# Patient Record
Sex: Male | Born: 1935
Health system: Southern US, Community
[De-identification: ages and names within clinical notes are randomized; demographics above are authoritative.]

## PROBLEM LIST (undated history)

## (undated) DIAGNOSIS — I679 Cerebrovascular disease, unspecified: Secondary | ICD-10-CM

## (undated) DIAGNOSIS — I10 Essential (primary) hypertension: Secondary | ICD-10-CM

## (undated) DIAGNOSIS — F17201 Nicotine dependence, unspecified, in remission: Secondary | ICD-10-CM

## (undated) DIAGNOSIS — N189 Chronic kidney disease, unspecified: Secondary | ICD-10-CM

## (undated) DIAGNOSIS — I2510 Atherosclerotic heart disease of native coronary artery without angina pectoris: Secondary | ICD-10-CM

## (undated) DIAGNOSIS — E785 Hyperlipidemia, unspecified: Secondary | ICD-10-CM

## (undated) DIAGNOSIS — I350 Nonrheumatic aortic (valve) stenosis: Secondary | ICD-10-CM

## (undated) DIAGNOSIS — E119 Type 2 diabetes mellitus without complications: Secondary | ICD-10-CM

## (undated) HISTORY — PX: KNEE ARTHROSCOPY: SUR90

## (undated) HISTORY — PX: TONSILLECTOMY: SUR1361

## (undated) HISTORY — PX: SHOULDER SURGERY: SHX246

## (undated) HISTORY — DX: Atherosclerotic heart disease of native coronary artery without angina pectoris: I25.10

## (undated) HISTORY — PX: CARPAL TUNNEL RELEASE: SHX101

## (undated) HISTORY — DX: Type 2 diabetes mellitus without complications: E11.9

## (undated) HISTORY — DX: Nicotine dependence, unspecified, in remission: F17.201

## (undated) HISTORY — DX: Essential (primary) hypertension: I10

## (undated) HISTORY — PX: TRANSURETHRAL INCISION OF PROSTATE: SHX2573

## (undated) HISTORY — PX: APPENDECTOMY: SHX54

## (undated) HISTORY — DX: Hyperlipidemia, unspecified: E78.5

## (undated) HISTORY — DX: Cerebrovascular disease, unspecified: I67.9

---

## 1998-12-04 ENCOUNTER — Ambulatory Visit: Admission: RE | Admit: 1998-12-04 | Discharge: 1998-12-04 | Payer: Self-pay | Admitting: Pulmonary Disease

## 2000-10-14 ENCOUNTER — Encounter: Admission: RE | Admit: 2000-10-14 | Discharge: 2000-10-14 | Payer: Self-pay

## 2000-10-15 ENCOUNTER — Encounter: Admission: RE | Admit: 2000-10-15 | Discharge: 2000-10-15 | Payer: Self-pay

## 2001-05-01 ENCOUNTER — Encounter: Payer: Self-pay | Admitting: Orthopedic Surgery

## 2001-05-05 ENCOUNTER — Ambulatory Visit (HOSPITAL_COMMUNITY): Admission: RE | Admit: 2001-05-05 | Discharge: 2001-05-05 | Payer: Self-pay | Admitting: Orthopedic Surgery

## 2002-11-22 ENCOUNTER — Other Ambulatory Visit: Admission: RE | Admit: 2002-11-22 | Discharge: 2002-11-22 | Payer: Self-pay | Admitting: Dermatology

## 2003-05-22 ENCOUNTER — Other Ambulatory Visit: Admission: RE | Admit: 2003-05-22 | Discharge: 2003-05-22 | Payer: Self-pay | Admitting: Dermatology

## 2003-08-22 ENCOUNTER — Emergency Department (HOSPITAL_COMMUNITY): Admission: EM | Admit: 2003-08-22 | Discharge: 2003-08-23 | Payer: Self-pay | Admitting: *Deleted

## 2003-09-26 ENCOUNTER — Other Ambulatory Visit: Admission: RE | Admit: 2003-09-26 | Discharge: 2003-09-26 | Payer: Self-pay | Admitting: Dermatology

## 2004-11-13 ENCOUNTER — Ambulatory Visit: Payer: Self-pay | Admitting: *Deleted

## 2004-11-20 ENCOUNTER — Ambulatory Visit: Payer: Self-pay | Admitting: *Deleted

## 2004-11-20 ENCOUNTER — Encounter (HOSPITAL_COMMUNITY): Admission: RE | Admit: 2004-11-20 | Discharge: 2004-12-20 | Payer: Self-pay | Admitting: *Deleted

## 2004-11-26 ENCOUNTER — Ambulatory Visit (HOSPITAL_COMMUNITY): Admission: RE | Admit: 2004-11-26 | Discharge: 2004-11-26 | Payer: Self-pay | Admitting: *Deleted

## 2004-11-26 ENCOUNTER — Ambulatory Visit: Payer: Self-pay | Admitting: *Deleted

## 2004-12-02 ENCOUNTER — Inpatient Hospital Stay (HOSPITAL_BASED_OUTPATIENT_CLINIC_OR_DEPARTMENT_OTHER): Admission: RE | Admit: 2004-12-02 | Discharge: 2004-12-02 | Payer: Self-pay | Admitting: *Deleted

## 2004-12-02 ENCOUNTER — Ambulatory Visit: Payer: Self-pay | Admitting: Cardiology

## 2004-12-15 ENCOUNTER — Ambulatory Visit: Payer: Self-pay | Admitting: *Deleted

## 2005-03-05 ENCOUNTER — Ambulatory Visit (HOSPITAL_COMMUNITY): Admission: RE | Admit: 2005-03-05 | Discharge: 2005-03-05 | Payer: Self-pay | Admitting: Family Medicine

## 2006-08-26 ENCOUNTER — Ambulatory Visit (HOSPITAL_COMMUNITY): Admission: RE | Admit: 2006-08-26 | Discharge: 2006-08-26 | Payer: Self-pay | Admitting: Internal Medicine

## 2007-12-31 ENCOUNTER — Emergency Department (HOSPITAL_COMMUNITY): Admission: EM | Admit: 2007-12-31 | Discharge: 2008-01-01 | Payer: Self-pay | Admitting: Emergency Medicine

## 2008-01-26 ENCOUNTER — Emergency Department (HOSPITAL_COMMUNITY): Admission: EM | Admit: 2008-01-26 | Discharge: 2008-01-27 | Payer: Self-pay | Admitting: Emergency Medicine

## 2008-08-13 ENCOUNTER — Ambulatory Visit: Payer: Self-pay | Admitting: Cardiology

## 2008-08-19 ENCOUNTER — Encounter: Payer: Self-pay | Admitting: Cardiology

## 2008-08-19 ENCOUNTER — Ambulatory Visit: Payer: Self-pay | Admitting: Cardiology

## 2008-08-19 ENCOUNTER — Ambulatory Visit (HOSPITAL_COMMUNITY): Admission: RE | Admit: 2008-08-19 | Discharge: 2008-08-19 | Payer: Self-pay | Admitting: Cardiology

## 2008-09-02 ENCOUNTER — Ambulatory Visit (HOSPITAL_COMMUNITY): Admission: RE | Admit: 2008-09-02 | Discharge: 2008-09-02 | Payer: Self-pay | Admitting: Cardiology

## 2008-09-02 ENCOUNTER — Ambulatory Visit: Payer: Self-pay | Admitting: Cardiology

## 2008-09-24 ENCOUNTER — Ambulatory Visit (HOSPITAL_COMMUNITY): Admission: RE | Admit: 2008-09-24 | Discharge: 2008-09-24 | Payer: Self-pay | Admitting: Cardiology

## 2008-11-27 ENCOUNTER — Telehealth: Payer: Self-pay | Admitting: Cardiology

## 2008-12-09 DIAGNOSIS — E785 Hyperlipidemia, unspecified: Secondary | ICD-10-CM

## 2008-12-09 DIAGNOSIS — I10 Essential (primary) hypertension: Secondary | ICD-10-CM

## 2008-12-11 ENCOUNTER — Ambulatory Visit: Payer: Self-pay | Admitting: Cardiology

## 2009-01-06 ENCOUNTER — Encounter: Payer: Self-pay | Admitting: Cardiology

## 2009-01-06 ENCOUNTER — Encounter (INDEPENDENT_AMBULATORY_CARE_PROVIDER_SITE_OTHER): Payer: Self-pay | Admitting: *Deleted

## 2009-01-06 LAB — CONVERTED CEMR LAB
Cholesterol: 250 mg/dL
HDL: 39 mg/dL
HDL: 39 mg/dL — ABNORMAL LOW (ref >39)
LDL Cholesterol: 132 mg/dL
Triglycerides: 396 mg/dL
Triglycerides: 396 mg/dL — ABNORMAL HIGH (ref <150)

## 2009-01-13 ENCOUNTER — Encounter (INDEPENDENT_AMBULATORY_CARE_PROVIDER_SITE_OTHER): Payer: Self-pay | Admitting: *Deleted

## 2009-06-02 LAB — CONVERTED CEMR LAB
ALT: 49 units/L
CO2: 30 meq/L
Chloride: 100 meq/L
Cholesterol: 266 mg/dL
HDL: 36 mg/dL
LDL Cholesterol: 161 mg/dL
Potassium: 4.3 meq/L
Total Protein: 7 g/dL
Triglycerides: 346 mg/dL

## 2009-08-26 ENCOUNTER — Encounter (INDEPENDENT_AMBULATORY_CARE_PROVIDER_SITE_OTHER): Payer: Self-pay | Admitting: *Deleted

## 2009-08-28 ENCOUNTER — Ambulatory Visit: Payer: Self-pay | Admitting: Cardiology

## 2009-08-28 ENCOUNTER — Encounter: Payer: Self-pay | Admitting: Cardiology

## 2009-08-28 ENCOUNTER — Encounter (INDEPENDENT_AMBULATORY_CARE_PROVIDER_SITE_OTHER): Payer: Self-pay | Admitting: *Deleted

## 2009-08-28 DIAGNOSIS — I2510 Atherosclerotic heart disease of native coronary artery without angina pectoris: Secondary | ICD-10-CM

## 2009-09-09 ENCOUNTER — Ambulatory Visit (HOSPITAL_COMMUNITY)
Admission: RE | Admit: 2009-09-09 | Discharge: 2009-09-09 | Payer: Self-pay | Source: Home / Self Care | Admitting: Cardiology

## 2009-09-29 ENCOUNTER — Encounter: Payer: Self-pay | Admitting: Cardiology

## 2009-09-29 LAB — CONVERTED CEMR LAB
HDL: 32 mg/dL — ABNORMAL LOW (ref >39)
LDL Cholesterol: 49 mg/dL (ref 0–99)
Total CHOL/HDL Ratio: 4.8
Triglycerides: 369 mg/dL — ABNORMAL HIGH (ref <150)
VLDL: 74 mg/dL — ABNORMAL HIGH (ref 0–40)

## 2009-10-02 ENCOUNTER — Encounter (INDEPENDENT_AMBULATORY_CARE_PROVIDER_SITE_OTHER): Payer: Self-pay | Admitting: *Deleted

## 2010-05-19 ENCOUNTER — Ambulatory Visit: Payer: Self-pay | Admitting: Internal Medicine

## 2010-06-18 ENCOUNTER — Ambulatory Visit (HOSPITAL_COMMUNITY)
Admission: RE | Admit: 2010-06-18 | Discharge: 2010-06-18 | Payer: Self-pay | Source: Home / Self Care | Attending: Internal Medicine | Admitting: Internal Medicine

## 2010-06-18 ENCOUNTER — Ambulatory Visit: Payer: Self-pay | Admitting: Internal Medicine

## 2010-07-21 NOTE — Assessment & Plan Note (Signed)
Summary: 8 MTH F/U PER CHECKOUT ON 12/11/08/TG  Medications Added PRILOSEC 20 MG CPDR (OMEPRAZOLE) Take 1 tablet by mouth once a day WELCHOL 625 MG TABS (COLESEVELAM HCL) take 3 tablets two times a day TOPROL XL 50 MG XR24H-TAB (METOPROLOL SUCCINATE) Take 1 tablet by mouth once a day ASPIRIN 325 MG TABS (ASPIRIN) Take 1 tablet by mouth once a day POLYETHYLENE GLYCOL 1000  POWD (POLYETHYLENE GLYCOL 1000) 17 grams in 8 oz water daily GLUCOPHAGE 500 MG TABS (METFORMIN HCL) Take 1 tablet by mouth two times a day VITAMIN E 1000 UNIT CAPS (VITAMIN E) take one tablet weekly CRANBERRY 500 MG CAPS (CRANBERRY) Take 1 tablet by mouth two times a day VITAMIN D3 400 UNIT TABS (CHOLECALCIFEROL) Take 1 tablet by mouth once a day COQ10 200 MG CAPS (COENZYME Q10) Take 1 tablet by mouth once a day VITAMIN C CR 1500 MG CR-TABS (ASCORBIC ACID) Take 1 tablet by mouth once a day AMLODIPINE BESY-BENAZEPRIL HCL 5-20 MG CAPS (AMLODIPINE BESY-BENAZEPRIL HCL) Take 1 tablet by mouth once a day TEKTURNA HCT 300-12.5 MG TABS (ALISKIREN-HYDROCHLOROTHIAZIDE) Take 1 tablet by mouth once a day ZETIA 10 MG TABS (EZETIMIBE) Take one tablet by mouth daily.      Allergies Added: ! NIACIN  Visit Type:  8 month follow up Primary Provider:  Dr. Delphina Cahill   History of Present Illness: Return visit for this very pleasant 77 year old gentleman with mild coronary artery disease as of catheterization in 2006.  Since his last visit, he has done well from a cardiovascular standpoint.  He has very mild chronic dyspnea on exertion.  He notes no chest discomfort.  He continues to work in a [employer] on a part-time basis without problems except for an injury he recently suffered to his right third and fourth fingers.  He has noted no edema and has had no orthopnea nor PND.  Recent lipid profile was still uncontrolled despite treatment with WelChol.  He is statin intolerant.  -  Date:  08/28/2009    PSA .46   Current Medications  (verified): 1)  Prilosec 20 Mg Cpdr (Omeprazole) .... Take 1 Tablet By Mouth Once A Day 2)  Welchol 625 Mg Tabs (Colesevelam Hcl) .... Take 3 Tablets Two Times A Day 3)  Toprol Xl 50 Mg Xr24h-Tab (Metoprolol Succinate) .... Take 1 Tablet By Mouth Once A Day 4)  Aspirin 325 Mg Tabs (Aspirin) .... Take 1 Tablet By Mouth Once A Day 5)  Polyethylene Glycol 1000  Powd (Polyethylene Glycol 1000) .Marland KitchenMarland KitchenMarland Kitchen 17 Grams in 8 Oz Water Daily 6)  Glucophage 500 Mg Tabs (Metformin Hcl) .... Take 1 Tablet By Mouth Two Times A Day 7)  Vitamin E 1000 Unit Caps (Vitamin E) .... Take One Tablet Weekly 8)  Cranberry 500 Mg Caps (Cranberry) .... Take 1 Tablet By Mouth Two Times A Day 9)  Vitamin D3 400 Unit Tabs (Cholecalciferol) .... Take 1 Tablet By Mouth Once A Day 10)  Coq10 200 Mg Caps (Coenzyme Q10) .... Take 1 Tablet By Mouth Once A Day 11)  Vitamin C Cr 1500 Mg Cr-Tabs (Ascorbic Acid) .... Take 1 Tablet By Mouth Once A Day 12)  Amlodipine Besy-Benazepril Hcl 5-20 Mg Caps (Amlodipine Besy-Benazepril Hcl) .... Take 1 Tablet By Mouth Once A Day 13)  Tekturna Hct 300-12.5 Mg Tabs (Aliskiren-Hydrochlorothiazide) .... Take 1 Tablet By Mouth Once A Day 14)  Zetia 10 Mg Tabs (Ezetimibe) .... Take One Tablet By Mouth Daily.  Allergies (verified): 1)  ! Pravachol 2)  !  Vytorin (Ezetimibe-Simvastatin) 3)  ! Codeine 4)  ! Naprosyn 5)  ! Niacin  Past History:  PMH, FH, and Social History reviewed and updated.  Review of Systems       See history of present illness.  Vital Signs:  Patient profile:   77 year old male Height:      66.5 inches Weight:      186 pounds BMI:     29.68 O2 Sat:      96 % on Room air Pulse rate:   84 / minute BP sitting:   149 / 73  (left arm)  Vitals Entered By: Tye Savoy RN (August 28, 2009 10:55 AM)  O2 Flow:  Room air  Physical Exam  General:  Older, but very sharp gentleman, who is somewhat hard of hearing and in no acute distress. Weight is 186, 9 pounds more than at  his last visit. NECK:  No jugular venous distention; normal carotid upstrokes with soft bruits vs. transmitted murmur bilaterally LUNGS:  Clear with some prolongation of the expiratory phase. CARDIAC:  Normal first and second heart sounds; fourth heart sound present; grade 2/6 systolic ejection murmur at the cardiac base, but heard across the precordium ABDOMEN:  Soft and nontender; normal bowel sounds; no bruits; no organomegaly. EXTREMITIES:  trace pretibial edema; distal pulses intact; bandaged distal right third and fourth fingers. Neurologic: Normal strength and tone; normal cranial nerves Skin:  no significant lesions   Impression & Recommendations:  Problem # 1:  ATHEROSCLEROTIC CARDIOVASCULAR DISEASE (ICD-429.2) No symptoms to suggest myocardial ischemia.  No specific testing nor treatment is warranted at the present time.  Problem # 2:  HYPERTENSION (ICD-401.9) Blood pressure is slightly high in the office, the patient reports systolics only in the low 130s at home.  This represents adequate control for a diabetic.  Current medication will be continued.  Problem # 3:  HYPERLIPIDEMIA (B2193296.4) His degree of hyperlipidemia will be difficult to manage without statins.  Dr. Nevada Crane has increased his dose of WelChol.  Ezetimibe will be added at a dose of 10 mg q.d.  Lipids will be repeated in one month.  Problem # 4:  CEREBROVASCULAR DISEASE (ICD-437.9) Findings over his carotids likely represent transmitted murmur; however, he has no documented examination of anatomy.  A duplex study will be obtained.  Absent any major problems in his tests or new symptoms, I will plan to see this nice gentleman in one year.  Other Orders: Carotid Duplex (Carotid Duplex) Future Orders: T-Lipid Profile [id number]) ... 09/29/2009  Patient Instructions: 1)  Your physician recommends that you schedule a follow-up appointment in: 1 year 2)  Your physician recommends that you return for lab work in:  1 month 3)  Your physician has requested that you have a carotid duplex. This test is an ultrasound of the carotid arteries in your neck. It looks at blood flow through these arteries that supply the brain with blood. Allow one hour for this exam. There are no restrictions or special instructions. 4)  Your physician has recommended you make the following change in your medication: ZETIA 10MG  DAILY1

## 2010-07-21 NOTE — Letter (Signed)
Summary: [facility], general practice at [place]. [address], [place] [address]   Phone: [phone]  Fax: [phone]     August 28, 2009 MRN: [id number]   Anthony Murray [address] [place]  [address]      YOUR LAB WORK IS DUE   ____________APRIL 11, 2011_____________________________  Please go to [facility], located across the street from [facility] on the second floor.  Hours are Monday - Friday 7am until 7:30pm         Saturday 8am until 12noon    _X_  DO NOT EAT OR DRINK AFTER MIDNIGHT EVENING PRIOR TO LABWORK  __ YOUR LABWORK IS NOT FASTING --YOU MAY EAT PRIOR TO LABWORK

## 2010-07-21 NOTE — Letter (Signed)
Summary: [facility], general practice at [place]. [address] [place] [address]   Phone: [phone]  Fax: [phone]      October 02, 2009 MRN: [id number]   TOMMASO KER [address] [place], [place]  [address]   Dear Mr. Housman,  Your test ordered by Rande Lawman has been reviewed by your physician (or physician assistant) and was found to be normal or stable. Your physician (or physician assistant) felt no changes were needed at this time.  ____ Echocardiogram  ____ Cardiac Stress Test  __x__ Lab Work  ____ Peripheral vascular study of arms, legs or neck  ____ CT scan or X-ray  ____ Lung or Breathing test  ____ Other:  No change in medical treatment at this time, per Dr. Lattie Haw.  Enclosed is a copy of your labwork for your records.  Thank you, Jovanna Hodges [facility] RN    [name], MD, [facility] Gamer.C.Renella Cunas, MD, F.A.C.C Cristopher Peru, MD, F.A.C.C Rozann Lesches, MD, F.A.C.C Jenkins Rouge, MD, Le[facility] Gamer.C.C

## 2010-07-21 NOTE — Miscellaneous (Signed)
Summary: LABS LIPIDS,01/06/2009  Clinical Lists Changes  Observations: Added new observation of LDL: 132 mg/dL (01/06/2009 10:11) Added new observation of HDL: 39 mg/dL (01/06/2009 10:11) Added new observation of TRIGLYC TOT: 396 mg/dL (01/06/2009 10:11) Added new observation of CHOLESTEROL: 250 mg/dL (01/06/2009 10:11)

## 2010-09-09 ENCOUNTER — Ambulatory Visit: Payer: Self-pay | Admitting: Cardiology

## 2010-09-30 ENCOUNTER — Ambulatory Visit (INDEPENDENT_AMBULATORY_CARE_PROVIDER_SITE_OTHER): Payer: 59 | Admitting: Cardiology

## 2010-09-30 ENCOUNTER — Encounter: Payer: Self-pay | Admitting: Cardiology

## 2010-09-30 DIAGNOSIS — I10 Essential (primary) hypertension: Secondary | ICD-10-CM

## 2010-09-30 DIAGNOSIS — I2510 Atherosclerotic heart disease of native coronary artery without angina pectoris: Secondary | ICD-10-CM

## 2010-09-30 DIAGNOSIS — I679 Cerebrovascular disease, unspecified: Secondary | ICD-10-CM

## 2010-09-30 DIAGNOSIS — E785 Hyperlipidemia, unspecified: Secondary | ICD-10-CM

## 2010-09-30 DIAGNOSIS — E118 Type 2 diabetes mellitus with unspecified complications: Secondary | ICD-10-CM | POA: Insufficient documentation

## 2010-09-30 DIAGNOSIS — E119 Type 2 diabetes mellitus without complications: Secondary | ICD-10-CM

## 2010-09-30 DIAGNOSIS — F17201 Nicotine dependence, unspecified, in remission: Secondary | ICD-10-CM

## 2010-09-30 DIAGNOSIS — Z87891 Personal history of nicotine dependence: Secondary | ICD-10-CM

## 2010-09-30 LAB — BLOOD GAS, ARTERIAL
Acid-Base Excess: 0.4 mmol/L (ref 0.0–2.0)
Bicarbonate: 24.6 mEq/L — ABNORMAL HIGH (ref 20.0–24.0)
pCO2 arterial: 40.4 mmHg (ref 35.0–45.0)
pO2, Arterial: 80.6 mmHg (ref 80.0–100.0)

## 2010-09-30 NOTE — Assessment & Plan Note (Addendum)
No clinical evidence for progression of coronary disease over the past 6 years.  We will continue to optimally manage vascular risk factors.

## 2010-09-30 NOTE — Progress Notes (Signed)
HPI : Mr. Rossen returns to the office as scheduled for continued assessment and treatment of hypertension, vascular disease and cardiovascular risk factors.  Since his last visit, he has been generally well.  His level of activity is somewhat decreased, but he continues to garden and perform yardwork.  He denies dyspnea, orthopnea, PND, chest pain and syncope.  Current Outpatient Prescriptions on File Prior to Visit  Medication Sig Dispense Refill  . amLODipine-benazepril (LOTREL) 5-20 MG per capsule Take 1 capsule by mouth daily.        . Ascorbic Acid (VITAMIN C CR) 1500 MG TBCR Take 1 tablet by mouth daily.        Marland Kitchen aspirin 325 MG tablet Take 325 mg by mouth daily.        . Cholecalciferol (VITAMIN D3) 400 UNITS CAPS Take 1 capsule by mouth daily.        . Cranberry 500 MG CAPS Take 1 capsule by mouth daily.        . metFORMIN (GLUCOPHAGE) 500 MG tablet Take 500 mg by mouth 2 (two) times daily with a meal.        . metoprolol (TOPROL-XL) 50 MG 24 hr tablet Take 50 mg by mouth daily.        Marland Kitchen omeprazole (PRILOSEC) 20 MG capsule Take 20 mg by mouth daily.        . Polyethylene Glycol 1000 POWD by Does not apply route.        . vitamin E 1000 UNIT capsule Take 1,000 Units by mouth daily.        . Coenzyme Q10 (COQ10) 200 MG CAPS Take 1 capsule by mouth daily.        . colesevelam (WELCHOL) 625 MG tablet Take 1,875 mg by mouth daily.        Marland Kitchen ezetimibe (ZETIA) 10 MG tablet Take 10 mg by mouth daily.        Marland Kitchen DISCONTD: Aliskiren-Hydrochlorothiazide (TEKTURNA HCT) 300-12.5 MG TABS Take 1 tablet by mouth daily.           Allergies  Allergen Reactions  . Codeine   . Ezetimibe-Simvastatin     REACTION: myalgias  . Naproxen     REACTION: and all nonsteroidals  . Niacin   . Pravastatin Sodium     REACTION: myalgias no      Past medical history, social history, and family history reviewed and updated.  ROS: See history of present illness.  PHYSICAL EXAM: BP 138/67  Pulse 79  Ht 5\' 6"   (1.676 m)  Wt 179 lb (81.194 kg)  BMI 28.89 kg/m2  General-Well developed; no acute distress Body habitus-mildly overweight, but weight has decreased 7 pounds since his last visit Neck-No JVD; left greater than right carotid bruits vs. transmitted murmur Lungs-clear lung fields; resonant to percussion Cardiovascular-normal PMI; normal S1 and S2; grade 2/6 systolic ejection murmur at the cardiac base Abdomen-normal bowel sounds; soft and non-tender without masses or organomegaly Musculoskeletal-No deformities, no cyanosis or clubbing Neurologic-Normal cranial nerves; symmetric strength and tone Skin-Warm, no significant lesions Extremities-distal pulses intact; trace edema   ASSESSMENT AND PLAN:

## 2010-09-30 NOTE — Patient Instructions (Signed)
Your physician recommends that you schedule a follow-up appointment in 1 YEAR.  

## 2010-09-30 NOTE — Assessment & Plan Note (Addendum)
Hypertension has been well-controlled with current medical therapy.  A chemistry profile was checked 2 months ago with excellent results.  Patient has significant microalbuminemia and is receiving appropriate treatment with an ACE inhibitor.

## 2010-09-30 NOTE — Assessment & Plan Note (Addendum)
Lipid profile was excellent one year ago with total cholesterol 155, triglycerides of 369, HDL 32 and LDL of 49.   More recently, suboptimal values were obtained by Dr. Nevada Crane with total cholesterol of 225, triglycerides of 422, HDL of 29 and LDL unable to be calculated.  Patient reports that WelChol and ezetimibe both caused myalgias prompting him to stop taking them.  It does not appear that we will be able to maintain pharmacologic therapy.

## 2010-09-30 NOTE — Assessment & Plan Note (Signed)
Physical findings in neck may well reflect transmitted murmur rather than cerebrovascular disease.

## 2010-11-03 NOTE — Letter (Signed)
September 02, 2008    Delphina Cahill, MD  [address] [place],  [place] [address]   RE:  Anthony Murray, Anthony Murray  MRN:  [id number]  /  DOB:  June 18, 1928   Dear Thedore Mins,   Ms. Curley returns for continued assessment and treatment of exertional  dyspnea.  Since his last visit, he feels substantially better.  His  wheezing has resolved.  He still has dyspnea with moderate exertion and  maintains a low level of activity.   Medications are unchanged from his last visit except for either the  addition or the current recognition of metformin 500 mg b.i.d., vitamin  D daily.  Pravastatin and Symbicort have been added.  He has symptoms of  prostatism and has tried Flomax and terazosin and without benefit.  She  does well on Uroxatral, but his insurance company refuses to pay for.  He has had no further chest discomfort.   PHYSICAL EXAMINATION:  GENERAL:  Pleasant, trim gentleman in no acute  distress.  VITAL SIGNS:  The weight is 181, 3 pounds more than at his last visit.  Blood pressure 150/90, heart rate 80 and regular, respirations 16.  NECK:  No jugular venous distention; no carotid bruits.  LUNGS:  Clear with modest prolongation of the expiratory phase.  ABDOMEN:  Soft and nontender; no organomegaly; no bruits.  EXTREMITIES:  No edema.   IMPRESSION:  Mr. Baumhardt is doing generally well.  His exertional  dyspnea is likely due to physical deconditioning.  He does report  impaired exercise capacity for his entire life, which has been  attributed in the past to his prematurity.  This could have caused an  abnormality in lung development.  He also has a 30 pack-year history of  cigarette smoking.  We will proceed with a chest x-ray and pulmonary  function tests.  I will check a lipid profile in 1 month and plan a  return visit in 2 months.  You may wish to specifically request  Uroxatral from his insurance company with no substitutions so that they  will consider paying for it.  Blood pressure control is  slightly  suboptimal.  I would recommend that she increase amlodipine and/or  hydrochlorothiazide dose at his next visit.   Thanks so much for allowing me to share in the care of this very nice  gentleman and his wife.    Sincerely,      Cristopher Estimable. Lattie Haw, MD, [facility]  Electronically Signed    RMR/MedQ  DD: 09/02/2008  DT: 09/03/2008  Job #: [id number]

## 2010-11-03 NOTE — Letter (Signed)
December 11, 2008    Delphina Cahill, MD  [address] [place],  [place] [address]   RE:  NAOD, RUANE  MRN:  [id number]  /  DOB:  1927-10-18   Dear Thedore Mins:   Mr. Reppucci returns to the office for continued assessment and treatment  of mild coronary disease, at least when last assessed in 2006,  cardiovascular risk factors, and exertional dyspnea.  Since his last  visit, he has done well.  Remarkably, he still works, [employer].  He does not get much aerobic  exercise.  He still notes some dyspnea on exertion, but says this is  improved and tolerable.  He has had no chest discomfort.  He was unable  to take pravastatin due to myalgias.  He has had good control of  diabetes as assessed by hemoglobin A1c in your office; however,  capillary blood glucoses are 140 - 170 at home, and his triglycerides  have been high.   Current cardiac medications include:  1. Toprol 50 mg daily.  2. Aspirin 325 mg daily.  3. Fish oil 1000 mg daily.  4. Metformin 500 mg b.i.d.  5. Amlodipine/benazepril 5/20 mg daily.  6. Tekturna/HCT 300/12.5 mg daily.  7. Symbicort 1 inhalation b.i.d.   PHYSICAL EXAMINATION:  GENERAL:  Older, but very sharp gentleman, who is  somewhat hard of hearing and in no acute distress.  VITAL SIGNS:  The weight is 177, 4 pounds less than at his last visit.  Blood pressure 135/75, heart rate 68 and regular, respirations 12 and  unlabored.  NECK:  No jugular venous distention; normal carotid upstrokes without  bruits.  LUNGS:  Clear with some prolongation of the expiratory phase.  CARDIAC:  Normal first and second heart sounds; fourth heart sound  present.  ABDOMEN:  Soft and nontender; normal bowel sounds; no bruits; no  organomegaly.  EXTREMITIES:  1+ pretibial edema.   Chest x-ray was unremarkable.  PFTs showed relatively good lung function  with a normal arterial blood gas and normal flow parameters except at  low lung volumes.   Recent laboratory is from earlier this year showing a  normal CBC, normal TSH, and suboptimal lipid profile with total  cholesterol of 203, triglycerides of 400, HDL of 30, and LDL of 93.   IMPRESSION:  Mr. Gabin is doing well overall.  It appears that he will  not tolerate any statin.  We will start gemfibrozil 600 mg b.i.d.  I  emphasized to him that this does not cause muscle problems.  A lipid  profile will be repeated in 1 month.   Hypertension is as well controlled as I have seen it.  He will continue  his current medications.  I do not think that his dyspnea represents a  ischemic equivalent.  I have recommended that he try to perform aerobic  exercise on a daily basis.  Otherwise, he will follow up with you for  his other medical problems and plan to see me again in 8 months.    Sincerely,      Cristopher Estimable. Lattie Haw, MD, [facility]  Electronically Signed    RMR/MedQ  DD: 12/11/2008  DT: 12/12/2008  Job #: [id number]

## 2010-11-03 NOTE — Letter (Signed)
August 13, 2008    Anthony Murray, [address]  [place], [place] [address]   RE:  Anthony Murray, Anthony Murray  MRN:  [id number]  /  DOB:  October 11, 1927   Dear Anthony Murray,   It was my pleasure evaluating Anthony Murray in the office today in  consultation at your request for chest discomfort.  As you know, this  nice gentleman has been seen intermittently by a number of   cardiologists including Dr. Domenic Murray, Dr. Wilhemina Murray, and Dr. Albertine Murray.  He  has undergone coronary angiography on a number of occasions, most  recently in 2006 at which time he had a 60% LAD lesion and a 60%  circumflex lesion with a stress test suggesting critical right coronary  artery stenosis.  Medical therapy was advised.  He initially did well  and was not seen subsequently by any of our cardiologists.  In recent  weeks, he describes exertional chest discomfort.  This is a vaguely  characterized upper substernal ache, sometimes with radiation to the  left shoulder.  There is associated dyspnea, but he notes that he is  dyspneic all the time.  He has apparently been seen by our pulmonologist  in the past with no specific diagnosis identified.  He has smoked  cigarettes in the past, but not since 1971.  He has no known lung  conditions.  He does have hypertension that has been well controlled  medically.  He has dyslipidemia, but developed an adverse reaction to  Vytorin and is currently under no pharmacologic therapy.   PAST MEDICAL HISTORY:  Otherwise, notable for a diagnosis of diabetes  that is treated with diet.   ALLERGIES:  He reports an allergy to CODEINE and NONSTEROIDALS.   CURRENT MEDICATIONS:  1. Aspirin 325 mg daily.  2. Nortriptyline 50 mg daily.  3. Fish oil 1000 mg daily.  4. Toprol 50 mg daily.  5. Omeprazole 20 mg daily.  6. Amlodipine/benazepril 5/20 mg daily.  7. Coenzyme Q10.  8. Tekturna HCT 300/12.5 mg daily.   SOCIAL HISTORY:  Retired; married and lives locally; 4 adult children.  No  excessive use of alcohol.   FAMILY HISTORY:  No prominent coronary artery disease.   REVIEW OF SYSTEMS:  Notable for the need for corrective lenses, prior  bilateral cataract surgery, some hearing impairment, a history of heart  murmur, a history of jaundice, and arthritic discomfort.  All other  systems reviewed and are negative.   On exam, pleasant, proportionate gentleman in no acute distress.  The weight is 185.  Blood pressure 130/75, heart rate 80 and regular,  respirations 12 and unlabored.  NECK:  No jugular venous distention; faint early carotid bruits.  CARDIAC:  Normal first and second heart sounds.  Minimal early systolic  murmur.  ABDOMEN:  No bruits; normal bowel sounds; no organomegaly.  EXTREMITIES:  No edema.  NEUROLOGIC:  Symmetric strength and tone; normal cranial nerves.   Most recent laboratories are from mid 2009, at which time chemistry  profile was normal, A1c was normal, LFTs were normal, and lipid profile  was suboptimal with total cholesterol of 238, triglycerides of 263, HDL  of 37, and LDL of 149.   IMPRESSION:  Anthony Murray has impressive symptoms that are certainly  consistent with exertional angina; however, he has had multiple  evaluations in the past for similar chest discomfort, all without  requiring revascularizations.  Accordingly, I believe a stress test is  the appropriate in  initial study.  He had a bad experience with a  treadmill test many years ago, but is willing to try again.  We will set  that study up at the time convenient to him.   He is on a fairly good anti-ischemic regime with amlodipine and  metoprolol.  We will not titrate those drugs for the time being.   He has known coronary artery disease and probable cerebrovascular  disease and merits lipid lowering therapy.  We will try a low dose of  pravastatin every other day to determine if he can tolerate any statin.  I will see this nice gentleman again after a stress test has  been  completed.  Thank so much for sending him back to [place] for reevaluation.    Sincerely,      Anthony Estimable. Lattie Haw, MD, [facility]  Electronically Signed    RMR/MedQ  DD: 08/13/2008  DT: 08/14/2008  Job #: [id number]

## 2010-11-03 NOTE — Procedures (Signed)
NAMESAYEED, LIPE NO.:  [id number]   MEDICAL RECORD NO.:  [id number]          PATIENT TYPE:  OUT   LOCATION:  RAD                           FACILITY:  [facility]   PHYSICIAN:  Cristopher Estimable. Lattie Haw, MD, FACCDATE OF BIRTH:  [birth year]   DATE OF PROCEDURE:  08/19/2008  DATE OF DISCHARGE:                                ECHOCARDIOGRAM   REFERRING DOCTOR:  Delphina Cahill, MD   CLINICAL DATA:  A 77 year old gentleman with chest pain and multiple  cardiovascular risk factors.  1. Treadmill exercise performed to work load of 5 METs and a heart      rate of 131, 92% of age-predicted maximum.  Exercise discontinued      due to dyspnea; audible wheezing appreciated.  2. Blood pressure increased from resting value of 130/70 to 180/80      during exercise and 190/70 in recovery, a normal response.      Occasional PVCs occurred during exercise and moderately frequent      PVCs early in recovery.  3. EKG:  Normal sinus rhythm; borderline left atrial abnormality;      right ventricular conduction delay; delayed R-wave progression.   Stress EKG:  No significant change.  1. Baseline echocardiogram:  Normal left atrial size; normal mitral      and tricuspid valve; normal left ventricular size and function;      mild-to-moderate LVH.   Post-exercise echocardiogram:  Increase contractility in all myocardial  segments except for the proximal septum, which was unchanged.   IMPRESSION:  Probably negative stress echocardiogram revealed impaired  exercise capacity, a rapid increase in the heart rate at low level  exercise consistent with physical deconditioning, normal left  ventricular size, normal left ventricular systolic function and no  definite electrocardiographic nor echocardiographic evidence for  ischemia or infarction.      Cristopher Estimable. Lattie Haw, MD, [facility]  Electronically Signed     RMR/MEDQ  D:  08/20/2008  T:  08/20/2008  Job:  [id number]

## 2010-11-06 NOTE — Op Note (Signed)
[facility]  Patient:    Anthony Murray, Anthony Murray Visit Number: [id number] MRN: [id number]          Service Type: DSU Location: DAY Attending Physician:  Augustin Schooling. Dictated by:   Doran Heater Veverly Fells, M.D. Proc. Date: 05/05/01 Admit Date:  05/05/2001                             Operative Report  PREOPERATIVE DIAGNOSIS:  Left carpal tunnel syndrome.  POSTOPERATIVE DIAGNOSIS:  Left carpal tunnel syndrome.  PROCEDURE PERFORMED:  Left carpal tunnel release.  ATTENDING SURGEON:  Doran Heater. Veverly Fells, M.D.  ANESTHESIA:  Local plus MAC was used.  ESTIMATED BLOOD LOSS:  Minimal.  TOURNIQUET TIME:  18 minutes.  INSTRUMENT COUNT:  Correct.  COMPLICATIONS:  There were no complications.  FLUID REPLACEMENT:  400 cc of crystalloid.  INDICATIONS:  The patient is a 77 year old male who presents with long-standing left carpal tunnel symptoms.  He has numbness and tingling in his radial 3-1/2 digits.  This has been refractory to splinting.  He has significant night pain and interferes with activities of daily living.  After discussing the options with the patient, including continued nonoperative treatment versus surgical carpal tunnel release, he would like to have his carpal tunnel release.  An informed consent was signed and on the chart.  DESCRIPTION OF PROCEDURE:  After an adequate level of local anesthesia plus MAC was achieved, the patient was positioned supine on the operating table, and a nonsterile tourniquet was placed on the left proximal arm.  The left arm was then prepped and draped in its entirity in the usual sterile fashion.  An Esmarch bandage was used to exsanguinate the left upper extremity. The tourniquet was elevated to 250 mmHg.  A longitudinal incision was created in line with the fourth ray in the palm.  This was done using a 15 blade scalpel. Dissection was carried sharply through the subcutaneous tissues.  Using the knife, the  superficial palmar fascia was released in line with the skin incision.  Next, the transverse carpal ligament was identified and incised. This was done under loupe magnification.  The median nerve was protected during this portion of the case.  The operating surgeons finger was then placed into the palm to ensure complete release distally.  Care was taken to preserve the palmar arch blood supply.  Next, attention was directed proximally.  The transverse carpal ligament was released all to its junction with the ______ fascia, which was also incised, again with great care towards protecting the median nerve.  This was visualized during the entire portion of the procedure.  After complete release, the finger was then placed into forearm from the palmar incision. The wound was thoroughly irrigated and then closed using interrupted 4-0 nylon vertical mattress sutures.  A sterile dressing was applied, followed by a short arm splint with the wrist in slight dorsiflexion. The patient tolerated the procedure well and was taken to PACU in stable condition. Dictated by:   Doran Heater Veverly Fells, M.D. Attending Physician:  Esmond Plants R. DD:  05/05/01 TD:  05/05/01 Job: [id number]

## 2010-11-06 NOTE — Procedures (Signed)
NAMEBOHDEN, MAUPIN               ACCOUNT NO.:  [id number]   MEDICAL RECORD NO.:  [id number]           PATIENT TYPE:   LOCATION:                                 FACILITY:   PHYSICIAN:  Edward L. Luan Pulling, M.D.DATE OF BIRTH:  06/24/1927   DATE OF PROCEDURE:  DATE OF DISCHARGE:                            PULMONARY FUNCTION TEST   1. Spirometry shows no ventilatory defect, but does show some evidence      of airflow obstruction which is most marked in the smaller airways.  2. Lung volumes are normal.  3. DLCO is normal.  4. Arterial blood gases are normal.      Edward L. Luan Pulling, M.D.  Electronically Signed     ELH/MEDQ  D:  09/30/2008  T:  09/30/2008  Job:  [id number]   cc:   Cristopher Estimable. Lattie Haw, [place], [place]  [place], [place] [address]

## 2010-11-06 NOTE — Procedures (Signed)
NAME:  Anthony Murray, Anthony Murray NO.:  [id number]   MEDICAL RECORD NO.:  [id number]            PATIENT TYPE:   LOCATION:                                 FACILITY:   PHYSICIAN:  Scarlett Presto, M.D.   DATE OF BIRTH:  1928-06-02   DATE OF PROCEDURE:  11/20/2004  DATE OF DISCHARGE:                                    STRESS TEST   HISTORY OF PRESENT ILLNESS:  Anthony Murray is a 77 year old gentleman with  nonobstructive coronary artery disease by catheterization in 1999 or 2000.  He now presents with complaints of chest discomfort.   BASELINE DATA:  Electrocardiogram reveals a sinus bradycardia at 50 beats  per minute with some nonspecific ST abnormalities consistent with his  previous electrocardiogram done in the office Nov 13, 2004. Blood pressure  is 152/78.   Forty-five milligrams of adenosine was infused over 4 minutes protocol with  Myoview injected at 3 minutes. The patient reported chest tightness and  shortness of breath that resolved in recovery. EKG revealed sinus  bradycardia. No ischemic changes were noted.   Final images and results are pending M.D. review.      AB/MEDQ  D:  11/20/2004  T:  11/21/2004  Job:  [id number]

## 2010-11-06 NOTE — Cardiovascular Report (Signed)
[name], Anthony Murray               ACCOUNT NO.:  [id number]   MEDICAL RECORD NO.:  [id number]          PATIENT TYPE:  OIB   LOCATION:  6501                         FACILITY:  [facility]   PHYSICIAN:  Ethelle Lyon, M.D. LHCDATE OF BIRTH:  [birth year]   DATE OF PROCEDURE:  12/02/2004  DATE OF DISCHARGE:                              CARDIAC CATHETERIZATION   PROCEDURE:  Left heart catheterization, coronary angiography.   INDICATION:  [name] is a 77 year old gentleman with previously  documented nonobstructive coronary disease in the setting of hypertension,  dyslipidemia and diabetes mellitus.  He is stated to have dextrocardia in  the setting of situs inversus and previously placed coronary stent.  The  patient denies any prior cardiac stenting, in contrast to the written  record.  He has no symptoms of angina.  Does have some mild exertional  dyspnea.  He underwent adenosine Cardiolite in mid May demonstrating a  reversible inferior defect.  He was, therefore, referred for diagnostic  angiography.   PROCEDURAL TECHNIQUE:  Informed consent was obtained.  Under 1% lidocaine  local anesthesia, a 4 French sheath was placed in the right common femoral  artery using the modified Seldinger technique.  Diagnostic angiography and  left heart catheterization were performed using JL4, JR4, and pigtail  catheters.  Ventriculography was deferred due to renal insufficiency in the  setting of diabetes mellitus.  The patient tolerated the procedure well and  was transferred to the holding room in stable condition.  Sheaths will be  removed there.   COMPLICATIONS:  None.   FINDINGS:  1.  Patient does not have dextrocardia.  The heart is normally located in      the left side of the chest.  Aortic arch appears normal based on the      course of the catheter through it.  2.  LV 167/19/25.  3.  No aortic stenosis.  4.  Left main:  Angiographically normal.  5.  LAD:  Large vessel giving rise to  a single, very large, diagonal.  There      is a 50% stenosis of the ostium of the LAD.  6.  Circumflex:  Large vessel giving rise to three obtuse marginals.  There      is a 60% stenosis of the mid vessel.  7.  RCA:  Tortuous, dominant vessel.  It is angiographically normal.   IMPRESSION:  1.  No dextrocardia.  2.  No previously placed coronary stent.  3.  Moderate nonobstructive coronary disease.  4.  Elevated left ventricular end diastolic pressure in the setting of      systemic hypertension.   RECOMMENDATIONS:  Continue medical therapy with emphasis on control of his  blood pressure.       WED/MEDQ  D:  12/02/2004  T:  12/02/2004  Job:  [id number]   cc:   Scarlett Presto, M.D.  Fax: [phone]

## 2011-09-09 ENCOUNTER — Other Ambulatory Visit (HOSPITAL_COMMUNITY): Payer: Self-pay | Admitting: Internal Medicine

## 2011-09-09 ENCOUNTER — Ambulatory Visit (HOSPITAL_COMMUNITY)
Admission: RE | Admit: 2011-09-09 | Discharge: 2011-09-09 | Disposition: A | Discharge status: Home / Self Care | Payer: Medicare Other | Source: Ambulatory Visit | Attending: Internal Medicine | Admitting: Internal Medicine

## 2011-09-09 DIAGNOSIS — M79606 Pain in leg, unspecified: Secondary | ICD-10-CM

## 2011-09-09 DIAGNOSIS — I739 Peripheral vascular disease, unspecified: Secondary | ICD-10-CM

## 2011-09-09 DIAGNOSIS — M79609 Pain in unspecified limb: Secondary | ICD-10-CM | POA: Insufficient documentation

## 2011-10-05 ENCOUNTER — Ambulatory Visit (INDEPENDENT_AMBULATORY_CARE_PROVIDER_SITE_OTHER): Payer: Medicare Other | Admitting: Cardiology

## 2011-10-05 ENCOUNTER — Encounter: Payer: Self-pay | Admitting: Cardiology

## 2011-10-05 VITALS — BP 149/88 | HR 91 | Resp 18 | Ht 66.0 in | Wt 180.0 lb

## 2011-10-05 DIAGNOSIS — I679 Cerebrovascular disease, unspecified: Secondary | ICD-10-CM

## 2011-10-05 DIAGNOSIS — N189 Chronic kidney disease, unspecified: Secondary | ICD-10-CM

## 2011-10-05 DIAGNOSIS — I739 Peripheral vascular disease, unspecified: Secondary | ICD-10-CM

## 2011-10-05 DIAGNOSIS — E119 Type 2 diabetes mellitus without complications: Secondary | ICD-10-CM

## 2011-10-05 DIAGNOSIS — I10 Essential (primary) hypertension: Secondary | ICD-10-CM

## 2011-10-05 DIAGNOSIS — I2510 Atherosclerotic heart disease of native coronary artery without angina pectoris: Secondary | ICD-10-CM

## 2011-10-05 DIAGNOSIS — E785 Hyperlipidemia, unspecified: Secondary | ICD-10-CM

## 2011-10-05 MED ORDER — AMLODIPINE BESY-BENAZEPRIL HCL 5-20 MG PO CAPS: 1.0000 | ORAL_CAPSULE | Freq: Two times a day (BID) | ORAL | Status: AC

## 2011-10-05 NOTE — Assessment & Plan Note (Addendum)
No neurologic symptoms to suggest brain ischemia or infarction.  Risk factor control and treatment with aspirin will be continued.  Aspirin dose of 81 mg should be adequate.

## 2011-10-05 NOTE — Assessment & Plan Note (Signed)
No symptoms to suggest myocardial ischemia.  We will continue to endeavor to manage cardiovascular risk factors to the extent possible.

## 2011-10-05 NOTE — Patient Instructions (Signed)
Your physician recommends that you schedule a follow-up appointment in: 1 year  Your physician has recommended you make the following change in your medication:  1 - INCREASE Amlodipine/benazepril to 1 twice a day 2 - CHANGE Aspirin to 81 mg daily

## 2011-10-05 NOTE — Progress Notes (Deleted)
Name: Anthony Murray    DOB: [birth year]  Age: 78 y.o.  MR#: [id number]       PCP:  Wende Neighbors, MD, MD      Insurance: @PAYORNAME @   CC:    Chief Complaint  Patient presents with  . Appointment    SOB +med list    VS BP 149/88  Pulse 91  Resp 18  Ht 5\' 6"  (1.676 m)  Wt 180 lb (81.647 kg)  BMI 29.05 kg/m2  Weights Current Weight  10/05/11 180 lb (81.647 kg)  09/30/10 179 lb (81.194 kg)  08/28/09 186 lb (84.369 kg)    Blood Pressure  BP Readings from Last 3 Encounters:  10/05/11 149/88  09/30/10 138/67  08/28/09 149/73     Admit date:  (Not on file) Last encounter with RMR:  Visit date not found   Allergy Allergies  Allergen Reactions  . Codeine   . Ezetimibe-Simvastatin     REACTION: myalgias  . Naproxen     REACTION: and all nonsteroidals  . Niacin   . Pravastatin Sodium     REACTION: myalgias no    Current Outpatient Prescriptions  Medication Sig Dispense Refill  . amLODipine-benazepril (LOTREL) 5-20 MG per capsule Take 1 capsule by mouth daily.        Marland Kitchen aspirin 325 MG tablet Take 325 mg by mouth daily.        . Brimonidine Tartrate-Timolol (COMBIGAN OP) Apply 1 drop to eye 2 (two) times daily. 1 drop both eyes bid        . dorzolamide (TRUSOPT) 2 % ophthalmic solution Place 1 drop into the left eye 2 (two) times daily.        Marland Kitchen latanoprost (XALATAN) 0.005 % ophthalmic solution Place 1 drop into both eyes at bedtime.        Marland Kitchen losartan-hydrochlorothiazide (HYZAAR) 100-12.5 MG per tablet Take 1 tablet by mouth daily.        . metFORMIN (GLUCOPHAGE) 850 MG tablet Take 850 mg by mouth 2 (two) times daily with a meal.      . metoprolol (TOPROL-XL) 50 MG 24 hr tablet Take 50 mg by mouth daily.        Marland Kitchen omeprazole (PRILOSEC) 20 MG capsule Take 20 mg by mouth daily.          Discontinued Meds:    Medications Discontinued During This Encounter  Medication Reason  . metFORMIN (GLUCOPHAGE) 500 MG tablet Error  . Ascorbic Acid (VITAMIN C CR) 1500 MG TBCR Error  .  Cholecalciferol (VITAMIN D3) 400 UNITS CAPS Error  . Coenzyme Q10 (COQ10) 200 MG CAPS Error  . Cranberry 500 MG CAPS Error  . colesevelam (WELCHOL) 625 MG tablet Error  . ezetimibe (ZETIA) 10 MG tablet Error  . vitamin E 1000 UNIT capsule Error  . Polyethylene Glycol 1000 POWD Error    Patient Active Problem List  Diagnoses  . HYPERLIPIDEMIA  . HYPERTENSION  . ATHEROSCLEROTIC CARDIOVASCULAR DISEASE  . CEREBROVASCULAR DISEASE  . Tobacco abuse, in remission  . Diabetes mellitus type 2, controlled    LABS No visits with results within 3 Month(s) from this visit. Latest known visit with results is:  Hospital Outpatient Visit on 06/18/2010  Component Date Value  . Glucose-Capillary 06/18/2010 110*     Results for this Opt Visit:     Results for orders placed during the hospital encounter of 06/18/10  GLUCOSE, CAPILLARY      Component Value Range   Glucose-Capillary 110 (*) 70 -  99 (mg/dL)    EKG No orders found for this or any previous visit.   Prior Assessment and Plan Problem List as of 10/05/2011          Cardiology Problems   HYPERLIPIDEMIA   Last Assessment & Plan Note   09/30/2010 Office Visit Addendum 10/03/2010 11:14 PM by Yehuda Savannah, MD    Lipid profile was excellent one year ago with total cholesterol 155, triglycerides of 369, HDL 32 and LDL of 49.   More recently, suboptimal values were obtained by Dr. Nevada Crane with total cholesterol of 225, triglycerides of 422, HDL of 29 and LDL unable to be calculated.  Patient reports that WelChol and ezetimibe both caused myalgias prompting him to stop taking them.  It does not appear that we will be able to maintain pharmacologic therapy.    HYPERTENSION   Last Assessment & Plan Note   09/30/2010 Office Visit Addendum 10/03/2010 11:15 PM by Yehuda Savannah, MD    Hypertension has been well-controlled with current medical therapy.  A chemistry profile was checked 2 months ago with excellent results.  Patient has  significant microalbuminemia and is receiving appropriate treatment with an ACE inhibitor.     ATHEROSCLEROTIC CARDIOVASCULAR DISEASE   Last Assessment & Plan Note   09/30/2010 Office Visit Addendum 10/03/2010 11:13 PM by Yehuda Savannah, MD    No clinical evidence for progression of coronary disease over the past 6 years.  We will continue to optimally manage vascular risk factors.    CEREBROVASCULAR DISEASE   Last Assessment & Plan Note   09/30/2010 Office Visit Signed 09/30/2010  3:24 PM by Yehuda Savannah, MD    Physical findings in neck may well reflect transmitted murmur rather than cerebrovascular disease.      Other   Tobacco abuse, in remission   Diabetes mellitus type 2, controlled       Imaging: US Venous Img Lower Unilateral Right  09/09/2011  *RADIOLOGY REPORT*  Clinical Data: Right leg and calf pain  RIGHT LOWER EXTREMITY VENOUS DUPLEX ULTRASOUND  Technique:  Gray-scale sonography with graded compression, as well as color Doppler and duplex ultrasound, were performed to evaluate the deep venous system of the lower extremity from the level of the common femoral vein through the popliteal and proximal calf veins. Spectral Doppler was utilized to evaluate flow at rest and with distal augmentation maneuvers.  Comparison:  None  Findings: Deep venous system patent and compressible from right groin through popliteal fossa. Spontaneous venous flow present with intact augmentation and evidence of respiratory phasicity. No intraluminal thrombus identified. Visualized portion of the greater saphenous system unremarkable.  IMPRESSION: No evidence of deep venous thrombosis in the right lower extremity.  Original Report Authenticated By: Burnetta Sabin, M.D.   US Arterial Seg Single  09/09/2011  *RADIOLOGY REPORT*  Clinical Data: The patient  ULTRASOUND ANKLE/BRACHIAL INDICES BILATERAL  Comparison: None.  Findings: Right and left ankle brachial indices are 0.5 and 0.84 respectively.  Right  posterior tibial wave form is biphasic but the dorsalis pedis is monophasic.  The posterior tibial and dorsalis pedis wave forms at the left ankle are both monophasic in spite of increased pressure measurements.  IMPRESSION: There is significant bilateral lower extremity arterial occlusive disease.  It is probably more severe on the left despite an increased ankle brachial index and this may simply be due to calcified vessels.  Original Report Authenticated By: Jamas Lav, M.D.     [facility] Calculation: Score  not calculated

## 2011-10-05 NOTE — Assessment & Plan Note (Signed)
Blood pressure control is suboptimal.  Amlodipine/benazepril will be increased to 10/40 mg per day in divided doses.

## 2011-10-05 NOTE — Progress Notes (Signed)
Patient ID: Anthony Murray, male   DOB: 1928-04-04, 78 y.o.   MRN: [id number]  HPI: Scheduled return visit for this pleasant gentleman with coronary artery disease and multiple cardiovascular risk factors.  Since I last saw him, he has done quite well.  He has not required hospitalization or developed any new medical issues.  He was recently evaluated for right leg pain.  Venous ultrasound excluded thrombosis while arterial Doppler studies demonstrated obstructive disease.  Mr. Damme denies claudication; the discomfort in his right calf it is not related to activity and not relieved with rest.  Prior to Admission medications   Medication Sig Start Date End Date Taking? Authorizing Provider  aspirin 81 MG tablet Take 81 mg by mouth daily.   Yes Historical Provider, MD  Brimonidine Tartrate-Timolol (COMBIGAN OP) Apply 1 drop to eye 2 (two) times daily. 1 drop both eyes bid     Yes Historical Provider, MD  dorzolamide (TRUSOPT) 2 % ophthalmic solution Place 1 drop into the left eye 2 (two) times daily.     Yes Historical Provider, MD  latanoprost (XALATAN) 0.005 % ophthalmic solution Place 1 drop into both eyes at bedtime.     Yes Historical Provider, MD  losartan-hydrochlorothiazide (HYZAAR) 100-25 MG per tablet Take 1 tablet by mouth daily.   Yes Historical Provider, MD  metFORMIN (GLUCOPHAGE) 850 MG tablet Take 850 mg by mouth 2 (two) times daily with a meal.   Yes Historical Provider, MD  metoprolol (TOPROL-XL) 50 MG 24 hr tablet Take 50 mg by mouth daily.     Yes Historical Provider, MD  omeprazole (PRILOSEC) 20 MG capsule Take 20 mg by mouth daily.     Yes Historical Provider, MD  amLODipine-benazepril (LOTREL) 5-20 MG per capsule Take 1 capsule by mouth 2 (two) times daily. 10/05/11 10/04/12  Yehuda Savannah, MD    Allergies  Allergen Reactions  . Codeine   . Ezetimibe-Simvastatin     REACTION: myalgias  . Naproxen     REACTION: and all nonsteroidals  . Niacin   . Pravastatin Sodium     REACTION: myalgias no      Past medical history, social history, and family history reviewed and updated.  ROS: Denies chest discomfort, palpitations, lightheadedness or syncope.  He has noted mild edema that is not troublesome to him.  He has chronic class II dyspnea on exertion, but activity is limited, and he is rarely symptomatic.  All other systems reviewed and are negative.  PHYSICAL EXAM: BP 149/88  Pulse 91  Resp 18  Ht 5\' 6"  (1.676 m)  Wt 81.647 kg (180 lb)  BMI 29.05 kg/m2  General-Well developed; no acute distress Body habitus-Mildly overweight Neck-No JVD; Bilateral carotid bruits vs transmitted murmur Lungs-clear lung fields; resonant to percussion Cardiovascular-normal PMI; normal S1 and S2; grade 2/6 basilar systolic ejection murmur Abdomen-normal bowel sounds; soft and non-tender without masses or organomegaly Musculoskeletal-No deformities, no cyanosis or clubbing Neurologic-Normal cranial nerves; symmetric strength and tone Skin-Warm, no significant lesions Extremities-1-2+distal pulses on the left, minimal pulses on the right; trace edema   ASSESSMENT AND PLAN:  [name], MD 10/05/2011 2:49 PM

## 2011-10-10 DIAGNOSIS — I739 Peripheral vascular disease, unspecified: Secondary | ICD-10-CM | POA: Insufficient documentation

## 2011-10-10 NOTE — Progress Notes (Addendum)
Patient ID: Anthony Murray, male   DOB: 1928-01-25, 78 y.o.   MRN: [id number]  HPI: scheduled return visit for this nice gentleman with hypertension, vascular disease and cardiovascular risk factors.  Since his last visit, has done generally well.  He describes some leg discomfort, but this is not classic claudication.  He remains active, including performing some yard work.  Prior to Admission medications   Medication Sig Start Date End Date Taking? Authorizing Provider  aspirin 81 MG tablet Take 81 mg by mouth daily.   Yes Historical Provider, MD  Brimonidine Tartrate-Timolol (COMBIGAN OP) Apply 1 drop to eye 2 (two) times daily. 1 drop both eyes bid     Yes Historical Provider, MD  dorzolamide (TRUSOPT) 2 % ophthalmic solution Place 1 drop into the left eye 2 (two) times daily.     Yes Historical Provider, MD  latanoprost (XALATAN) 0.005 % ophthalmic solution Place 1 drop into both eyes at bedtime.     Yes Historical Provider, MD  losartan-hydrochlorothiazide (HYZAAR) 100-25 MG per tablet Take 1 tablet by mouth daily.   Yes Historical Provider, MD  metFORMIN (GLUCOPHAGE) 850 MG tablet Take 850 mg by mouth 2 (two) times daily with a meal.   Yes Historical Provider, MD  metoprolol (TOPROL-XL) 50 MG 24 hr tablet Take 50 mg by mouth daily.     Yes Historical Provider, MD  omeprazole (PRILOSEC) 20 MG capsule Take 20 mg by mouth daily.     Yes Historical Provider, MD  amLODipine-benazepril (LOTREL) 5-20 MG per capsule Take 1 capsule by mouth 2 (two) times daily. 10/05/11 10/04/12  Yehuda Savannah, MD   Allergies  Allergen Reactions  . Codeine   . Ezetimibe-Simvastatin     REACTION: myalgias  . Naproxen     REACTION: and all nonsteroidals  . Niacin   . Pravastatin Sodium     REACTION: myalgias no     Past medical history, social history, and family history reviewed and updated.  ROS: Denies chest pain, dyspnea, orthopnea, PND, pedal edema, palpitations, lightheadedness or syncope.  All other  systems reviewed and are negative.   PHYSICAL EXAM: BP 149/88  Pulse 91  Resp 18  Ht 5\' 6"  (1.676 m)  Wt 81.647 kg (180 lb)  BMI 29.05 kg/m2   General-Well developed; no acute distress Body habitus-mildly overweight Neck-No JVD; right carotid bruit Lungs-clear lung fields; resonant to percussion Cardiovascular-normal PMI; normal S1 and S2; grade 2/6 basilar systolic ejection murmur Abdomen-normal bowel sounds; soft and non-tender without masses or organomegaly Musculoskeletal-No deformities, no cyanosis or clubbing Neurologic-Normal cranial nerves; symmetric strength and tone Skin-Warm, no significant lesions Extremities-distal pulses intact; no edema  ASSESSMENT AND PLAN:  [name], MD 10/10/2011 10:05 AM

## 2011-10-10 NOTE — Assessment & Plan Note (Signed)
Although disease is moderately severe on the right, patient describes no symptoms at present.  No additional diagnostic testing or intervention is required other than optimal control of vascular risk factors.

## 2011-10-10 NOTE — Assessment & Plan Note (Signed)
Diabetes is mild and well-controlled with current therapy.  ACE Inhibitor is being utilized appropriately to minimize progression of chronic kidney disease.

## 2011-10-10 NOTE — Assessment & Plan Note (Addendum)
Lipid profile is decidedly suboptimal, but the patient has experienced adverse reactions to multiple lipid-lowering agents and is not inclined to proceed with additional pharmacologic therapy.  Alternatives, such as plants sterols, will be recommended.

## 2012-10-05 ENCOUNTER — Encounter: Payer: Self-pay | Admitting: Cardiology

## 2012-10-05 ENCOUNTER — Ambulatory Visit (INDEPENDENT_AMBULATORY_CARE_PROVIDER_SITE_OTHER): Payer: Medicare Other | Admitting: Cardiology

## 2012-10-05 VITALS — BP 140/80 | HR 80 | Wt 186.0 lb

## 2012-10-05 DIAGNOSIS — F17201 Nicotine dependence, unspecified, in remission: Secondary | ICD-10-CM

## 2012-10-05 DIAGNOSIS — E119 Type 2 diabetes mellitus without complications: Secondary | ICD-10-CM

## 2012-10-05 DIAGNOSIS — I10 Essential (primary) hypertension: Secondary | ICD-10-CM

## 2012-10-05 DIAGNOSIS — Z87891 Personal history of nicotine dependence: Secondary | ICD-10-CM

## 2012-10-05 DIAGNOSIS — E785 Hyperlipidemia, unspecified: Secondary | ICD-10-CM

## 2012-10-05 DIAGNOSIS — I2510 Atherosclerotic heart disease of native coronary artery without angina pectoris: Secondary | ICD-10-CM

## 2012-10-05 DIAGNOSIS — I679 Cerebrovascular disease, unspecified: Secondary | ICD-10-CM

## 2012-10-05 DIAGNOSIS — R0609 Other forms of dyspnea: Secondary | ICD-10-CM

## 2012-10-05 DIAGNOSIS — R0989 Other specified symptoms and signs involving the circulatory and respiratory systems: Secondary | ICD-10-CM

## 2012-10-05 DIAGNOSIS — N189 Chronic kidney disease, unspecified: Secondary | ICD-10-CM

## 2012-10-05 NOTE — Progress Notes (Deleted)
Name: Anthony Murray    DOB: 13-Jun-1928  Age: 79 y.o.  MR#: [id number]       PCP:  Wende Neighbors, MD      Insurance: Payor: [place] [employer]IC[employer]  Plan: BLUE MEDICARE  Product Type: *No Product type*    CC:   BOTTLES   VS Filed Vitals:   10/05/12 1408  BP: 140/80  Pulse: 80  Weight: 186 lb (84.369 kg)    Weights Current Weight  10/05/12 186 lb (84.369 kg)  10/05/11 180 lb (81.647 kg)  09/30/10 179 lb (81.194 kg)    Blood Pressure  BP Readings from Last 3 Encounters:  10/05/12 140/80  10/05/11 149/88  09/30/10 138/67     Admit date:  (Not on file) Last encounter with RMR:  Visit date not found   Allergy Codeine; Ezetimibe-simvastatin; Naproxen; Niacin; and Pravastatin sodium  Current Outpatient Prescriptions  Medication Sig Dispense Refill  . aspirin 81 MG tablet Take 81 mg by mouth daily.      . Brimonidine Tartrate-Timolol (COMBIGAN OP) Apply 1 drop to eye 2 (two) times daily. 1 drop both eyes bid        . carvedilol (COREG) 12.5 MG tablet       . dorzolamide (TRUSOPT) 2 % ophthalmic solution Place 1 drop into the left eye 2 (two) times daily.        . fish oil-omega-3 fatty acids 1000 MG capsule Take 2 g by mouth daily.      Marland Kitchen latanoprost (XALATAN) 0.005 % ophthalmic solution Place 1 drop into both eyes at bedtime.        Marland Kitchen losartan-hydrochlorothiazide (HYZAAR) 100-25 MG per tablet Take 1 tablet by mouth daily.      Marland Kitchen omeprazole (PRILOSEC) 20 MG capsule Take 20 mg by mouth daily.        . ONGLYZA 2.5 MG TABS tablet        No current facility-administered medications for this visit.    Discontinued Meds:    Medications Discontinued During This Encounter  Medication Reason  . metoprolol (TOPROL-XL) 50 MG 24 hr tablet Error  . metFORMIN (GLUCOPHAGE) 850 MG tablet Error    Patient Active Problem List  Diagnosis  . HYPERLIPIDEMIA  . HYPERTENSION  . ATHEROSCLEROTIC CARDIOVASCULAR DISEASE  . CEREBROVASCULAR DISEASE  . Tobacco abuse, in remission  .  Diabetes mellitus type 2, controlled  . Chronic kidney disease  . Peripheral vascular disease    LABS    Component Value Date/Time   NA 141 06/02/2009   K 4.3 06/02/2009   CL 100 06/02/2009   CO2 30 06/02/2009   BUN 13 06/02/2009   CREATININE 1.04 06/02/2009   CMP     Component Value Date/Time   NA 141 06/02/2009   K 4.3 06/02/2009   CL 100 06/02/2009   CO2 30 06/02/2009   BUN 13 06/02/2009   CREATININE 1.04 06/02/2009   PROT 7 06/02/2009   ALBUMIN 4.5 06/02/2009   AST 19 06/02/2009   ALT 49 06/02/2009   ALKPHOS 58 06/02/2009    No results found for this basename: wbc, hgb, hct, mcv, platelets    Lipid Panel     Component Value Date/Time   CHOL 155 09/29/2009 1946   TRIG 369* 09/29/2009 1946   HDL 32* 09/29/2009 1946   CHOLHDL 4.8 Ratio 09/29/2009 1946   VLDL 74* 09/29/2009 1946   LDLCALC 49 09/29/2009 1946    ABG    Component Value Date/Time  PHART 7.402 09/24/2008 0940   PCO2ART 40.4 09/24/2008 0940   PO2ART 80.6 09/24/2008 0940   HCO3 24.6* 09/24/2008 0940   TCO2 21.3 09/24/2008 0940   O2SAT 95.8 09/24/2008 0940     No results found for this basename: TSH   BNP (last 3 results) No results found for this basename: PROBNP,  in the last 8760 hours Cardiac Panel (last 3 results) No results found for this basename: CKTOTAL, CKMB, TROPONINI, RELINDX,  in the last 72 hours  Iron/TIBC/Ferritin No results found for this basename: iron, tibc, ferritin     EKG No orders found for this or any previous visit.   Prior Assessment and Plan Problem List as of 10/05/2012     ICD-9-CM   HYPERLIPIDEMIA   Last Assessment & Plan   10/05/2011 Office Visit Edited 10/10/2011 10:16 AM by Yehuda Savannah, MD     Lipid profile is decidedly suboptimal, but the patient has experienced adverse reactions to multiple lipid-lowering agents and is not inclined to proceed with additional pharmacologic therapy.  Alternatives, such as plants sterols, will be recommended.    HYPERTENSION    Last Assessment & Plan   10/05/2011 Office Visit Written 10/05/2011  2:54 PM by Yehuda Savannah, MD     Blood pressure control is suboptimal.  Amlodipine/benazepril will be increased to 10/40 mg per day in divided doses.    ATHEROSCLEROTIC CARDIOVASCULAR DISEASE   Last Assessment & Plan   10/05/2011 Office Visit Written 10/05/2011  2:53 PM by Yehuda Savannah, MD     No symptoms to suggest myocardial ischemia.  We will continue to endeavor to manage cardiovascular risk factors to the extent possible.    CEREBROVASCULAR DISEASE   Last Assessment & Plan   10/05/2011 Office Visit Edited 10/10/2011 10:09 AM by Yehuda Savannah, MD     No neurologic symptoms to suggest brain ischemia or infarction.  Risk factor control and treatment with aspirin will be continued.  Aspirin dose of 81 mg should be adequate.    Tobacco abuse, in remission   Diabetes mellitus type 2, controlled   Last Assessment & Plan   10/05/2011 Office Visit Written 10/10/2011 10:17 AM by Yehuda Savannah, MD     Diabetes is mild and well-controlled with current therapy.  ACE Inhibitor is being utilized appropriately to minimize progression of chronic kidney disease.    Chronic kidney disease   Peripheral vascular disease   Last Assessment & Plan   10/05/2011 Office Visit Written 10/10/2011 10:17 AM by Yehuda Savannah, MD     Although disease is moderately severe on the right, patient describes no symptoms at present.  No additional diagnostic testing or intervention is required other than optimal control of vascular risk factors.        Imaging: No results found.

## 2012-10-05 NOTE — Progress Notes (Deleted)
Name: Anthony Murray    DOB: [birth year]  Age: 79 y.o.  MR#: [id number]       PCP:  Wende Neighbors, MD      Insurance: Payor: [employer] [place] [employer]  Plan: BLUE MEDICARE  Product Type: *No Product type*    CC:   No chief complaint on file.   VS There were no vitals filed for this visit.  Weights Current Weight  10/05/11 180 lb (81.647 kg)  09/30/10 179 lb (81.194 kg)  08/28/09 186 lb (84.369 kg)    Blood Pressure  BP Readings from Last 3 Encounters:  10/05/11 149/88  09/30/10 138/67  08/28/09 149/73     Admit date:  (Not on file) Last encounter with RMR:  Visit date not found   Allergy Codeine; Ezetimibe-simvastatin; Naproxen; Niacin; and Pravastatin sodium  Current Outpatient Prescriptions  Medication Sig Dispense Refill  . aspirin 81 MG tablet Take 81 mg by mouth daily.      . Brimonidine Tartrate-Timolol (COMBIGAN OP) Apply 1 drop to eye 2 (two) times daily. 1 drop both eyes bid        . dorzolamide (TRUSOPT) 2 % ophthalmic solution Place 1 drop into the left eye 2 (two) times daily.        Marland Kitchen latanoprost (XALATAN) 0.005 % ophthalmic solution Place 1 drop into both eyes at bedtime.        Marland Kitchen losartan-hydrochlorothiazide (HYZAAR) 100-25 MG per tablet Take 1 tablet by mouth daily.      . metFORMIN (GLUCOPHAGE) 850 MG tablet Take 850 mg by mouth 2 (two) times daily with a meal.      . metoprolol (TOPROL-XL) 50 MG 24 hr tablet Take 50 mg by mouth daily.        Marland Kitchen omeprazole (PRILOSEC) 20 MG capsule Take 20 mg by mouth daily.         No current facility-administered medications for this visit.    Discontinued Meds:   There are no discontinued medications.  Patient Active Problem List  Diagnosis  . HYPERLIPIDEMIA  . HYPERTENSION  . ATHEROSCLEROTIC CARDIOVASCULAR DISEASE  . CEREBROVASCULAR DISEASE  . Tobacco abuse, in remission  . Diabetes mellitus type 2, controlled  . Chronic kidney disease  . Peripheral vascular disease    LABS    Component Value  Date/Time   NA 141 06/02/2009   K 4.3 06/02/2009   CL 100 06/02/2009   CO2 30 06/02/2009   BUN 13 06/02/2009   CREATININE 1.04 06/02/2009   CMP     Component Value Date/Time   NA 141 06/02/2009   K 4.3 06/02/2009   CL 100 06/02/2009   CO2 30 06/02/2009   BUN 13 06/02/2009   CREATININE 1.04 06/02/2009   PROT 7 06/02/2009   ALBUMIN 4.5 06/02/2009   AST 19 06/02/2009   ALT 49 06/02/2009   ALKPHOS 58 06/02/2009    No results found for this basename: wbc, hgb, hct, mcv, platelets    Lipid Panel     Component Value Date/Time   CHOL 155 09/29/2009 1946   TRIG 369* 09/29/2009 1946   HDL 32* 09/29/2009 1946   CHOLHDL 4.8 Ratio 09/29/2009 1946   VLDL 74* 09/29/2009 1946   LDLCALC 49 09/29/2009 1946    ABG    Component Value Date/Time   PHART 7.402 09/24/2008 0940   PCO2ART 40.4 09/24/2008 0940   PO2ART 80.6 09/24/2008 0940   HCO3 24.6* 09/24/2008 0940   TCO2 21.3 09/24/2008 0940  O2SAT 95.8 09/24/2008 0940     No results found for this basename: TSH   BNP (last 3 results) No results found for this basename: PROBNP,  in the last 8760 hours Cardiac Panel (last 3 results) No results found for this basename: CKTOTAL, CKMB, TROPONINI, RELINDX,  in the last 72 hours  Iron/TIBC/Ferritin No results found for this basename: iron, tibc, ferritin     EKG No orders found for this or any previous visit.   Prior Assessment and Plan Problem List as of 10/05/2012     ICD-9-CM     Cardiology Problems   HYPERLIPIDEMIA   Last Assessment & Plan   10/05/2011 Office Visit Edited 10/10/2011 10:16 AM by Yehuda Savannah, MD     Lipid profile is decidedly suboptimal, but the patient has experienced adverse reactions to multiple lipid-lowering agents and is not inclined to proceed with additional pharmacologic therapy.  Alternatives, such as plants sterols, will be recommended.    HYPERTENSION   Last Assessment & Plan   10/05/2011 Office Visit Written 10/05/2011  2:54 PM by Yehuda Savannah, MD      Blood pressure control is suboptimal.  Amlodipine/benazepril will be increased to 10/40 mg per day in divided doses.    ATHEROSCLEROTIC CARDIOVASCULAR DISEASE   Last Assessment & Plan   10/05/2011 Office Visit Written 10/05/2011  2:53 PM by Yehuda Savannah, MD     No symptoms to suggest myocardial ischemia.  We will continue to endeavor to manage cardiovascular risk factors to the extent possible.    CEREBROVASCULAR DISEASE   Last Assessment & Plan   10/05/2011 Office Visit Edited 10/10/2011 10:09 AM by Yehuda Savannah, MD     No neurologic symptoms to suggest brain ischemia or infarction.  Risk factor control and treatment with aspirin will be continued.  Aspirin dose of 81 mg should be adequate.    Peripheral vascular disease   Last Assessment & Plan   10/05/2011 Office Visit Written 10/10/2011 10:17 AM by Yehuda Savannah, MD     Although disease is moderately severe on the right, patient describes no symptoms at present.  No additional diagnostic testing or intervention is required other than optimal control of vascular risk factors.      Other   Tobacco abuse, in remission   Diabetes mellitus type 2, controlled   Last Assessment & Plan   10/05/2011 Office Visit Written 10/10/2011 10:17 AM by Yehuda Savannah, MD     Diabetes is mild and well-controlled with current therapy.  ACE Inhibitor is being utilized appropriately to minimize progression of chronic kidney disease.    Chronic kidney disease       Imaging: No results found.

## 2012-10-05 NOTE — Patient Instructions (Addendum)
Your physician recommends that you schedule a follow-up appointment in: 1 month  Your physician has requested that you have an echocardiogram. Echocardiography is a painless test that uses sound waves to create images of your heart. It provides your doctor with information about the size and shape of your heart and how well your heart's chambers and valves are working. This procedure takes approximately one hour. There are no restrictions for this procedure.  Your physician has requested that you have a stress echocardiogram. For further information please visit [email or web address]. Please follow instruction sheet as given.  A chest x-ray takes a picture of the organs and structures inside the chest, including the heart, lungs, and blood vessels. This test can show several things, including, whether the heart is enlarges; whether fluid is building up in the lungs; and whether pacemaker / defibrillator leads are still in place.  Exercise Stress Echocardiography A stress echocardiogram, sometimes called an exercise or stress echo, is a test that combines a treadmill stress test and an echocardiogram. Sometimes a treadmill test alone gives uncertain results. The addition of an echocardiogram can improve accuracy in diagnosing heart disease. A stress echo helps your caregiver know if you need more tests on your heart or further treatment. A stress echo is done to:  Look for blocked arteries in your heart.  Detect irregular heart rhythms.  Determine how hard you are able to exercise before your heart gets into trouble or develops problems.  Show how fast your heart recovers after exercise.  If for some reason you are unable to exercise, this test can also be done with the use of medications which can speed up and stress your heart. BEFORE THE PROCEDURE  You may bathe or shower the morning of the test. Do not apply body lotions or powder to your chest.  You should be present 15 minutes prior to  your test or as instructed by your caregiver.  Wear comfortable clothes and good walking or running shoes.  Follow your caregiver's instructions regarding eating and drinking before the test.  Do not smoke for 4 hours prior to the test.  Take your medications as directed at regular times with water unless instructed otherwise. If you are on insulin, ask how you are to take it and if there are special instructions. It is common to adjust insulin dosing the morning of the examination.  Beta blocker medications interfere with the test. If you are on a beta blocker, do not take this medication for 72 hours before the test unless your caregiver instructs you otherwise. Be sure to ask if you do not understand. This must be discussed with your caregiver prior to your appointment.  If you wear a nitroglycerin patch, it may need to be removed prior to the test. Ask your caregiver if the patch should be removed. PROCEDURE   An echocardiogram will be done before exercise and again at peak heart rate. The echocardiogram uses sound waves (ultrasound) to provide an image of the heart's internal structures, size and movement. This image is produced by moving a transducer (a very sensitive wand-like device) over the chest area. A jelly like substance is put on your chest to improve the contact between the wand and your chest. Images of the beating heart are made by bouncing high-frequency (ultrasound) sound waves off the heart.  Electrodes placed on the chest will monitor the heart's rate and rhythm throughout the test. If you have a hairy chest, small areas may have to be shaved  to get better contact with the electrodes. Once the electrodes are attached to your body, multiple wires will be attached to the electrodes. These are then attached to an (EKG) machine. The EKG will monitor your heart's rate and rhythm (regularity).  Your caregiver (often a cardiologist) will have you walk on a treadmill. The treadmill  will gradually be made to increase in speed and incline. You will exercise up to 15 minutes depending upon your condition and the condition of your heart.  The test will be stopped if you become too tired. It will also be stopped if you develop chest pain, blood pressure problems, abnormal heart rhythms or signs on the EKG that suggest the heart is overly stressed and not getting enough blood.  At the peak of exercise, the treadmill will be stopped. You will lie down immediately on a bed so that a second echocardiogram can be taken to visualize the heart's motion with exercise.  The test usually takes 30-60 minutes to complete. HOME CARE INSTRUCTIONS   Resume your pre-hospital medications, activity and diet unless instructed otherwise.  Keep your follow-up appointments as instructed. SEEK IMMEDIATE MEDICAL CARE IF:  You have pain or pressure in the following areas:  Chest.  Jaw or neck.  Between your shoulder blades.  Pain radiating down your left arm.  You have nausea (feeling sick to your stomach).  You vomit.  You faint.  You feel short of breath. MAKE SURE YOU:   Understand these instructions.  Will watch your condition.  Will get help right away if you are not doing well or get worse. Document Released: 06/11/2004 Document Revised: 08/30/2011 Document Reviewed: 09/17/2008 [facility] Patient Information 2013 [place].

## 2012-10-06 NOTE — Assessment & Plan Note (Signed)
Patient reports limiting dyspnea on exertion. PFTs a few years ago did not suggest a pulmonary etiology. He has had coronary artery disease, albeit nonobstructive, and we will proceed with a stress test in conjunction with echocardiography, which will also evaluate his systolic murmur, which likely represents aortic sclerosis or mild stenosis, and we'll verify a normal left ventricular systolic function. A chest x-ray as well as basic laboratory studies are also pending. Return visit in one month for reassessment of symptoms and discussion of test results.

## 2012-10-06 NOTE — Assessment & Plan Note (Signed)
Control of hypertension has been fairly good when assessed in the office. Patient reports better results outside of the medical setting.

## 2012-10-06 NOTE — Assessment & Plan Note (Signed)
Minimal carotid atherosclerosis 3 years ago. Due to patient's advanced age and absence of neurologic symptoms, I'm not like to pursue additional screening studies.

## 2012-10-06 NOTE — Assessment & Plan Note (Signed)
Previous lipid profiles have been suboptimal, but patient has not tolerated treatment with statins.

## 2012-10-06 NOTE — Progress Notes (Signed)
Patient ID: Anthony Murray, male   DOB: [birth year], 79 y.o.   MRN: [id number]  HPI: Schedule return visit for this pleasant gentleman with a history of coronary artery disease who now complains of exertional dyspnea. His symptoms appear to be predominantly chronic, but he has not offered this complaint in the recent past. He reports class 2-3 symptoms despite a sedentary lifestyle. He has no chest discomfort, cough, sputum production or history of chronic lung disease. Cigarette consumption of 30-pack-years was discontinued in 1971. PFTs in 2010 were relatively unremarkable.  Current Outpatient Prescriptions  Medication Sig Dispense Refill  . aspirin 81 MG tablet Take 81 mg by mouth daily.      . Brimonidine Tartrate-Timolol (COMBIGAN OP) Apply 1 drop to eye 2 (two) times daily. 1 drop both eyes bid        . carvedilol (COREG) 12.5 MG tablet       . dorzolamide (TRUSOPT) 2 % ophthalmic solution Place 1 drop into the left eye 2 (two) times daily.        . fish oil-omega-3 fatty acids 1000 MG capsule Take 2 g by mouth daily.      Marland Kitchen latanoprost (XALATAN) 0.005 % ophthalmic solution Place 1 drop into both eyes at bedtime.        Marland Kitchen losartan-hydrochlorothiazide (HYZAAR) 100-25 MG per tablet Take 1 tablet by mouth daily.      Marland Kitchen omeprazole (PRILOSEC) 20 MG capsule Take 20 mg by mouth daily.        . ONGLYZA 2.5 MG TABS tablet        No current facility-administered medications for this visit.   Allergies  Allergen Reactions  . Codeine   . Ezetimibe-Simvastatin     REACTION: myalgias  . Naproxen     REACTION: and all nonsteroidals  . Niacin   . Pravastatin Sodium     REACTION: myalgias no     Past medical history, social history, and family history reviewed and updated.  ROS: See history of present illness. Otherwise, all systems reviewed and are negative.  PHYSICAL EXAM: BP 140/80  Pulse 80  Wt 84.369 kg (186 lb)  BMI 30.04 kg/m2;  Body mass index is 30.04 kg/(m^2). General-Well  developed; no acute distress Body habitus-proportionate weight and height Neck-No JVD; no carotid bruits Lungs-clear lung fields; resonant to percussion Cardiovascular-normal PMI; normal S1 and S2; grade 2-3/6 basilar early and mid systolic ejection murmur with early peak and no radiation Abdomen-normal bowel sounds; soft and non-tender without masses or organomegaly Musculoskeletal-No deformities, no cyanosis or clubbing Neurologic-Normal cranial nerves; symmetric strength and tone Skin-Warm, no significant lesions Extremities-distal pulses intact; 1/2+ ankle edema  EKG: Normal sinus rhythm; minor nonspecific T wave abnormality.  6 minute walk: Oxygen saturation 94-96% throughout; postexercise heart rate-102   [name], MD 10/06/2012  9:16 AM  ASSESSMENT AND PLAN

## 2012-10-06 NOTE — Assessment & Plan Note (Addendum)
Slight progression of chronic kidney disease over the past year-remains mild to moderate.  Albuminuria is present and appropriately treated with an ARB.

## 2012-10-16 ENCOUNTER — Encounter: Payer: Self-pay | Admitting: Cardiology

## 2012-10-17 ENCOUNTER — Ambulatory Visit (HOSPITAL_COMMUNITY)
Admission: RE | Admit: 2012-10-17 | Discharge: 2012-10-17 | Disposition: A | Discharge status: Home / Self Care | Payer: Medicare Other | Source: Ambulatory Visit | Attending: Cardiology | Admitting: Cardiology

## 2012-10-17 ENCOUNTER — Encounter (HOSPITAL_COMMUNITY): Payer: Self-pay | Admitting: Cardiology

## 2012-10-17 DIAGNOSIS — R0602 Shortness of breath: Secondary | ICD-10-CM

## 2012-10-17 DIAGNOSIS — I359 Nonrheumatic aortic valve disorder, unspecified: Secondary | ICD-10-CM

## 2012-10-17 DIAGNOSIS — R0989 Other specified symptoms and signs involving the circulatory and respiratory systems: Secondary | ICD-10-CM | POA: Insufficient documentation

## 2012-10-17 DIAGNOSIS — I10 Essential (primary) hypertension: Secondary | ICD-10-CM | POA: Insufficient documentation

## 2012-10-17 DIAGNOSIS — R0609 Other forms of dyspnea: Secondary | ICD-10-CM | POA: Insufficient documentation

## 2012-10-17 DIAGNOSIS — E119 Type 2 diabetes mellitus without complications: Secondary | ICD-10-CM | POA: Insufficient documentation

## 2012-10-17 NOTE — Progress Notes (Signed)
*  PRELIMINARY RESULTS* Echocardiogram Echocardiogram Stress Test has been performed.  Tera Partridge 10/17/2012, 10:50 AM

## 2012-10-17 NOTE — Progress Notes (Signed)
Stress Lab Nurses Notes - [place] 10/17/2012 Reason for doing test: DOE Type of test: Stress Echo Nurse performing test: Gerrit Halls, RN Nuclear Medicine Tech: Not Applicable Echo Tech: Not Applicable MD performing test: R. Lattie Haw Family MD: Dr. Nevada Crane Test explained and consent signed: yes IV started: No IV started Symptoms: Fatigue in legs SOB Treatment/Intervention: None Reason test stopped: fatigue After recovery IV was: NA Patient to return to [place]. Med at :NA Patient discharged: Home Patient's Condition upon discharge was: stable Comments: During test peak BP 171/89 & HR 115.  Recovery BP 158/89 & HR 77.  Symptoms resolved in recovery. Geanie Cooley T

## 2012-10-17 NOTE — Progress Notes (Signed)
*  PRELIMINARY RESULTS* Echocardiogram 2D Echocardiogram has been performed.  Anthony Murray 10/17/2012, 10:50 AM

## 2012-10-19 ENCOUNTER — Encounter: Payer: Self-pay | Admitting: Cardiology

## 2012-10-19 ENCOUNTER — Ambulatory Visit (HOSPITAL_COMMUNITY)
Admission: RE | Admit: 2012-10-19 | Discharge: 2012-10-19 | Disposition: A | Discharge status: Home / Self Care | Payer: Medicare Other | Source: Ambulatory Visit | Attending: Cardiology | Admitting: Cardiology

## 2012-10-19 DIAGNOSIS — I350 Nonrheumatic aortic (valve) stenosis: Secondary | ICD-10-CM | POA: Insufficient documentation

## 2012-10-19 DIAGNOSIS — R0989 Other specified symptoms and signs involving the circulatory and respiratory systems: Secondary | ICD-10-CM | POA: Insufficient documentation

## 2012-10-19 DIAGNOSIS — R0609 Other forms of dyspnea: Secondary | ICD-10-CM | POA: Insufficient documentation

## 2012-11-09 ENCOUNTER — Ambulatory Visit (INDEPENDENT_AMBULATORY_CARE_PROVIDER_SITE_OTHER): Payer: Medicare Other | Admitting: Cardiology

## 2012-11-09 ENCOUNTER — Encounter: Payer: Self-pay | Admitting: Cardiology

## 2012-11-09 VITALS — BP 140/82 | HR 85 | Ht 66.5 in | Wt 182.0 lb

## 2012-11-09 DIAGNOSIS — I359 Nonrheumatic aortic valve disorder, unspecified: Secondary | ICD-10-CM

## 2012-11-09 DIAGNOSIS — I679 Cerebrovascular disease, unspecified: Secondary | ICD-10-CM

## 2012-11-09 DIAGNOSIS — E785 Hyperlipidemia, unspecified: Secondary | ICD-10-CM

## 2012-11-09 DIAGNOSIS — I350 Nonrheumatic aortic (valve) stenosis: Secondary | ICD-10-CM

## 2012-11-09 DIAGNOSIS — I7090 Unspecified atherosclerosis: Secondary | ICD-10-CM

## 2012-11-09 DIAGNOSIS — I10 Essential (primary) hypertension: Secondary | ICD-10-CM

## 2012-11-09 DIAGNOSIS — I739 Peripheral vascular disease, unspecified: Secondary | ICD-10-CM

## 2012-11-09 DIAGNOSIS — I2510 Atherosclerotic heart disease of native coronary artery without angina pectoris: Secondary | ICD-10-CM

## 2012-11-09 DIAGNOSIS — N189 Chronic kidney disease, unspecified: Secondary | ICD-10-CM

## 2012-11-09 NOTE — Assessment & Plan Note (Signed)
Stress echocardiogram was negative for ischemia, but exercise tolerance was markedly impaired. Patient advised to gradually increase daily activity in an attempt to achieve improved physical conditioning.

## 2012-11-09 NOTE — Assessment & Plan Note (Signed)
No recent laboratory tests available; patient's PCP, Dr. Nevada Crane, is managing this and all medical problems.

## 2012-11-09 NOTE — Assessment & Plan Note (Signed)
Patient has significantly reduced right ABI in the absence of clinical claudication. No intervention warranted at present.

## 2012-11-09 NOTE — Progress Notes (Deleted)
Name: Anthony Murray    DOB: 1927-09-30  Age: 79 y.o.  MR#: [id number]       PCP:  Delphina Cahill, MD      Insurance: Payor: Amber / Plan: BLUE MEDICARE / Product Type: *No Product type* /   CC:   No chief complaint on file. BOTTLES  VS Filed Vitals:   11/09/12 1340  BP: 140/82  Pulse: 85  Height: 5' 6.5" (1.689 m)  Weight: 182 lb (82.555 kg)  SpO2: 97%    Weights Current Weight  11/09/12 182 lb (82.555 kg)  10/05/12 186 lb (84.369 kg)  10/05/11 180 lb (81.647 kg)    Blood Pressure  BP Readings from Last 3 Encounters:  11/09/12 140/82  10/05/12 140/80  10/05/11 149/88     Admit date:  (Not on file) Last encounter with RMR:  10/05/2012   Allergy Codeine; Ezetimibe-simvastatin; Naproxen; Niacin; and Pravastatin sodium  Current Outpatient Prescriptions  Medication Sig Dispense Refill  . aspirin 81 MG tablet Take 81 mg by mouth daily.      . Brimonidine Tartrate-Timolol (COMBIGAN OP) Apply 1 drop to eye 2 (two) times daily. 1 drop both eyes bid        . carvedilol (COREG) 12.5 MG tablet       . dorzolamide (TRUSOPT) 2 % ophthalmic solution Place 1 drop into the left eye 2 (two) times daily.        . fish oil-omega-3 fatty acids 1000 MG capsule Take 2 g by mouth daily.      Marland Kitchen latanoprost (XALATAN) 0.005 % ophthalmic solution Place 1 drop into both eyes at bedtime.        Marland Kitchen linagliptin (TRADJENTA) 5 MG TABS tablet Take 5 mg by mouth daily.      Marland Kitchen losartan-hydrochlorothiazide (HYZAAR) 100-25 MG per tablet Take 1 tablet by mouth daily.      Marland Kitchen omeprazole (PRILOSEC) 20 MG capsule Take 20 mg by mouth daily.        . vitamin C (ASCORBIC ACID) 500 MG tablet Take 500 mg by mouth daily.      . vitamin E 400 UNIT capsule Take 400 Units by mouth daily.       No current facility-administered medications for this visit.    Discontinued Meds:    Medications Discontinued During This Encounter  Medication Reason  . ONGLYZA 2.5 MG TABS tablet Error     Patient Active Problem List   Diagnosis Date Noted  . Aortic stenosis 10/19/2012  . Chronic kidney disease 10/10/2011  . Peripheral vascular disease 10/10/2011  . Tobacco abuse, in remission   . Diabetes mellitus type 2, controlled   . ATHEROSCLEROTIC CARDIOVASCULAR DISEASE 08/28/2009  . CEREBROVASCULAR DISEASE 08/28/2009  . HYPERLIPIDEMIA 12/09/2008  . HYPERTENSION 12/09/2008    LABS    Component Value Date/Time   NA 141 06/02/2009   K 4.3 06/02/2009   CL 100 06/02/2009   CO2 30 06/02/2009   BUN 13 06/02/2009   CREATININE 1.04 06/02/2009   CMP     Component Value Date/Time   NA 141 06/02/2009   K 4.3 06/02/2009   CL 100 06/02/2009   CO2 30 06/02/2009   BUN 13 06/02/2009   CREATININE 1.04 06/02/2009   PROT 7 06/02/2009   ALBUMIN 4.5 06/02/2009   AST 19 06/02/2009   ALT 49 06/02/2009   ALKPHOS 58 06/02/2009    No results found for this basename: wbc, hgb, hct, mcv, platelets  Lipid Panel     Component Value Date/Time   CHOL 155 09/29/2009 1946   TRIG 369* 09/29/2009 1946   HDL 32* 09/29/2009 1946   CHOLHDL 4.8 Ratio 09/29/2009 1946   VLDL 74* 09/29/2009 1946   LDLCALC 49 09/29/2009 1946    ABG    Component Value Date/Time   PHART 7.402 09/24/2008 0940   PCO2ART 40.4 09/24/2008 0940   PO2ART 80.6 09/24/2008 0940   HCO3 24.6* 09/24/2008 0940   TCO2 21.3 09/24/2008 0940   O2SAT 95.8 09/24/2008 0940     No results found for this basename: TSH   BNP (last 3 results) No results found for this basename: PROBNP,  in the last 8760 hours Cardiac Panel (last 3 results) No results found for this basename: CKTOTAL, CKMB, TROPONINI, RELINDX,  in the last 72 hours  Iron/TIBC/Ferritin No results found for this basename: iron, tibc, ferritin     EKG Orders placed in visit on 10/05/12  . EKG 12-LEAD     Prior Assessment and Plan Problem List as of 11/09/2012   HYPERLIPIDEMIA   Last Assessment & Plan   10/05/2012 Office Visit Written 10/06/2012  9:25 AM by Yehuda Savannah, MD     Previous lipid profiles have been suboptimal, but patient has not tolerated treatment with statins.    HYPERTENSION   Last Assessment & Plan   10/05/2012 Office Visit Written 10/06/2012  9:26 AM by Yehuda Savannah, MD     Control of hypertension has been fairly good when assessed in the office. Patient reports better results outside of the medical setting.    ATHEROSCLEROTIC CARDIOVASCULAR DISEASE   Last Assessment & Plan   10/05/2012 Office Visit Written 10/06/2012  9:24 AM by Yehuda Savannah, MD     Patient reports limiting dyspnea on exertion. PFTs a few years ago did not suggest a pulmonary etiology. He has had coronary artery disease, albeit nonobstructive, and we will proceed with a stress test in conjunction with echocardiography, which will also evaluate his systolic murmur, which likely represents aortic sclerosis or mild stenosis, and we'll verify a normal left ventricular systolic function. A chest x-ray as well as basic laboratory studies are also pending. Return visit in one month for reassessment of symptoms and discussion of test results.    CEREBROVASCULAR DISEASE   Last Assessment & Plan   10/05/2012 Office Visit Written 10/06/2012  9:24 AM by Yehuda Savannah, MD     Minimal carotid atherosclerosis 3 years ago. Due to patient's advanced age and absence of neurologic symptoms, I'm not like to pursue additional screening studies.    Tobacco abuse, in remission   Diabetes mellitus type 2, controlled   Last Assessment & Plan   10/05/2011 Office Visit Written 10/10/2011 10:17 AM by Yehuda Savannah, MD     Diabetes is mild and well-controlled with current therapy.  ACE Inhibitor is being utilized appropriately to minimize progression of chronic kidney disease.    Chronic kidney disease   Last Assessment & Plan   10/05/2012 Office Visit Edited 10/06/2012  9:31 AM by Yehuda Savannah, MD     Slight progression of chronic kidney disease over the past year-remains  mild to moderate.  Albuminuria is present and appropriately treated with an ARB.    Peripheral vascular disease   Last Assessment & Plan   10/05/2011 Office Visit Written 10/10/2011 10:17 AM by Yehuda Savannah, MD     Although disease is moderately severe on the right,  patient describes no symptoms at present.  No additional diagnostic testing or intervention is required other than optimal control of vascular risk factors.    Aortic stenosis       Imaging: Dg Chest 2 View  10/19/2012   *RADIOLOGY REPORT*  Clinical Data: Dyspnea on exertion  CHEST - 2 VIEW  Comparison: 06/25/2011  Findings: Heart and mediastinal contours are stable with a normal heart size and mild aortic ectasia with aortic arch calcification.  The lung fields appear clear with no signs of focal infiltrate or congestive failure.  No pleural fluid or significant peribronchial cuffing is noted. No change in overall appearance is identified  Bony structures demonstrate mild degenerative change of the lower lumbar spine.  IMPRESSION: Stable cardiopulmonary appearance with no new focal or acute abnormality seen   Original Report Authenticated By: Ponciano Ort, M.D.

## 2012-11-09 NOTE — Assessment & Plan Note (Signed)
Patient has had an unfortunate degree of hyperlipidemia with inability to tolerate statin therapy in the past. In the absence of severe or symptomatic vascular disease, I have not been inclined to initiate complex pharmacologic lipid-lowering therapy.

## 2012-11-09 NOTE — Patient Instructions (Addendum)
Your physician recommends that you schedule a follow-up appointment in: [place] physician recommends that you return for lab work in: TODAY (SLIPS GIVEN-CBC, TSH)  Your physician recommends that you increase your activity are tolerated.

## 2012-11-09 NOTE — Assessment & Plan Note (Addendum)
Aortic stenosis is mild to at worst moderate and probably not contributing much, if anything, to patient's current symptoms. He will require annual reassessment and repeat echocardiography at intervals.

## 2012-11-09 NOTE — Assessment & Plan Note (Signed)
Blood pressure control has been good for at least the past year. Current therapy will be continued.

## 2012-11-09 NOTE — Progress Notes (Signed)
Patient ID: Anthony Murray, male   DOB: Jan 13, 1928, 79 y.o.   MRN: [id number]  HPI: Schedule return visit for this very nice older gentleman complaining of increasing dyspnea.  Since his last visit, he has done fairly well, but continues a sedentary lifestyle with a desire to be more active.   This is precluded by his dyspnea.  Current Outpatient Prescriptions  Medication Sig Dispense Refill  . aspirin 81 MG tablet Take 81 mg by mouth daily.      . Brimonidine Tartrate-Timolol (COMBIGAN OP) Apply 1 drop to eye 2 (two) times daily. 1 drop both eyes bid        . carvedilol (COREG) 12.5 MG tablet       . dorzolamide (TRUSOPT) 2 % ophthalmic solution Place 1 drop into the left eye 2 (two) times daily.        . fish oil-omega-3 fatty acids 1000 MG capsule Take 2 g by mouth daily.      Marland Kitchen latanoprost (XALATAN) 0.005 % ophthalmic solution Place 1 drop into both eyes at bedtime.        Marland Kitchen linagliptin (TRADJENTA) 5 MG TABS tablet Take 5 mg by mouth daily.      Marland Kitchen losartan-hydrochlorothiazide (HYZAAR) 100-25 MG per tablet Take 1 tablet by mouth daily.      Marland Kitchen omeprazole (PRILOSEC) 20 MG capsule Take 20 mg by mouth daily.        . vitamin C (ASCORBIC ACID) 500 MG tablet Take 500 mg by mouth daily.      . vitamin E 400 UNIT capsule Take 400 Units by mouth daily.       No current facility-administered medications for this visit.   Allergies  Allergen Reactions  . Codeine   . Ezetimibe-Simvastatin     REACTION: myalgias  . Naproxen     REACTION: and all nonsteroidals  . Niacin   . Pravastatin Sodium     REACTION: myalgias no     Past medical history, social history, and family history reviewed and updated.  ROS: Denies orthopnea, PND, palpitations, lightheadedness or syncope.  PHYSICAL EXAM: BP 140/82  Pulse 85  Ht 5' 6.5" (1.689 m)  Wt 82.555 kg (182 lb)  BMI 28.94 kg/m2  SpO2 97%;  Body mass index is 28.94 kg/(m^2). General-Well developed; no acute distress Body habitus-proportionate  weight and height Neck-No JVD; left carotid bruit Lungs-clear lung fields; resonant to percussion Cardiovascular-normal PMI; normal S1 and preserved S2; grade 2/6 systolic ejection murmur at the cardiac base Abdomen-normal bowel sounds; soft and non-tender without masses or organomegaly Musculoskeletal-No deformities, no cyanosis or clubbing Neurologic-Normal cranial nerves; symmetric strength and tone Skin-Warm, no significant lesions Extremities-distal pulses intact; no edema  [name], MD 11/09/2012  7:39 PM  ASSESSMENT AND PLAN

## 2012-11-09 NOTE — Assessment & Plan Note (Signed)
Due to advanced age and minimally abnormal carotid duplex exam 3 years ago, I am not inclined to perform serial imaging studies.

## 2012-11-10 ENCOUNTER — Encounter: Payer: Self-pay | Admitting: *Deleted

## 2012-11-10 ENCOUNTER — Encounter: Payer: Self-pay | Admitting: Cardiology

## 2012-11-10 LAB — TSH: TSH: 3.571 u[IU]/mL (ref 0.350–4.500)

## 2012-11-10 LAB — CBC
MCH: 28.7 pg (ref 26.0–34.0)
Platelets: 203 10*3/uL (ref 150–400)
RBC: 5.08 MIL/uL (ref 4.22–5.81)
WBC: 4.9 10*3/uL (ref 4.0–10.5)

## 2014-09-05 ENCOUNTER — Ambulatory Visit (INDEPENDENT_AMBULATORY_CARE_PROVIDER_SITE_OTHER): Payer: Self-pay | Admitting: Cardiology

## 2014-09-05 ENCOUNTER — Encounter: Payer: Self-pay | Admitting: Cardiology

## 2014-09-05 VITALS — BP 142/70 | HR 72 | Ht 66.0 in | Wt 186.0 lb

## 2014-09-05 DIAGNOSIS — I10 Essential (primary) hypertension: Secondary | ICD-10-CM

## 2014-09-05 DIAGNOSIS — I350 Nonrheumatic aortic (valve) stenosis: Secondary | ICD-10-CM | POA: Diagnosis not present

## 2014-09-05 DIAGNOSIS — I2510 Atherosclerotic heart disease of native coronary artery without angina pectoris: Secondary | ICD-10-CM

## 2014-09-05 MED ORDER — FUROSEMIDE 20 MG PO TABS: ORAL_TABLET | ORAL | Status: DC

## 2014-09-05 NOTE — Patient Instructions (Addendum)
Your physician recommends that you schedule a follow-up appointment in: 4 months  Take lasix 20 mg daily as needed for swelling   Your physician has requested that you have an echocardiogram. Echocardiography is a painless test that uses sound waves to create images of your heart. It provides your doctor with information about the size and shape of your heart and how well your heart's chambers and valves are working. This procedure takes approximately one hour. There are no restrictions for this procedure.      Thank you for choosing [facility] !

## 2014-09-05 NOTE — Progress Notes (Signed)
Clinical Summary Anthony Murray is a 81 y.o.male last seen by Dr Lattie Haw, this is our first visit together. He is seen for the following medical problems.  1. CAD - cath 10/2004 with moderate non-obstructive disease - echo 09/2012 LVEF 55-60% - denies any chest pain. With walking aching pain bother arms, not positional. Has some SOB. Pain resolves with rest. Ongoing for 6 years. Stable frequency, stable severity. No change in level of actvity. - notes some occasional LE edema  2. HL - not on cholesterol meds, he stopped in the past.  - reports statins have caused muscle aches including pravastatin   3. Aortic stenosis - reported as mild, mixed reported findings including mean grad of 10 but AVA by VTI of 0.9 - reports some SOB at times. No syncope or presyncope  Past Medical History  Diagnosis Date  . ASCVD (arteriosclerotic cardiovascular disease)     50% ostial left left anterior desending 60% mid circumflex and normal ejection fractioon in 2006 exertional dyspnea;history of chest discomfort  . Hyperlipidemia     statin intolerant  . Hypertension   . Tobacco abuse, in remission     30 pack years stopped in 1971  . Cerebrovascular disease     minimal carotid bruits  . Diabetes mellitus type 2, controlled     no insulin     Allergies  Allergen Reactions  . Codeine   . Ezetimibe-Simvastatin     REACTION: myalgias  . Naproxen     REACTION: and all nonsteroidals  . Niacin   . Pravastatin Sodium     REACTION: myalgias no     Current Outpatient Prescriptions  Medication Sig Dispense Refill  . aspirin 81 MG tablet Take 81 mg by mouth daily.    . Brimonidine Tartrate-Timolol (COMBIGAN OP) Apply 1 drop to eye 2 (two) times daily. 1 drop both eyes bid      . carvedilol (COREG) 12.5 MG tablet     . dorzolamide (TRUSOPT) 2 % ophthalmic solution Place 1 drop into the left eye 2 (two) times daily.      . fish oil-omega-3 fatty acids 1000 MG capsule Take 2 g by mouth daily.     Marland Kitchen latanoprost (XALATAN) 0.005 % ophthalmic solution Place 1 drop into both eyes at bedtime.      Marland Kitchen linagliptin (TRADJENTA) 5 MG TABS tablet Take 5 mg by mouth daily.    Marland Kitchen losartan-hydrochlorothiazide (HYZAAR) 100-25 MG per tablet Take 1 tablet by mouth daily.    Marland Kitchen omeprazole (PRILOSEC) 20 MG capsule Take 20 mg by mouth daily.      . vitamin C (ASCORBIC ACID) 500 MG tablet Take 500 mg by mouth daily.    . vitamin E 400 UNIT capsule Take 400 Units by mouth daily.     No current facility-administered medications for this visit.     Past Surgical History  Procedure Laterality Date  . Carpal tunnel release      surgery left 05/05/2001  . Appendectomy    . Transurethral incision of prostate      resection of the prostate  . Knee arthroscopy      bilaterial; unknown associated surgical procedure  . Tonsillectomy    . Shoulder surgery      Right x2; third procedure on left     Allergies  Allergen Reactions  . Codeine   . Ezetimibe-Simvastatin     REACTION: myalgias  . Naproxen     REACTION: and all nonsteroidals  .  Niacin   . Pravastatin Sodium     REACTION: myalgias no      No family history on file.   Social History Anthony Murray reports that he quit smoking about 44 years ago. His smoking use included Cigarettes. He has a 40 pack-year smoking history. He has never used smokeless tobacco. Anthony Murray reports that he does not drink alcohol.   Review of Systems CONSTITUTIONAL: No weight loss, fever, chills, weakness or fatigue.  HEENT: Eyes: No visual loss, blurred vision, double vision or yellow sclerae.No hearing loss, sneezing, congestion, runny nose or sore throat.  SKIN: No rash or itching.  CARDIOVASCULAR: per HPI RESPIRATORY: No cough or sputum.  GASTROINTESTINAL: No anorexia, nausea, vomiting or diarrhea. No abdominal pain or blood.  GENITOURINARY: No burning on urination, no polyuria NEUROLOGICAL: No headache, dizziness, syncope, paralysis, ataxia, numbness  or tingling in the extremities. No change in bowel or bladder control.  MUSCULOSKELETAL: No muscle, back pain, joint pain or stiffness.  LYMPHATICS: No enlarged nodes. No history of splenectomy.  PSYCHIATRIC: No history of depression or anxiety.  ENDOCRINOLOGIC: No reports of sweating, cold or heat intolerance. No polyuria or polydipsia.  Marland Kitchen   Physical Examination p 72 bp 142/70 Wt 186 lbs BMI 30 Gen: resting comfortably, no acute distress HEENT: no scleral icterus, pupils equal round and reactive, no palptable cervical adenopathy,  CV: RRR, 2/6 systolic murmur RUSB, no JVD, no carotid bruits Resp: Clear to auscultation bilaterally GI: abdomen is soft, non-tender, non-distended, normal bowel sounds, no hepatosplenomegaly MSK: extremities are warm, no edema.  Skin: warm, no rash Neuro:  no focal deficits Psych: appropriate affect   Diagnostic Studies 09/2012 stress echo:  Study Conclusions  - Stress ECG conclusions: There were no significant stress arrhythmias or conduction abnormalities; rare PVCs were present during exercise and in recovery. The stress ECG was negative for ischemia. - Staged echo: There was no echocardiographic evidence for stress-induced ischemia.   09/2012 echo Study Conclusions  - Left ventricle: The cavity size was normal. Wall thickness was increased in a pattern of mild LVH. There was moderate focal basal hypertrophy of the septum. Systolic function was normal. The estimated ejection fraction was in the range of 55% to 60%. Wall motion was normal; there were no regional wall motion abnormalities. - Aortic valve: Mildly to moderately calcified annulus. Moderately thickened, moderately calcified leaflets. Valve mobility was mildly restricted. There was mild stenosis. Trivial regurgitation. Valve area: 0.9cm^2(VTI). Valve area: 0.79cm^2 (Vmax). - Aorta: The aorta was moderately calcified. - Mitral valve: Calcified annulus.  Mildly calcified leaflets . - Atrial septum: No defect or patent foramen ovale was identified.  11/2004 Cath FINDINGS: 1. Patient does not have dextrocardia. The heart is normally located in  the left side of the chest. Aortic arch appears normal based on the  course of the catheter through it. 2. LV 167/19/25. 3. No aortic stenosis. 4. Left main: Angiographically normal. 5. LAD: Large vessel giving rise to a single, very large, diagonal. There  is a 50% stenosis of the ostium of the LAD. 6. Circumflex: Large vessel giving rise to three obtuse marginals. There  is a 60% stenosis of the mid vessel. 7. RCA: Tortuous, dominant vessel. It is angiographically normal.  IMPRESSION: 1. No dextrocardia. 2. No previously placed coronary stent. 3. Moderate nonobstructive coronary disease. 4. Elevated left ventricular end diastolic pressure in the setting of  systemic hypertension.    Assessment and Plan  1. CAD - no current cardiac chest pain - continue  risk factor modification and secondary prevention  2. HL - intolerant to statins, continue dietary modification. Consider zetia at next visit  3. Aortic stenosis - mixed findings from last echo report 2 years ago. Does report some SOB and LE edema, will repeat echo to reevaluate. Start prn lasix 20mg    F/u 4 months   [name], M.D.

## 2014-09-06 ENCOUNTER — Encounter: Payer: Self-pay | Admitting: Cardiology

## 2014-09-12 ENCOUNTER — Ambulatory Visit (HOSPITAL_COMMUNITY)
Admission: RE | Admit: 2014-09-12 | Discharge: 2014-09-12 | Disposition: A | Discharge status: Home / Self Care | Payer: PPO | Source: Ambulatory Visit | Attending: Cardiology | Admitting: Cardiology

## 2014-09-12 DIAGNOSIS — I350 Nonrheumatic aortic (valve) stenosis: Secondary | ICD-10-CM | POA: Insufficient documentation

## 2014-09-12 NOTE — Progress Notes (Signed)
  Echocardiogram 2D Echocardiogram has been performed.  Anthony Murray 09/12/2014, 10:18 AM

## 2015-01-10 ENCOUNTER — Encounter: Payer: Self-pay | Admitting: Cardiology

## 2015-01-10 ENCOUNTER — Ambulatory Visit (INDEPENDENT_AMBULATORY_CARE_PROVIDER_SITE_OTHER): Payer: PPO | Admitting: Cardiology

## 2015-01-10 VITALS — BP 130/72 | HR 75 | Wt 185.0 lb

## 2015-01-10 DIAGNOSIS — I350 Nonrheumatic aortic (valve) stenosis: Secondary | ICD-10-CM

## 2015-01-10 DIAGNOSIS — R0602 Shortness of breath: Secondary | ICD-10-CM | POA: Diagnosis not present

## 2015-01-10 DIAGNOSIS — I2510 Atherosclerotic heart disease of native coronary artery without angina pectoris: Secondary | ICD-10-CM

## 2015-01-10 NOTE — Progress Notes (Signed)
Patient ID: Anthony Murray, male   DOB: 1928-02-08, 81 y.o.   MRN: [id number]     Clinical Summary Anthony Murray is a 81 y.o.male seen today for follow up of the following medical problems.   1. CAD - cath 11/2004 with moderate non-obstructive disease - echo 09/2012 LVEF 55-60% - denies any chest pain.   2. HL - reports statins have caused muscle aches including pravastatin   3. Aortic stenosis - DOE at 200 feet which is stable. Denies any chest pain or syncope - echos in the past with mixed findings, severe my reported AVA but mild by gradient.   4. SOB - notes wheezing and coughing associated with SOB at ties   Past Medical History  Diagnosis Date  . ASCVD (arteriosclerotic cardiovascular disease)     50% ostial left left anterior desending 60% mid circumflex and normal ejection fractioon in 2006 exertional dyspnea;history of chest discomfort  . Hyperlipidemia     statin intolerant  . Hypertension   . Tobacco abuse, in remission     30 pack years stopped in 1971  . Cerebrovascular disease     minimal carotid bruits  . Diabetes mellitus type 2, controlled     no insulin     Allergies  Allergen Reactions  . Codeine   . Ezetimibe-Simvastatin     REACTION: myalgias  . Naproxen     REACTION: and all nonsteroidals  . Niacin   . Pravastatin Sodium     REACTION: myalgias no     Current Outpatient Prescriptions  Medication Sig Dispense Refill  . aspirin 81 MG tablet Take 81 mg by mouth daily.    . Brimonidine Tartrate-Timolol (COMBIGAN OP) Apply 1 drop to eye 2 (two) times daily. 1 drop both eyes bid      . carvedilol (COREG) 12.5 MG tablet Take 12.5 mg by mouth 2 (two) times daily with a meal.     . dorzolamide (TRUSOPT) 2 % ophthalmic solution Place 1 drop into the left eye 2 (two) times daily.      . furosemide (LASIX) 20 MG tablet Take 20 mg daily AS NEEDED FOR SWELLING 60 tablet 3  . latanoprost (XALATAN) 0.005 % ophthalmic solution Place 1 drop into both eyes at  bedtime.      Marland Kitchen linagliptin (TRADJENTA) 5 MG TABS tablet Take 5 mg by mouth daily.    Marland Kitchen losartan-hydrochlorothiazide (HYZAAR) 100-12.5 MG per tablet Take 1 tablet by mouth daily.    Marland Kitchen omeprazole (PRILOSEC) 20 MG capsule Take 20 mg by mouth daily.      . vitamin C (ASCORBIC ACID) 500 MG tablet Take 500 mg by mouth daily.    . vitamin E 400 UNIT capsule Take 400 Units by mouth daily.     No current facility-administered medications for this visit.     Past Surgical History  Procedure Laterality Date  . Carpal tunnel release      surgery left 05/05/2001  . Appendectomy    . Transurethral incision of prostate      resection of the prostate  . Knee arthroscopy      bilaterial; unknown associated surgical procedure  . Tonsillectomy    . Shoulder surgery      Right x2; third procedure on left     Allergies  Allergen Reactions  . Codeine   . Ezetimibe-Simvastatin     REACTION: myalgias  . Naproxen     REACTION: and all nonsteroidals  . Niacin   . Pravastatin  Sodium     REACTION: myalgias no      No family history on file.   Social History Anthony Murray reports that he quit smoking about 45 years ago. His smoking use included Cigarettes. He has a 40 pack-year smoking history. He has never used smokeless tobacco. Anthony Murray reports that he does not drink alcohol.   Review of Systems CONSTITUTIONAL: No weight loss, fever, chills, weakness or fatigue.  HEENT: Eyes: No visual loss, blurred vision, double vision or yellow sclerae.No hearing loss, sneezing, congestion, runny nose or sore throat.  SKIN: No rash or itching.  CARDIOVASCULAR: per HPI RESPIRATORY: No cough or sputum.  GASTROINTESTINAL: No anorexia, nausea, vomiting or diarrhea. No abdominal pain or blood.  GENITOURINARY: No burning on urination, no polyuria NEUROLOGICAL: No headache, dizziness, syncope, paralysis, ataxia, numbness or tingling in the extremities. No change in bowel or bladder control.    MUSCULOSKELETAL: No muscle, back pain, joint pain or stiffness.  LYMPHATICS: No enlarged nodes. No history of splenectomy.  PSYCHIATRIC: No history of depression or anxiety.  ENDOCRINOLOGIC: No reports of sweating, cold or heat intolerance. No polyuria or polydipsia.  Marland Kitchen   Physical Examination Filed Vitals:   01/10/15 1002  BP: 130/72  Pulse: 75   Filed Vitals:   01/10/15 1002  Weight: 185 lb (83.915 kg)    Gen: resting comfortably, no acute distress HEENT: no scleral icterus, pupils equal round and reactive, no palptable cervical adenopathy,  CV: RRR, 3/6 systolic murmur Resp: Clear to auscultation bilaterally GI: abdomen is soft, non-tender, non-distended, normal bowel sounds, no hepatosplenomegaly MSK: extremities are warm, no edema.  Skin: warm, no rash Neuro:  no focal deficits Psych: appropriate affect   Diagnostic Studies  09/2012 stress echo:  Study Conclusions  - Stress ECG conclusions: There were no significant stress arrhythmias or conduction abnormalities; rare PVCs were present during exercise and in recovery. The stress ECG was negative for ischemia. - Staged echo: There was no echocardiographic evidence for stress-induced ischemia.  09/2012 echo Study Conclusions  - Left ventricle: The cavity size was normal. Wall thickness was increased in a pattern of mild LVH. There was moderate focal basal hypertrophy of the septum. Systolic function was normal. The estimated ejection fraction was in the range of 55% to 60%. Wall motion was normal; there were no regional wall motion abnormalities. - Aortic valve: Mildly to moderately calcified annulus. Moderately thickened, moderately calcified leaflets. Valve mobility was mildly restricted. There was mild stenosis. Trivial regurgitation. Valve area: 0.9cm^2(VTI). Valve area: 0.79cm^2 (Vmax). - Aorta: The aorta was moderately calcified. - Mitral valve: Calcified annulus. Mildly  calcified leaflets . - Atrial septum: No defect or patent foramen ovale was identified.  11/2004 Cath FINDINGS: 1. Patient does not have dextrocardia. The heart is normally located in  the left side of the chest. Aortic arch appears normal based on the  course of the catheter through it. 2. LV 167/19/25. 3. No aortic stenosis. 4. Left main: Angiographically normal. 5. LAD: Large vessel giving rise to a single, very large, diagonal. There  is a 50% stenosis of the ostium of the LAD. 6. Circumflex: Large vessel giving rise to three obtuse marginals. There  is a 60% stenosis of the mid vessel. 7. RCA: Tortuous, dominant vessel. It is angiographically normal.  IMPRESSION: 1. No dextrocardia. 2. No previously placed coronary stent. 3. Moderate nonobstructive coronary disease. 4. Elevated left ventricular end diastolic pressure in the setting of  systemic hypertension.  08/2014 echo Study Conclusions  -  Left ventricle: The cavity size was normal. There was mild focal basal hypertrophy of the septum. Systolic function was normal. The estimated ejection fraction was in the range of 55% to 60%. Wall motion was normal; there were no regional wall motion abnormalities. Doppler parameters are consistent with abnormal left ventricular relaxation (grade 1 diastolic dysfunction). - Aortic valve: Functionally bicuspid; moderately calcified leaflets. Cusp separation was reduced. There was moderate to severe stenosis. There was mild regurgitation. Mean gradient (S): 11 mm Hg. Peak gradient (S): 22 mm Hg. VTI ratio of LVOT to aortic valve: 0.44. Valve area (VTI): 0.99 cm^2. - Mitral valve: Calcified annulus. There was trivial regurgitation. - Left atrium: The atrium was mildly dilated. - Right atrium: Central venous pressure (est): 8 mm Hg. - Tricuspid valve: There was trivial regurgitation. - Pulmonary arteries:  Systolic pressure could not be accurately estimated. - Pericardium, extracardiac: There was no pericardial effusion.  Impressions:  - Mild basal septal hypertrophy with LVEF 55-60% and grade 1 diastolic dysfunction. Mild left atrial enlargement. Functionally bicuspid aortic valve with mild aortic regurgitation and moderate to severe aortic stenosis. Measures are somewhat discordant with relatively low gradients and LVOT/AV ratio 0.44, but planimetry and VTI valve area around 1 cm2. Unable to assess PASP.    Assessment and Plan   1. CAD - no current cardiac chest pain - continue risk factor modification and secondary prevention  2. HL - intolerant to statins, continue dietary modification. Consider zetia at next visit  3. Aortic stenosis - mixed findings from last echo report - notes some signifacant DOE unclear if related to valve, however he does report significant wheezing and cough that may suggest pulmonary disease. Will obtain PFTs  F/u 1 month     [name], M.D.

## 2015-01-10 NOTE — Patient Instructions (Addendum)
Your physician wants you to follow-up in: [place] DR. BRANCH. You will receive a reminder letter in the mail two months in advance. If you don't receive a letter, please call our office to schedule the follow-up appointment.  Your physician has recommended that you have a pulmonary function test. Pulmonary Function Tests are a group of tests that measure how well air moves in and out of your lungs.   Your physician recommends that you continue on your current medications as directed. Please refer to the Current Medication list given to you today.    Thanks for choosing [facility]!!!

## 2015-01-31 ENCOUNTER — Ambulatory Visit (HOSPITAL_COMMUNITY)
Admission: RE | Admit: 2015-01-31 | Discharge: 2015-01-31 | Disposition: A | Discharge status: Home / Self Care | Payer: PPO | Source: Ambulatory Visit | Attending: Cardiology | Admitting: Cardiology

## 2015-01-31 DIAGNOSIS — R0602 Shortness of breath: Secondary | ICD-10-CM | POA: Insufficient documentation

## 2015-01-31 LAB — PULMONARY FUNCTION TEST
DL/VA % PRED: 98 %
DL/VA: 4.22 ml/min/mmHg/L
DLCO unc % pred: 59 %
DLCO unc: 15.92 ml/min/mmHg
FEF 25-75 PRE: 1.14 L/s
FEF 25-75 Post: 1.94 L/sec
FEF2575-%CHANGE-POST: 70 %
FEF2575-%PRED-POST: 145 %
FEF2575-%PRED-PRE: 84 %
FEV1-%CHANGE-POST: 21 %
FEV1-%PRED-POST: 78 %
FEV1-%PRED-PRE: 64 %
FEV1-PRE: 1.38 L
FEV1-Post: 1.68 L
FEV1FVC-%Change-Post: -4 %
FEV1FVC-%PRED-PRE: 110 %
FEV6-%Change-Post: 27 %
FEV6-%Pred-Post: 79 %
FEV6-%Pred-Pre: 62 %
FEV6-Post: 2.27 L
FEV6-Pre: 1.78 L
FEV6FVC-%PRED-PRE: 109 %
FEV6FVC-%Pred-Post: 109 %
FVC-%CHANGE-POST: 27 %
FVC-%Pred-Post: 72 %
FVC-%Pred-Pre: 56 %
FVC-Post: 2.27 L
FVC-Pre: 1.78 L
POST FEV1/FVC RATIO: 74 %
PRE FEV1/FVC RATIO: 78 %
PRE FEV6/FVC RATIO: 100 %
Post FEV6/FVC ratio: 100 %
RV % pred: 117 %
RV: 2.99 L
TLC % PRED: 77 %
TLC: 4.89 L

## 2015-01-31 MED ORDER — ALBUTEROL SULFATE (2.5 MG/3ML) 0.083% IN NEBU
2.5000 mg | INHALATION_SOLUTION | Freq: Once | RESPIRATORY_TRACT | Status: AC
  Administered 2015-01-31: 2.5 mg via RESPIRATORY_TRACT

## 2015-02-07 ENCOUNTER — Telehealth: Payer: Self-pay

## 2015-02-07 MED ORDER — ALBUTEROL SULFATE HFA 108 (90 BASE) MCG/ACT IN AERS: 2.0000 | INHALATION_SPRAY | Freq: Four times a day (QID) | RESPIRATORY_TRACT | Status: DC | PRN

## 2015-02-07 NOTE — Telephone Encounter (Signed)
Pt notified e-scribed albuterol

## 2015-02-07 NOTE — Telephone Encounter (Signed)
-----   Message from [name], MD sent at 02/07/2015 12:55 PM EDT ----- Breathing tests do show some changes suggesting some COPD. Please start him on prn albuterol inhaler  Zandra Abts MD

## 2015-02-14 ENCOUNTER — Ambulatory Visit (INDEPENDENT_AMBULATORY_CARE_PROVIDER_SITE_OTHER): Payer: PPO | Admitting: Cardiology

## 2015-02-14 ENCOUNTER — Encounter: Payer: Self-pay | Admitting: Cardiology

## 2015-02-14 VITALS — BP 167/74 | HR 70 | Ht 66.0 in | Wt 182.0 lb

## 2015-02-14 DIAGNOSIS — I350 Nonrheumatic aortic (valve) stenosis: Secondary | ICD-10-CM | POA: Diagnosis not present

## 2015-02-14 DIAGNOSIS — J449 Chronic obstructive pulmonary disease, unspecified: Secondary | ICD-10-CM | POA: Diagnosis not present

## 2015-02-14 DIAGNOSIS — R0602 Shortness of breath: Secondary | ICD-10-CM | POA: Diagnosis not present

## 2015-02-14 MED ORDER — TIOTROPIUM BROMIDE MONOHYDRATE 18 MCG IN CAPS: 18.0000 ug | ORAL_CAPSULE | Freq: Every day | RESPIRATORY_TRACT | Status: DC

## 2015-02-14 NOTE — Progress Notes (Signed)
Patient ID: GREGARY WOODLAND, male   DOB: [birth year], 81 y.o.   MRN: [id number]     Clinical Summary Mr. Mccredie is a 80 y.o.male seen today for follow up of the following medical problems. This is a focused visit on his recent symptoms of SOB.   1. Aortic stenosis - DOE at 200 feet which is stable. Denies any chest pain or syncope  2. COPD - notes wheezing and coughing associated with SOB at times - quit smoking 1971, did smoke for 20 years - last visit started prn albuterol, notes some improvement in symptoms.   Past Medical History  Diagnosis Date  . ASCVD (arteriosclerotic cardiovascular disease)     50% ostial left left anterior desending 60% mid circumflex and normal ejection fractioon in 2006 exertional dyspnea;history of chest discomfort  . Hyperlipidemia     statin intolerant  . Hypertension   . Tobacco abuse, in remission     30 pack years stopped in 1971  . Cerebrovascular disease     minimal carotid bruits  . Diabetes mellitus type 2, controlled     no insulin     Allergies  Allergen Reactions  . Codeine   . Ezetimibe-Simvastatin     REACTION: myalgias  . Naproxen     REACTION: and all nonsteroidals  . Niacin   . Pravastatin Sodium     REACTION: myalgias no     Current Outpatient Prescriptions  Medication Sig Dispense Refill  . albuterol (PROVENTIL HFA;VENTOLIN HFA) 108 (90 BASE) MCG/ACT inhaler Inhale 2 puffs into the lungs every 6 (six) hours as needed for wheezing or shortness of breath. 1 Inhaler 2  . aspirin 81 MG tablet Take 81 mg by mouth daily.    . Brimonidine Tartrate-Timolol (COMBIGAN OP) Apply 1 drop to eye 2 (two) times daily. 1 drop both eyes bid      . carvedilol (COREG) 12.5 MG tablet Take 12.5 mg by mouth 2 (two) times daily with a meal.     . dorzolamide (TRUSOPT) 2 % ophthalmic solution Place 1 drop into the left eye 2 (two) times daily.      . furosemide (LASIX) 20 MG tablet Take 20 mg daily AS NEEDED FOR SWELLING 60 tablet 3  .  glipiZIDE (GLUCOTROL XL) 2.5 MG 24 hr tablet Take 2.5 mg by mouth daily with breakfast.    . latanoprost (XALATAN) 0.005 % ophthalmic solution Place 1 drop into both eyes at bedtime.      Marland Kitchen linagliptin (TRADJENTA) 5 MG TABS tablet Take 5 mg by mouth daily.    Marland Kitchen losartan-hydrochlorothiazide (HYZAAR) 100-12.5 MG per tablet Take 1 tablet by mouth daily.    Marland Kitchen omeprazole (PRILOSEC) 20 MG capsule Take 20 mg by mouth daily.      . vitamin C (ASCORBIC ACID) 500 MG tablet Take 500 mg by mouth daily.    . vitamin E 400 UNIT capsule Take 400 Units by mouth daily.     No current facility-administered medications for this visit.     Past Surgical History  Procedure Laterality Date  . Carpal tunnel release      surgery left 05/05/2001  . Appendectomy    . Transurethral incision of prostate      resection of the prostate  . Knee arthroscopy      bilaterial; unknown associated surgical procedure  . Tonsillectomy    . Shoulder surgery      Right x2; third procedure on left     Allergies  Allergen  Reactions  . Codeine   . Ezetimibe-Simvastatin     REACTION: myalgias  . Naproxen     REACTION: and all nonsteroidals  . Niacin   . Pravastatin Sodium     REACTION: myalgias no      No family history on file.   Social History Mr. Shayne reports that he quit smoking about 45 years ago. His smoking use included Cigarettes. He has a 40 pack-year smoking history. He has never used smokeless tobacco. Mr. Nieblas reports that he does not drink alcohol.   Review of Systems CONSTITUTIONAL: No weight loss, fever, chills, weakness or fatigue.  HEENT: Eyes: No visual loss, blurred vision, double vision or yellow sclerae.No hearing loss, sneezing, congestion, runny nose or sore throat.  SKIN: No rash or itching.  CARDIOVASCULAR: no chest pain, no palpitations RESPIRATORY: No cough or sputum.  GASTROINTESTINAL: No anorexia, nausea, vomiting or diarrhea. No abdominal pain or blood.  GENITOURINARY:  No burning on urination, no polyuria NEUROLOGICAL: No headache, dizziness, syncope, paralysis, ataxia, numbness or tingling in the extremities. No change in bowel or bladder control.  MUSCULOSKELETAL: No muscle, back pain, joint pain or stiffness.  LYMPHATICS: No enlarged nodes. No history of splenectomy.  PSYCHIATRIC: No history of depression or anxiety.  ENDOCRINOLOGIC: No reports of sweating, cold or heat intolerance. No polyuria or polydipsia.  Marland Kitchen   Physical Examination Filed Vitals:   02/14/15 1043  BP: 167/74  Pulse: 70   Filed Vitals:   02/14/15 1043  Height: 5\' 6"  (1.676 m)  Weight: 182 lb (82.555 kg)    Gen: resting comfortably, no acute distress HEENT: no scleral icterus, pupils equal round and reactive, no palptable cervical adenopathy,  CV: RRR, 3/6 systolic murmur RUSB Resp: Clear to auscultation bilaterally GI: abdomen is soft, non-tender, non-distended, normal bowel sounds, no hepatosplenomegaly MSK: extremities are warm, no edema.  Skin: warm, no rash Neuro:  no focal deficits Psych: appropriate affect   Diagnostic Studies 09/2012 stress echo:  Study Conclusions  - Stress ECG conclusions: There were no significant stress arrhythmias or conduction abnormalities; rare PVCs were present during exercise and in recovery. The stress ECG was negative for ischemia. - Staged echo: There was no echocardiographic evidence for stress-induced ischemia.  09/2012 echo Study Conclusions  - Left ventricle: The cavity size was normal. Wall thickness was increased in a pattern of mild LVH. There was moderate focal basal hypertrophy of the septum. Systolic function was normal. The estimated ejection fraction was in the range of 55% to 60%. Wall motion was normal; there were no regional wall motion abnormalities. - Aortic valve: Mildly to moderately calcified annulus. Moderately thickened, moderately calcified leaflets. Valve mobility was mildly  restricted. There was mild stenosis. Trivial regurgitation. Valve area: 0.9cm^2(VTI). Valve area: 0.79cm^2 (Vmax). - Aorta: The aorta was moderately calcified. - Mitral valve: Calcified annulus. Mildly calcified leaflets . - Atrial septum: No defect or patent foramen ovale was identified.  11/2004 Cath FINDINGS: 1. Patient does not have dextrocardia. The heart is normally located in  the left side of the chest. Aortic arch appears normal based on the  course of the catheter through it. 2. LV 167/19/25. 3. No aortic stenosis. 4. Left main: Angiographically normal. 5. LAD: Large vessel giving rise to a single, very large, diagonal. There  is a 50% stenosis of the ostium of the LAD. 6. Circumflex: Large vessel giving rise to three obtuse marginals. There  is a 60% stenosis of the mid vessel. 7. RCA: Tortuous, dominant vessel. It  is angiographically normal.  IMPRESSION: 1. No dextrocardia. 2. No previously placed coronary stent. 3. Moderate nonobstructive coronary disease. 4. Elevated left ventricular end diastolic pressure in the setting of  systemic hypertension.  08/2014 echo Study Conclusions  - Left ventricle: The cavity size was normal. There was mild focal basal hypertrophy of the septum. Systolic function was normal. The estimated ejection fraction was in the range of 55% to 60%. Wall motion was normal; there were no regional wall motion abnormalities. Doppler parameters are consistent with abnormal left ventricular relaxation (grade 1 diastolic dysfunction). - Aortic valve: Functionally bicuspid; moderately calcified leaflets. Cusp separation was reduced. There was moderate to severe stenosis. There was mild regurgitation. Mean gradient (S): 11 mm Hg. Peak gradient (S): 22 mm Hg. VTI ratio of LVOT to aortic valve: 0.44. Valve area (VTI): 0.99 cm^2. - Mitral valve: Calcified annulus. There was  trivial regurgitation. - Left atrium: The atrium was mildly dilated. - Right atrium: Central venous pressure (est): 8 mm Hg. - Tricuspid valve: There was trivial regurgitation. - Pulmonary arteries: Systolic pressure could not be accurately estimated. - Pericardium, extracardiac: There was no pericardial effusion.  Impressions:  - Mild basal septal hypertrophy with LVEF 55-60% and grade 1 diastolic dysfunction. Mild left atrial enlargement. Functionally bicuspid aortic valve with mild aortic regurgitation and moderate to severe aortic stenosis. Measures are somewhat discordant with relatively low gradients and LVOT/AV ratio 0.44, but planimetry and VTI valve area around 1 cm2. Unable to assess PASP.   01/2015 PFTs Mild to moderate ventilatory defect   Assessment and Plan   1. Aortic stenosis - mixed findings from last echo report - notes some signifacant DOE that is likely more related to COPD as opposed to his AV, which was overall moderate by last echo - will follow symptoms with more aggressive treatment of his COPD  2. COPD - will start spirivia   F/u 2 months      [name], M.D.,

## 2015-02-14 NOTE — Patient Instructions (Signed)
Your physician recommends that you schedule a follow-up appointment in:  2 months with Dr Harl Bowie    Take Spiriva as directed    Keep BP log and take to primary care doctor      Thank you for choosing [facility] !

## 2015-04-22 ENCOUNTER — Ambulatory Visit: Payer: PPO | Admitting: Cardiology

## 2015-05-21 ENCOUNTER — Encounter: Payer: Self-pay | Admitting: Cardiology

## 2015-05-21 ENCOUNTER — Ambulatory Visit: Payer: PPO | Admitting: Cardiology

## 2015-05-21 ENCOUNTER — Ambulatory Visit (INDEPENDENT_AMBULATORY_CARE_PROVIDER_SITE_OTHER): Payer: PPO | Admitting: Cardiology

## 2015-05-21 VITALS — BP 136/64 | HR 72 | Ht 66.0 in | Wt 188.0 lb

## 2015-05-21 DIAGNOSIS — J449 Chronic obstructive pulmonary disease, unspecified: Secondary | ICD-10-CM

## 2015-05-21 DIAGNOSIS — R0989 Other specified symptoms and signs involving the circulatory and respiratory systems: Secondary | ICD-10-CM | POA: Diagnosis not present

## 2015-05-21 DIAGNOSIS — I350 Nonrheumatic aortic (valve) stenosis: Secondary | ICD-10-CM | POA: Diagnosis not present

## 2015-05-21 DIAGNOSIS — I2510 Atherosclerotic heart disease of native coronary artery without angina pectoris: Secondary | ICD-10-CM

## 2015-05-21 DIAGNOSIS — Z79899 Other long term (current) drug therapy: Secondary | ICD-10-CM

## 2015-05-21 MED ORDER — LOSARTAN POTASSIUM-HCTZ 100-25 MG PO TABS: 1.0000 | ORAL_TABLET | Freq: Every day | ORAL | Status: DC

## 2015-05-21 NOTE — Progress Notes (Signed)
Patient ID: Anthony Murray, male   DOB: [birth year], 81 y.o.   MRN: [id number]     Clinical Summary Anthony Murray is a 81 y.o.male seen today for follow up of the following medical problems.   1. Aortic stenosis - 08/2014 echo LVEF 55-60%, mod to severe AS (mean grad 11, AVA0.99) -  Denies any chest pain or syncope stable DOE at approx 200 feet  2. COPD - notes wheezing and coughing associated with SOB at times - quit smoking 1971, did smoke for 20 years  - notes SOB somewhat improved with inhalers  3. CAD - cath 11/2004 with moderate non-obstructive disease - echo 09/2012 LVEF 55-60% - denies any chest pain.   4. HL - reports statins have caused muscle aches including pravastatin   Past Medical History  Diagnosis Date  . ASCVD (arteriosclerotic cardiovascular disease)     50% ostial left left anterior desending 60% mid circumflex and normal ejection fractioon in 2006 exertional dyspnea;history of chest discomfort  . Hyperlipidemia     statin intolerant  . Hypertension   . Tobacco abuse, in remission     30 pack years stopped in 1971  . Cerebrovascular disease     minimal carotid bruits  . Diabetes mellitus type 2, controlled     no insulin     Allergies  Allergen Reactions  . Codeine   . Ezetimibe-Simvastatin     REACTION: myalgias  . Naproxen     REACTION: and all nonsteroidals  . Niacin   . Pravastatin Sodium     REACTION: myalgias no     Current Outpatient Prescriptions  Medication Sig Dispense Refill  . albuterol (PROVENTIL HFA;VENTOLIN HFA) 108 (90 BASE) MCG/ACT inhaler Inhale 2 puffs into the lungs every 6 (six) hours as needed for wheezing or shortness of breath. 1 Inhaler 2  . aspirin 81 MG tablet Take 81 mg by mouth daily.    . Brimonidine Tartrate-Timolol (COMBIGAN OP) Apply 1 drop to eye 2 (two) times daily. 1 drop both eyes bid      . carvedilol (COREG) 12.5 MG tablet Take 12.5 mg by mouth 2 (two) times daily with a meal.     . dorzolamide (TRUSOPT)  2 % ophthalmic solution Place 1 drop into the left eye 2 (two) times daily.      . furosemide (LASIX) 20 MG tablet Take 20 mg daily AS NEEDED FOR SWELLING 60 tablet 3  . glipiZIDE (GLUCOTROL XL) 2.5 MG 24 hr tablet Take 2.5 mg by mouth daily with breakfast.    . latanoprost (XALATAN) 0.005 % ophthalmic solution Place 1 drop into both eyes at bedtime.      Marland Kitchen linagliptin (TRADJENTA) 5 MG TABS tablet Take 5 mg by mouth daily.    Marland Kitchen losartan-hydrochlorothiazide (HYZAAR) 100-12.5 MG per tablet Take 1 tablet by mouth daily.    Marland Kitchen omeprazole (PRILOSEC) 20 MG capsule Take 20 mg by mouth daily.      Marland Kitchen tiotropium (SPIRIVA HANDIHALER) 18 MCG inhalation capsule Place 1 capsule (18 mcg total) into inhaler and inhale daily. 30 capsule 12  . vitamin C (ASCORBIC ACID) 500 MG tablet Take 500 mg by mouth daily.    . vitamin E 400 UNIT capsule Take 400 Units by mouth daily.     No current facility-administered medications for this visit.     Past Surgical History  Procedure Laterality Date  . Carpal tunnel release      surgery left 05/05/2001  . Appendectomy    .  Transurethral incision of prostate      resection of the prostate  . Knee arthroscopy      bilaterial; unknown associated surgical procedure  . Tonsillectomy    . Shoulder surgery      Right x2; third procedure on left     Allergies  Allergen Reactions  . Codeine   . Ezetimibe-Simvastatin     REACTION: myalgias  . Naproxen     REACTION: and all nonsteroidals  . Niacin   . Pravastatin Sodium     REACTION: myalgias no      No family history on file.   Social History Anthony Murray reports that he quit smoking about 45 years ago. His smoking use included Cigarettes. He has a 40 pack-year smoking history. He has never used smokeless tobacco. Anthony Murray reports that he does not drink alcohol.   Review of Systems CONSTITUTIONAL: No weight loss, fever, chills, weakness or fatigue.  HEENT: Eyes: No visual loss, blurred vision, double  vision or yellow sclerae.No hearing loss, sneezing, congestion, runny nose or sore throat.  SKIN: No rash or itching.  CARDIOVASCULAR: per HPI RESPIRATORY: No shortness of breath, cough or sputum.  GASTROINTESTINAL: No anorexia, nausea, vomiting or diarrhea. No abdominal pain or blood.  GENITOURINARY: No burning on urination, no polyuria NEUROLOGICAL: No headache, dizziness, syncope, paralysis, ataxia, numbness or tingling in the extremities. No change in bowel or bladder control.  MUSCULOSKELETAL: No muscle, back pain, joint pain or stiffness.  LYMPHATICS: No enlarged nodes. No history of splenectomy.  PSYCHIATRIC: No history of depression or anxiety.  ENDOCRINOLOGIC: No reports of sweating, cold or heat intolerance. No polyuria or polydipsia.  Marland Kitchen   Physical Examination Filed Vitals:   05/21/15 1017  BP: 136/64  Pulse: 72   Filed Vitals:   05/21/15 1017  Height: 5\' 6"  (1.676 m)  Weight: 188 lb (85.276 kg)    Gen: resting comfortably, no acute distress HEENT: no scleral icterus, pupils equal round and reactive, no palptable cervical adenopathy,  CV: RRR, 3/6 sysotlic murmur RUSB, no JVD. +bilateral bruits Resp: Clear to auscultation bilaterally GI: abdomen is soft, non-tender, non-distended, normal bowel sounds, no hepatosplenomegaly MSK: extremities are warm, 1+bilateral edema Skin: warm, no rash Neuro:  no focal deficits Psych: appropriate affect   Diagnostic Studies  09/2012 stress echo:  Study Conclusions  - Stress ECG conclusions: There were no significant stress arrhythmias or conduction abnormalities; rare PVCs were present during exercise and in recovery. The stress ECG was negative for ischemia. - Staged echo: There was no echocardiographic evidence for stress-induced ischemia.  09/2012 echo Study Conclusions  - Left ventricle: The cavity size was normal. Wall thickness was increased in a pattern of mild LVH. There was moderate focal basal  hypertrophy of the septum. Systolic function was normal. The estimated ejection fraction was in the range of 55% to 60%. Wall motion was normal; there were no regional wall motion abnormalities. - Aortic valve: Mildly to moderately calcified annulus. Moderately thickened, moderately calcified leaflets. Valve mobility was mildly restricted. There was mild stenosis. Trivial regurgitation. Valve area: 0.9cm^2(VTI). Valve area: 0.79cm^2 (Vmax). - Aorta: The aorta was moderately calcified. - Mitral valve: Calcified annulus. Mildly calcified leaflets . - Atrial septum: No defect or patent foramen ovale was identified.  11/2004 Cath FINDINGS: 1. Patient does not have dextrocardia. The heart is normally located in  the left side of the chest. Aortic arch appears normal based on the  course of the catheter through it. 2. LV 167/19/25. 3.  No aortic stenosis. 4. Left main: Angiographically normal. 5. LAD: Large vessel giving rise to a single, very large, diagonal. There  is a 50% stenosis of the ostium of the LAD. 6. Circumflex: Large vessel giving rise to three obtuse marginals. There  is a 60% stenosis of the mid vessel. 7. RCA: Tortuous, dominant vessel. It is angiographically normal.  IMPRESSION: 1. No dextrocardia. 2. No previously placed coronary stent. 3. Moderate nonobstructive coronary disease. 4. Elevated left ventricular end diastolic pressure in the setting of  systemic hypertension.  08/2014 echo Study Conclusions  - Left ventricle: The cavity size was normal. There was mild focal basal hypertrophy of the septum. Systolic function was normal. The estimated ejection fraction was in the range of 55% to 60%. Wall motion was normal; there were no regional wall motion abnormalities. Doppler parameters are consistent with abnormal left ventricular relaxation (grade 1 diastolic dysfunction). -  Aortic valve: Functionally bicuspid; moderately calcified leaflets. Cusp separation was reduced. There was moderate to severe stenosis. There was mild regurgitation. Mean gradient (S): 11 mm Hg. Peak gradient (S): 22 mm Hg. VTI ratio of LVOT to aortic valve: 0.44. Valve area (VTI): 0.99 cm^2. - Mitral valve: Calcified annulus. There was trivial regurgitation. - Left atrium: The atrium was mildly dilated. - Right atrium: Central venous pressure (est): 8 mm Hg. - Tricuspid valve: There was trivial regurgitation. - Pulmonary arteries: Systolic pressure could not be accurately estimated. - Pericardium, extracardiac: There was no pericardial effusion.  Impressions:  - Mild basal septal hypertrophy with LVEF 55-60% and grade 1 diastolic dysfunction. Mild left atrial enlargement. Functionally bicuspid aortic valve with mild aortic regurgitation and moderate to severe aortic stenosis. Measures are somewhat discordant with relatively low gradients and LVOT/AV ratio 0.44, but planimetry and VTI valve area around 1 cm2. Unable to assess PASP.   01/2015 PFTs Mild to moderate ventilatory defect   Assessment and Plan  1. Aortic stenosis - mod to severe by most recent echo. Fairly mild mean grad of 11. AVA of 0.99 but dimensionless index 0.44 - his SOB is improving with inhalers, does not appear related to valves - continue to follow  2. COPD - continue inhalers.   3. CAD - no current cardiac chest pain - continue risk factor modification and secondary prevention  4. HL - intolerant to statins, continue dietary modification.   5. HTN - above goal given his history of DM2, will increase the HCTZ component of his hyzaar to 25mg  daily. He also has some LE edema, will see if improves. Check BMET and Mg in 2 weeks.   6. Carotid bruit - order carotid US  F/u 4 months       [name], M.D.

## 2015-05-21 NOTE — Patient Instructions (Addendum)
Medication Instructions:  INCREASE HYZAAR TO 100/25 MG DAILY  Labwork: Your physician recommends that you return for lab work in: [place]   Testing/Procedures:Your physician has requested that you have a carotid duplex. This test is an ultrasound of the carotid arteries in your neck. It looks at blood flow through these arteries that supply the brain with blood. Allow one hour for this exam. There are no restrictions or special instructions.    Follow-Up: Your physician wants you to follow-up in: 4 MONTHS WITH DR. BRANCH . You will receive a reminder letter in the mail two months in advance. If you don't receive a letter, please call our office to schedule the follow-up appointment.   Any Other Special Instructions Will Be Listed Below (If Applicable).     If you need a refill on your cardiac medications before your next appointment, please call your pharmacy. Thanks for choosing [facility]!!!

## 2015-05-29 ENCOUNTER — Ambulatory Visit (HOSPITAL_COMMUNITY)
Admission: RE | Admit: 2015-05-29 | Discharge: 2015-05-29 | Disposition: A | Discharge status: Home / Self Care | Payer: PPO | Source: Ambulatory Visit | Attending: Cardiology | Admitting: Cardiology

## 2015-05-29 DIAGNOSIS — R0989 Other specified symptoms and signs involving the circulatory and respiratory systems: Secondary | ICD-10-CM | POA: Diagnosis present

## 2015-05-29 DIAGNOSIS — F17200 Nicotine dependence, unspecified, uncomplicated: Secondary | ICD-10-CM | POA: Diagnosis not present

## 2015-05-29 DIAGNOSIS — I252 Old myocardial infarction: Secondary | ICD-10-CM | POA: Insufficient documentation

## 2015-05-29 DIAGNOSIS — I779 Disorder of arteries and arterioles, unspecified: Secondary | ICD-10-CM | POA: Diagnosis not present

## 2015-05-29 DIAGNOSIS — E119 Type 2 diabetes mellitus without complications: Secondary | ICD-10-CM | POA: Diagnosis not present

## 2015-05-29 DIAGNOSIS — E785 Hyperlipidemia, unspecified: Secondary | ICD-10-CM | POA: Diagnosis not present

## 2015-05-29 DIAGNOSIS — I2510 Atherosclerotic heart disease of native coronary artery without angina pectoris: Secondary | ICD-10-CM | POA: Diagnosis not present

## 2015-06-05 LAB — BASIC METABOLIC PANEL
BUN: 29 mg/dL — ABNORMAL HIGH (ref 7–25)
CALCIUM: 9 mg/dL (ref 8.6–10.3)
CHLORIDE: 102 mmol/L (ref 98–110)
CO2: 29 mmol/L (ref 20–31)
CREATININE: 1.53 mg/dL — AB (ref 0.70–1.11)
Glucose, Bld: 116 mg/dL — ABNORMAL HIGH (ref 65–99)
Potassium: 4.5 mmol/L (ref 3.5–5.3)
SODIUM: 140 mmol/L (ref 135–146)

## 2015-06-05 LAB — MAGNESIUM: Magnesium: 1.8 mg/dL (ref 1.5–2.5)

## 2015-07-03 DIAGNOSIS — L570 Actinic keratosis: Secondary | ICD-10-CM | POA: Diagnosis not present

## 2015-07-03 DIAGNOSIS — L821 Other seborrheic keratosis: Secondary | ICD-10-CM | POA: Diagnosis not present

## 2015-07-03 DIAGNOSIS — Z85828 Personal history of other malignant neoplasm of skin: Secondary | ICD-10-CM | POA: Diagnosis not present

## 2015-07-07 ENCOUNTER — Encounter (INDEPENDENT_AMBULATORY_CARE_PROVIDER_SITE_OTHER): Payer: Self-pay | Admitting: *Deleted

## 2015-07-08 DIAGNOSIS — I10 Essential (primary) hypertension: Secondary | ICD-10-CM | POA: Diagnosis not present

## 2015-07-08 DIAGNOSIS — E1122 Type 2 diabetes mellitus with diabetic chronic kidney disease: Secondary | ICD-10-CM | POA: Diagnosis not present

## 2015-07-10 DIAGNOSIS — N183 Chronic kidney disease, stage 3 (moderate): Secondary | ICD-10-CM | POA: Diagnosis not present

## 2015-07-10 DIAGNOSIS — I10 Essential (primary) hypertension: Secondary | ICD-10-CM | POA: Diagnosis not present

## 2015-07-10 DIAGNOSIS — E782 Mixed hyperlipidemia: Secondary | ICD-10-CM | POA: Diagnosis not present

## 2015-07-10 DIAGNOSIS — J449 Chronic obstructive pulmonary disease, unspecified: Secondary | ICD-10-CM | POA: Diagnosis not present

## 2015-07-10 DIAGNOSIS — E1122 Type 2 diabetes mellitus with diabetic chronic kidney disease: Secondary | ICD-10-CM | POA: Diagnosis not present

## 2015-07-10 DIAGNOSIS — I2510 Atherosclerotic heart disease of native coronary artery without angina pectoris: Secondary | ICD-10-CM | POA: Diagnosis not present

## 2015-07-17 DIAGNOSIS — L570 Actinic keratosis: Secondary | ICD-10-CM | POA: Diagnosis not present

## 2015-08-26 DIAGNOSIS — H401123 Primary open-angle glaucoma, left eye, severe stage: Secondary | ICD-10-CM | POA: Diagnosis not present

## 2015-08-26 DIAGNOSIS — H401112 Primary open-angle glaucoma, right eye, moderate stage: Secondary | ICD-10-CM | POA: Diagnosis not present

## 2015-09-16 ENCOUNTER — Ambulatory Visit (INDEPENDENT_AMBULATORY_CARE_PROVIDER_SITE_OTHER): Payer: PPO | Admitting: Cardiology

## 2015-09-16 ENCOUNTER — Encounter: Payer: Self-pay | Admitting: Cardiology

## 2015-09-16 VITALS — BP 150/68 | HR 80 | Ht 66.0 in | Wt 195.0 lb

## 2015-09-16 DIAGNOSIS — I350 Nonrheumatic aortic (valve) stenosis: Secondary | ICD-10-CM

## 2015-09-16 DIAGNOSIS — I2510 Atherosclerotic heart disease of native coronary artery without angina pectoris: Secondary | ICD-10-CM | POA: Diagnosis not present

## 2015-09-16 DIAGNOSIS — E785 Hyperlipidemia, unspecified: Secondary | ICD-10-CM

## 2015-09-16 DIAGNOSIS — I10 Essential (primary) hypertension: Secondary | ICD-10-CM

## 2015-09-16 NOTE — Patient Instructions (Signed)
Medication Instructions:  Your physician recommends that you continue on your current medications as directed. Please refer to the Current Medication list given to you today.   Labwork: NONE  Testing/Procedures: Your physician has requested that you have an echocardiogram. Echocardiography is a painless test that uses sound waves to create images of your heart. It provides your doctor with information about the size and shape of your heart and how well your heart's chambers and valves are working. This procedure takes approximately one hour. There are no restrictions for this procedure.    Follow-Up: Your physician recommends that you schedule a follow-up appointment in: 3 MONTHS    Any Other Special Instructions Will Be Listed Below (If Applicable).  KEEP A BLOOD PRESSURE LOG FOR 2 WEEKS AND EITHER CALL OR DROP IT OFF   If you need a refill on your cardiac medications before your next appointment, please call your pharmacy.

## 2015-09-16 NOTE — Progress Notes (Signed)
Patient ID: Anthony Murray, male   DOB: 08/05/1927, 82 y.o.   MRN: [id number]     Clinical Summary Anthony Murray is an 82 y.o.male seen today for follow up of the following medical problems.    1. Aortic stenosis - 08/2014 echo LVEF 55-60%, mod to severe AS (mean grad 11, AVA0.99) - denies any chest pain, no syncope. Can have some occasional LE edema. DOE has worsened since last visit, no symptoms walking room to room at home.   2. COPD - notes wheezing and coughing associated with SOB at times - quit smoking 1971, did smoke for 20 years - notes SOB has worsened. Can have wheezing at times. Compliant with inhalers.   3. CAD - cath 11/2004 with moderate non-obstructive disease - echo 09/2012 LVEF 55-60% - denies any chest pain since last visit  4. HL - reports statins have caused muscle aches including pravastatin  - Jan 2016 TC 173 TG 238 HDL 29 LDL 96 - reports labs with pcp for next month  5. Carotid stenosis - carotid US [id number] with 0000000 LICA and RICA without significant stenosis.  - denies any neuro symptoms   6. HTN - has not taken meds yet today  Past Medical History  Diagnosis Date  . ASCVD (arteriosclerotic cardiovascular disease)     50% ostial left left anterior desending 60% mid circumflex and normal ejection fractioon in 2006 exertional dyspnea;history of chest discomfort  . Hyperlipidemia     statin intolerant  . Hypertension   . Tobacco abuse, in remission     30 pack years stopped in 1971  . Cerebrovascular disease     minimal carotid bruits  . Diabetes mellitus type 2, controlled ([place])     no insulin     Allergies  Allergen Reactions  . Codeine   . Ezetimibe-Simvastatin     REACTION: myalgias  . Naproxen     REACTION: and all nonsteroidals  . Niacin   . Pravastatin Sodium     REACTION: myalgias no     Current Outpatient Prescriptions  Medication Sig Dispense Refill  . aspirin 81 MG tablet Take 81 mg by mouth daily.    . Brimonidine  Tartrate-Timolol (COMBIGAN OP) Apply 1 drop to eye 2 (two) times daily. 1 drop both eyes bid      . carvedilol (COREG) 12.5 MG tablet Take 12.5 mg by mouth 2 (two) times daily with a meal.     . dorzolamide (TRUSOPT) 2 % ophthalmic solution Place 1 drop into the left eye 2 (two) times daily.      Marland Kitchen glipiZIDE (GLUCOTROL XL) 2.5 MG 24 hr tablet Take 2.5 mg by mouth daily with breakfast.    . latanoprost (XALATAN) 0.005 % ophthalmic solution Place 1 drop into both eyes at bedtime.      Marland Kitchen linagliptin (TRADJENTA) 5 MG TABS tablet Take 5 mg by mouth daily.    Marland Kitchen losartan-hydrochlorothiazide (HYZAAR) 100-25 MG tablet Take 1 tablet by mouth daily. 90 tablet 3  . omeprazole (PRILOSEC) 20 MG capsule Take 20 mg by mouth daily.      Marland Kitchen tiotropium (SPIRIVA HANDIHALER) 18 MCG inhalation capsule Place 1 capsule (18 mcg total) into inhaler and inhale daily. 30 capsule 12  . vitamin C (ASCORBIC ACID) 500 MG tablet Take 500 mg by mouth daily.    . vitamin E 400 UNIT capsule Take 400 Units by mouth daily.     No current facility-administered medications for this visit.  Past Surgical History  Procedure Laterality Date  . Carpal tunnel release      surgery left 05/05/2001  . Appendectomy    . Transurethral incision of prostate      resection of the prostate  . Knee arthroscopy      bilaterial; unknown associated surgical procedure  . Tonsillectomy    . Shoulder surgery      Right x2; third procedure on left     Allergies  Allergen Reactions  . Codeine   . Ezetimibe-Simvastatin     REACTION: myalgias  . Naproxen     REACTION: and all nonsteroidals  . Niacin   . Pravastatin Sodium     REACTION: myalgias no      No family history on file.   Social History Anthony Murray reports that he quit smoking about 45 years ago. His smoking use included Cigarettes. He has a 40 pack-year smoking history. He has never used smokeless tobacco. Anthony Murray reports that he does not drink  alcohol.   Review of Systems CONSTITUTIONAL: No weight loss, fever, chills, weakness or fatigue.  HEENT: Eyes: No visual loss, blurred vision, double vision or yellow sclerae.No hearing loss, sneezing, congestion, runny nose or sore throat.  SKIN: No rash or itching.  CARDIOVASCULAR: per HPI RESPIRATORY: per HPI GASTROINTESTINAL: No anorexia, nausea, vomiting or diarrhea. No abdominal pain or blood.  GENITOURINARY: No burning on urination, no polyuria NEUROLOGICAL: No headache, dizziness, syncope, paralysis, ataxia, numbness or tingling in the extremities. No change in bowel or bladder control.  MUSCULOSKELETAL: No muscle, back pain, joint pain or stiffness.  LYMPHATICS: No enlarged nodes. No history of splenectomy.  PSYCHIATRIC: No history of depression or anxiety.  ENDOCRINOLOGIC: No reports of sweating, cold or heat intolerance. No polyuria or polydipsia.  Marland Kitchen   Physical Examination Filed Vitals:   09/16/15 0856  BP: 150/68  Pulse: 80   Filed Vitals:   09/16/15 0856  Height: 5\' 6"  (1.676 m)  Weight: 195 lb (88.451 kg)    Gen: resting comfortably, no acute distress HEENT: no scleral icterus, pupils equal round and reactive, no palptable cervical adenopathy,  CV: RRR, 2/6 systolic murmur RUSB, no jvd Resp: Clear to auscultation bilaterally GI: abdomen is soft, non-tender, non-distended, normal bowel sounds, no hepatosplenomegaly MSK: extremities are warm, no edema.  Skin: warm, no rash Neuro:  no focal deficits Psych: appropriate affect   Diagnostic Studies 09/2012 stress echo:  Study Conclusions  - Stress ECG conclusions: There were no significant stress arrhythmias or conduction abnormalities; rare PVCs were present during exercise and in recovery. The stress ECG was negative for ischemia. - Staged echo: There was no echocardiographic evidence for stress-induced ischemia.  09/2012 echo Study Conclusions  - Left ventricle: The cavity size was normal.  Wall thickness was increased in a pattern of mild LVH. There was moderate focal basal hypertrophy of the septum. Systolic function was normal. The estimated ejection fraction was in the range of 55% to 60%. Wall motion was normal; there were no regional wall motion abnormalities. - Aortic valve: Mildly to moderately calcified annulus. Moderately thickened, moderately calcified leaflets. Valve mobility was mildly restricted. There was mild stenosis. Trivial regurgitation. Valve area: 0.9cm^2(VTI). Valve area: 0.79cm^2 (Vmax). - Aorta: The aorta was moderately calcified. - Mitral valve: Calcified annulus. Mildly calcified leaflets . - Atrial septum: No defect or patent foramen ovale was identified.  11/2004 Cath FINDINGS: 1. Patient does not have dextrocardia. The heart is normally located in  the left side of the  chest. Aortic arch appears normal based on the  course of the catheter through it. 2. LV 167/19/25. 3. No aortic stenosis. 4. Left main: Angiographically normal. 5. LAD: Large vessel giving rise to a single, very large, diagonal. There  is a 50% stenosis of the ostium of the LAD. 6. Circumflex: Large vessel giving rise to three obtuse marginals. There  is a 60% stenosis of the mid vessel. 7. RCA: Tortuous, dominant vessel. It is angiographically normal.  IMPRESSION: 1. No dextrocardia. 2. No previously placed coronary stent. 3. Moderate nonobstructive coronary disease. 4. Elevated left ventricular end diastolic pressure in the setting of  systemic hypertension.  08/2014 echo Study Conclusions  - Left ventricle: The cavity size was normal. There was mild focal basal hypertrophy of the septum. Systolic function was normal. The estimated ejection fraction was in the range of 55% to 60%. Wall motion was normal; there were no regional wall motion abnormalities. Doppler parameters are  consistent with abnormal left ventricular relaxation (grade 1 diastolic dysfunction). - Aortic valve: Functionally bicuspid; moderately calcified leaflets. Cusp separation was reduced. There was moderate to severe stenosis. There was mild regurgitation. Mean gradient (S): 11 mm Hg. Peak gradient (S): 22 mm Hg. VTI ratio of LVOT to aortic valve: 0.44. Valve area (VTI): 0.99 cm^2. - Mitral valve: Calcified annulus. There was trivial regurgitation. - Left atrium: The atrium was mildly dilated. - Right atrium: Central venous pressure (est): 8 mm Hg. - Tricuspid valve: There was trivial regurgitation. - Pulmonary arteries: Systolic pressure could not be accurately estimated. - Pericardium, extracardiac: There was no pericardial effusion.  Impressions:  - Mild basal septal hypertrophy with LVEF 55-60% and grade 1 diastolic dysfunction. Mild left atrial enlargement. Functionally bicuspid aortic valve with mild aortic regurgitation and moderate to severe aortic stenosis. Measures are somewhat discordant with relatively low gradients and LVOT/AV ratio 0.44, but planimetry and VTI valve area around 1 cm2. Unable to assess PASP.   01/2015 PFTs Mild to moderate ventilatory defect      Assessment and Plan  1. Aortic stenosis - mod to severe by most recent echo. Fairly mild mean grad of 11. AVA of 0.99 but dimensionless index 0.44 - given progression of DOE will repeat echo.   2. COPD - continue inhalers, continue to follow with pcp  3. CAD - no current cardiac chest pain - EKG in clinic sinus rhythm, occasional PVCs, no ishcemic changes - continue currnent meds  4. HL - intolerant to statins, continue dietary modification. Last panel was reasonable, would not pursue zetia or pcsk-9 inhibitor at this time. Reports upcoming labs with pcp  5. HTN - elevated in clinic but he has not taken his meds yet today, he will submit a bp log in 2 weeks.   6. Carotid  stenosis - repeat carotid US 05/2016  F/u 3 months      [name], M.D.

## 2015-09-19 ENCOUNTER — Ambulatory Visit (HOSPITAL_COMMUNITY)
Admission: RE | Admit: 2015-09-19 | Discharge: 2015-09-19 | Disposition: A | Discharge status: Home / Self Care | Payer: PPO | Source: Ambulatory Visit | Attending: Cardiology | Admitting: Cardiology

## 2015-09-19 DIAGNOSIS — I071 Rheumatic tricuspid insufficiency: Secondary | ICD-10-CM | POA: Diagnosis not present

## 2015-09-19 DIAGNOSIS — E119 Type 2 diabetes mellitus without complications: Secondary | ICD-10-CM | POA: Insufficient documentation

## 2015-09-19 DIAGNOSIS — I352 Nonrheumatic aortic (valve) stenosis with insufficiency: Secondary | ICD-10-CM | POA: Diagnosis not present

## 2015-09-19 DIAGNOSIS — I350 Nonrheumatic aortic (valve) stenosis: Secondary | ICD-10-CM | POA: Diagnosis not present

## 2015-09-19 DIAGNOSIS — I340 Nonrheumatic mitral (valve) insufficiency: Secondary | ICD-10-CM | POA: Diagnosis not present

## 2015-09-19 DIAGNOSIS — Q231 Congenital insufficiency of aortic valve: Secondary | ICD-10-CM | POA: Diagnosis not present

## 2015-09-19 DIAGNOSIS — I119 Hypertensive heart disease without heart failure: Secondary | ICD-10-CM | POA: Diagnosis not present

## 2015-09-23 ENCOUNTER — Telehealth: Payer: Self-pay

## 2015-09-23 NOTE — Telephone Encounter (Signed)
-----   Message from [name], MD sent at 09/23/2015  2:57 PM EDT ----- Echo is overall stable, no significant changes. Heart pumping function remains strong, aortic valve remains moderately thickened but overall working ok  Zandra Abts MD

## 2015-09-23 NOTE — Telephone Encounter (Signed)
Called pt, no anxswer- lmtcb. 4/4- lm

## 2015-09-24 DIAGNOSIS — N183 Chronic kidney disease, stage 3 (moderate): Secondary | ICD-10-CM | POA: Diagnosis not present

## 2015-09-24 DIAGNOSIS — I10 Essential (primary) hypertension: Secondary | ICD-10-CM | POA: Diagnosis not present

## 2015-09-24 DIAGNOSIS — E1122 Type 2 diabetes mellitus with diabetic chronic kidney disease: Secondary | ICD-10-CM | POA: Diagnosis not present

## 2015-10-01 DIAGNOSIS — H04123 Dry eye syndrome of bilateral lacrimal glands: Secondary | ICD-10-CM | POA: Diagnosis not present

## 2015-10-01 DIAGNOSIS — Z961 Presence of intraocular lens: Secondary | ICD-10-CM | POA: Diagnosis not present

## 2015-10-01 DIAGNOSIS — H401112 Primary open-angle glaucoma, right eye, moderate stage: Secondary | ICD-10-CM | POA: Diagnosis not present

## 2015-10-01 DIAGNOSIS — H401123 Primary open-angle glaucoma, left eye, severe stage: Secondary | ICD-10-CM | POA: Diagnosis not present

## 2015-10-14 DIAGNOSIS — E1122 Type 2 diabetes mellitus with diabetic chronic kidney disease: Secondary | ICD-10-CM | POA: Diagnosis not present

## 2015-10-14 DIAGNOSIS — I10 Essential (primary) hypertension: Secondary | ICD-10-CM | POA: Diagnosis not present

## 2015-10-14 DIAGNOSIS — N183 Chronic kidney disease, stage 3 (moderate): Secondary | ICD-10-CM | POA: Diagnosis not present

## 2015-11-12 DIAGNOSIS — E1122 Type 2 diabetes mellitus with diabetic chronic kidney disease: Secondary | ICD-10-CM | POA: Diagnosis not present

## 2015-11-12 DIAGNOSIS — I10 Essential (primary) hypertension: Secondary | ICD-10-CM | POA: Diagnosis not present

## 2015-11-14 DIAGNOSIS — N183 Chronic kidney disease, stage 3 (moderate): Secondary | ICD-10-CM | POA: Diagnosis not present

## 2015-11-14 DIAGNOSIS — E1122 Type 2 diabetes mellitus with diabetic chronic kidney disease: Secondary | ICD-10-CM | POA: Diagnosis not present

## 2015-11-14 DIAGNOSIS — I2510 Atherosclerotic heart disease of native coronary artery without angina pectoris: Secondary | ICD-10-CM | POA: Diagnosis not present

## 2015-11-14 DIAGNOSIS — J449 Chronic obstructive pulmonary disease, unspecified: Secondary | ICD-10-CM | POA: Diagnosis not present

## 2015-12-10 DIAGNOSIS — N183 Chronic kidney disease, stage 3 (moderate): Secondary | ICD-10-CM | POA: Diagnosis not present

## 2015-12-10 DIAGNOSIS — I10 Essential (primary) hypertension: Secondary | ICD-10-CM | POA: Diagnosis not present

## 2015-12-10 DIAGNOSIS — E1122 Type 2 diabetes mellitus with diabetic chronic kidney disease: Secondary | ICD-10-CM | POA: Diagnosis not present

## 2015-12-10 DIAGNOSIS — I2510 Atherosclerotic heart disease of native coronary artery without angina pectoris: Secondary | ICD-10-CM | POA: Diagnosis not present

## 2015-12-17 DIAGNOSIS — D1801 Hemangioma of skin and subcutaneous tissue: Secondary | ICD-10-CM | POA: Diagnosis not present

## 2015-12-17 DIAGNOSIS — D225 Melanocytic nevi of trunk: Secondary | ICD-10-CM | POA: Diagnosis not present

## 2015-12-17 DIAGNOSIS — L812 Freckles: Secondary | ICD-10-CM | POA: Diagnosis not present

## 2015-12-17 DIAGNOSIS — B351 Tinea unguium: Secondary | ICD-10-CM | POA: Diagnosis not present

## 2015-12-17 DIAGNOSIS — Z85828 Personal history of other malignant neoplasm of skin: Secondary | ICD-10-CM | POA: Diagnosis not present

## 2015-12-17 DIAGNOSIS — L821 Other seborrheic keratosis: Secondary | ICD-10-CM | POA: Diagnosis not present

## 2015-12-17 DIAGNOSIS — B354 Tinea corporis: Secondary | ICD-10-CM | POA: Diagnosis not present

## 2015-12-17 DIAGNOSIS — B353 Tinea pedis: Secondary | ICD-10-CM | POA: Diagnosis not present

## 2015-12-19 ENCOUNTER — Encounter: Payer: Self-pay | Admitting: Cardiology

## 2015-12-19 ENCOUNTER — Ambulatory Visit (INDEPENDENT_AMBULATORY_CARE_PROVIDER_SITE_OTHER): Payer: PPO | Admitting: Cardiology

## 2015-12-19 VITALS — BP 148/80 | HR 67 | Ht 66.5 in | Wt 196.0 lb

## 2015-12-19 DIAGNOSIS — I6523 Occlusion and stenosis of bilateral carotid arteries: Secondary | ICD-10-CM | POA: Diagnosis not present

## 2015-12-19 DIAGNOSIS — I350 Nonrheumatic aortic (valve) stenosis: Secondary | ICD-10-CM

## 2015-12-19 DIAGNOSIS — E785 Hyperlipidemia, unspecified: Secondary | ICD-10-CM | POA: Diagnosis not present

## 2015-12-19 DIAGNOSIS — I2510 Atherosclerotic heart disease of native coronary artery without angina pectoris: Secondary | ICD-10-CM | POA: Diagnosis not present

## 2015-12-19 NOTE — Progress Notes (Signed)
Clinical Summary Anthony Murray is a 82 y.o.male seen today for follow up of the following medical problems.   1. Aortic stenosis - 08/2014 echo LVEF 55-60%, mod to severe AS (mean grad 11, AVA0.99) 08/2015 echo LVEF 123456, grade I diastolic dysfunction, mean grad 10, dimensionless index 0.39, AVA VTI 0.88  - reports stable SOB he attributes to COPD. Can have some coughing/wheezing at times. No chest pain, no syncope.   2. COPD - notes wheezing and coughing associated with SOB at times - quit smoking 1971, did smoke for 20 years - chronic SOB and wheezing unchanged since last visit.   3. CAD - cath 11/2004 with moderate non-obstructive disease - echo 09/2012 LVEF 55-60%  - denies any chest pain since last visit  4. HL - reports statins have caused muscle aches including pravastatin  - Jan 2016 TC 173 TG 238 HDL 29 LDL 96 - reports labs with pcp for next month  5. Carotid stenosis - carotid US [id number] with 0000000 LICA and RICA without significant stenosis.  - denies any neuro symptoms   6. HTN - has not taken meds yet today. Home bp's show 130s/70s on average.       SH: enjoys working on garden.    Past Medical History  Diagnosis Date  . ASCVD (arteriosclerotic cardiovascular disease)     50% ostial left left anterior desending 60% mid circumflex and normal ejection fractioon in 2006 exertional dyspnea;history of chest discomfort  . Hyperlipidemia     statin intolerant  . Hypertension   . Tobacco abuse, in remission     30 pack years stopped in 1971  . Cerebrovascular disease     minimal carotid bruits  . Diabetes mellitus type 2, controlled ([place])     no insulin     Allergies  Allergen Reactions  . Codeine   . Ezetimibe-Simvastatin     REACTION: myalgias  . Naproxen     REACTION: and all nonsteroidals  . Niacin   . Pravastatin Sodium     REACTION: myalgias no     Current Outpatient Prescriptions  Medication Sig Dispense Refill  . aspirin 81  MG tablet Take 81 mg by mouth daily.    . Brimonidine Tartrate-Timolol (COMBIGAN OP) Apply 1 drop to eye 2 (two) times daily. 1 drop both eyes bid      . carvedilol (COREG) 12.5 MG tablet Take 12.5 mg by mouth 2 (two) times daily with a meal.     . dorzolamide (TRUSOPT) 2 % ophthalmic solution Place 1 drop into the left eye 2 (two) times daily.      Marland Kitchen glipiZIDE (GLUCOTROL XL) 2.5 MG 24 hr tablet Take 2.5 mg by mouth daily with breakfast.    . latanoprost (XALATAN) 0.005 % ophthalmic solution Place 1 drop into both eyes at bedtime.      Marland Kitchen linagliptin (TRADJENTA) 5 MG TABS tablet Take 5 mg by mouth daily.    Marland Kitchen losartan-hydrochlorothiazide (HYZAAR) 100-25 MG tablet Take 1 tablet by mouth daily. 90 tablet 3  . omeprazole (PRILOSEC) 20 MG capsule Take 20 mg by mouth daily.      Marland Kitchen tiotropium (SPIRIVA HANDIHALER) 18 MCG inhalation capsule Place 1 capsule (18 mcg total) into inhaler and inhale daily. 30 capsule 12  . vitamin C (ASCORBIC ACID) 500 MG tablet Take 500 mg by mouth daily.    . vitamin E 400 UNIT capsule Take 400 Units by mouth daily.     No current facility-administered  medications for this visit.     Past Surgical History  Procedure Laterality Date  . Carpal tunnel release      surgery left 05/05/2001  . Appendectomy    . Transurethral incision of prostate      resection of the prostate  . Knee arthroscopy      bilaterial; unknown associated surgical procedure  . Tonsillectomy    . Shoulder surgery      Right x2; third procedure on left     Allergies  Allergen Reactions  . Codeine   . Ezetimibe-Simvastatin     REACTION: myalgias  . Naproxen     REACTION: and all nonsteroidals  . Niacin   . Pravastatin Sodium     REACTION: myalgias no      No family history on file.   Social History Anthony Murray reports that he quit smoking about 46 years ago. His smoking use included Cigarettes. He has a 40 pack-year smoking history. He has never used smokeless tobacco. Mr.  Murray reports that he does not drink alcohol.   Review of Systems CONSTITUTIONAL: No weight loss, fever, chills, weakness or fatigue.  HEENT: Eyes: No visual loss, blurred vision, double vision or yellow sclerae.No hearing loss, sneezing, congestion, runny nose or sore throat.  SKIN: No rash or itching.  CARDIOVASCULAR: per HPI RESPIRATORY: No shortness of breath, cough or sputum.  GASTROINTESTINAL: No anorexia, nausea, vomiting or diarrhea. No abdominal pain or blood.  GENITOURINARY: No burning on urination, no polyuria NEUROLOGICAL: No headache, dizziness, syncope, paralysis, ataxia, numbness or tingling in the extremities. No change in bowel or bladder control.  MUSCULOSKELETAL: No muscle, back pain, joint pain or stiffness.  LYMPHATICS: No enlarged nodes. No history of splenectomy.  PSYCHIATRIC: No history of depression or anxiety.  ENDOCRINOLOGIC: No reports of sweating, cold or heat intolerance. No polyuria or polydipsia.  Marland Kitchen   Physical Examination Filed Vitals:   12/19/15 0817  BP: 148/80  Pulse: 67   Filed Vitals:   12/19/15 0817  Height: 5' 6.5" (1.689 m)  Weight: 196 lb (88.905 kg)    Gen: resting comfortably, no acute distress HEENT: no scleral icterus, pupils equal round and reactive, no palptable cervical adenopathy,  CV: [id number] systolic murmur RUSB, no jvd Resp: Clear to auscultation bilaterally GI: abdomen is soft, non-tender, non-distended, normal bowel sounds, no hepatosplenomegaly MSK: extremities are warm, no edema.  Skin: warm, no rash Neuro:  no focal deficits Psych: appropriate affect   Diagnostic Studies 09/2012 stress echo:  Study Conclusions  - Stress ECG conclusions: There were no significant stress arrhythmias or conduction abnormalities; rare PVCs were present during exercise and in recovery. The stress ECG was negative for ischemia. - Staged echo: There was no echocardiographic evidence for stress-induced ischemia.  09/2012  echo Study Conclusions  - Left ventricle: The cavity size was normal. Wall thickness was increased in a pattern of mild LVH. There was moderate focal basal hypertrophy of the septum. Systolic function was normal. The estimated ejection fraction was in the range of 55% to 60%. Wall motion was normal; there were no regional wall motion abnormalities. - Aortic valve: Mildly to moderately calcified annulus. Moderately thickened, moderately calcified leaflets. Valve mobility was mildly restricted. There was mild stenosis. Trivial regurgitation. Valve area: 0.9cm^2(VTI). Valve area: 0.79cm^2 (Vmax). - Aorta: The aorta was moderately calcified. - Mitral valve: Calcified annulus. Mildly calcified leaflets . - Atrial septum: No defect or patent foramen ovale was identified.  11/2004 Cath FINDINGS: 1. Patient does not have  dextrocardia. The heart is normally located in  the left side of the chest. Aortic arch appears normal based on the  course of the catheter through it. 2. LV 167/19/25. 3. No aortic stenosis. 4. Left main: Angiographically normal. 5. LAD: Large vessel giving rise to a single, very large, diagonal. There  is a 50% stenosis of the ostium of the LAD. 6. Circumflex: Large vessel giving rise to three obtuse marginals. There  is a 60% stenosis of the mid vessel. 7. RCA: Tortuous, dominant vessel. It is angiographically normal.  IMPRESSION: 1. No dextrocardia. 2. No previously placed coronary stent. 3. Moderate nonobstructive coronary disease. 4. Elevated left ventricular end diastolic pressure in the setting of  systemic hypertension.  08/2014 echo Study Conclusions  - Left ventricle: The cavity size was normal. There was mild focal basal hypertrophy of the septum. Systolic function was normal. The estimated ejection fraction was in the range of 55% to 60%. Wall motion was normal; there  were no regional wall motion abnormalities. Doppler parameters are consistent with abnormal left ventricular relaxation (grade 1 diastolic dysfunction). - Aortic valve: Functionally bicuspid; moderately calcified leaflets. Cusp separation was reduced. There was moderate to severe stenosis. There was mild regurgitation. Mean gradient (S): 11 mm Hg. Peak gradient (S): 22 mm Hg. VTI ratio of LVOT to aortic valve: 0.44. Valve area (VTI): 0.99 cm^2. - Mitral valve: Calcified annulus. There was trivial regurgitation. - Left atrium: The atrium was mildly dilated. - Right atrium: Central venous pressure (est): 8 mm Hg. - Tricuspid valve: There was trivial regurgitation. - Pulmonary arteries: Systolic pressure could not be accurately estimated. - Pericardium, extracardiac: There was no pericardial effusion.  Impressions:  - Mild basal septal hypertrophy with LVEF 55-60% and grade 1 diastolic dysfunction. Mild left atrial enlargement. Functionally bicuspid aortic valve with mild aortic regurgitation and moderate to severe aortic stenosis. Measures are somewhat discordant with relatively low gradients and LVOT/AV ratio 0.44, but planimetry and VTI valve area around 1 cm2. Unable to assess PASP.   01/2015 PFTs Mild to moderate ventilatory defect   08/2015 echo  Study Conclusions  - Left ventricle: The cavity size was normal. Wall thickness was  increased in a pattern of mild LVH. Systolic function was normal.  The estimated ejection fraction was in the range of 60% to 65%.  Wall motion was normal; there were no regional wall motion  abnormalities. Doppler parameters are consistent with abnormal  left ventricular relaxation (grade 1 diastolic dysfunction). - Aortic valve: Mildly to moderately calcified annulus.  Functionally bicuspid. There was mild regurgitation. Mean  gradient (S): 10 mm Hg. Peak gradient (S): 20 mm Hg. VTI ratio of  LVOT to aortic  valve: 0.39. Valve area (VTI): 0.88 cm^2. - Mitral valve: Calcified annulus. There was trivial regurgitation. - Left atrium: The atrium was at the upper limits of normal in  size. - Right atrium: Central venous pressure (est): 3 mm Hg. - Tricuspid valve: There was trivial regurgitation. - Pulmonary arteries: Systolic pressure could not be accurately  estimated. - Pericardium, extracardiac: There was no pericardial effusion.  Impressions:  - Mild LVH with LVEF 60-65%. Grade 1 diastolic dysfunction with  normal estimated LV filling pressure. Upper normal left atrial  chamber size. MAC with trivial mitral regurgitation. Functionally  bicuspid, calcified aortic valve with at least moderate aortic  stenosis. As mentioned in the prior study, measurements are  discordant with relatively low gradients but LVOT/AV VTI ratio  0.39. Unable to get good valve planimetry on  this particular  study. There is mild aortic regurgitation.   Assessment and Plan    1. Aortic stenosis - mod to severe by most recent echo, the majority of criteria support moderate AS. He has no symptoms. Continue to monitor.    2. COPD - continue inhalers, continue to follow with pcp  3. CAD - no current chest pain - we will continue currnent meds  4. HL - intolerant to statins, continue dietary modification. Lipids have been reasnoably controlled, in the future consider zetia if significant increase in LDL  5. HTN - home numbers at goal, continue current meds  6. Carotid stenosis -we will  repeat carotid US 05/2016  F/u 6 months    [name], M.D.

## 2015-12-19 NOTE — Patient Instructions (Signed)
Medication Instructions:  Your physician recommends that you continue on your current medications as directed. Please refer to the Current Medication list given to you today.    Labwork: none  Testing/Procedures: none  Follow-Up: Your physician wants you to follow-up in: 6 months with Dr. Harl Bowie.  You will receive a reminder letter in the mail two months in advance. If you don't receive a letter, please call our office to schedule the follow-up appointment.   Any Other Special Instructions Will Be Listed Below (If Applicable).     If you need a refill on your cardiac medications before your next appointment, please call your pharmacy.  Thanks for choosing [facility]!!!

## 2016-01-24 ENCOUNTER — Emergency Department (HOSPITAL_COMMUNITY): Payer: PPO

## 2016-01-24 ENCOUNTER — Inpatient Hospital Stay (HOSPITAL_COMMUNITY)
Admission: EM | Admit: 2016-01-24 | Discharge: 2016-01-27 | DRG: 872 | Disposition: A | Discharge status: Home / Self Care | Payer: PPO | Attending: Family Medicine | Admitting: Family Medicine

## 2016-01-24 ENCOUNTER — Encounter (HOSPITAL_COMMUNITY): Payer: Self-pay | Admitting: Emergency Medicine

## 2016-01-24 DIAGNOSIS — M545 Low back pain: Secondary | ICD-10-CM | POA: Diagnosis present

## 2016-01-24 DIAGNOSIS — Z8673 Personal history of transient ischemic attack (TIA), and cerebral infarction without residual deficits: Secondary | ICD-10-CM

## 2016-01-24 DIAGNOSIS — E872 Acidosis: Secondary | ICD-10-CM | POA: Diagnosis not present

## 2016-01-24 DIAGNOSIS — E785 Hyperlipidemia, unspecified: Secondary | ICD-10-CM | POA: Diagnosis present

## 2016-01-24 DIAGNOSIS — E1122 Type 2 diabetes mellitus with diabetic chronic kidney disease: Secondary | ICD-10-CM | POA: Diagnosis not present

## 2016-01-24 DIAGNOSIS — I10 Essential (primary) hypertension: Secondary | ICD-10-CM

## 2016-01-24 DIAGNOSIS — M5489 Other dorsalgia: Secondary | ICD-10-CM | POA: Diagnosis not present

## 2016-01-24 DIAGNOSIS — B9620 Unspecified Escherichia coli [E. coli] as the cause of diseases classified elsewhere: Secondary | ICD-10-CM | POA: Diagnosis present

## 2016-01-24 DIAGNOSIS — E118 Type 2 diabetes mellitus with unspecified complications: Secondary | ICD-10-CM

## 2016-01-24 DIAGNOSIS — N183 Chronic kidney disease, stage 3 unspecified: Secondary | ICD-10-CM | POA: Diagnosis present

## 2016-01-24 DIAGNOSIS — Z7982 Long term (current) use of aspirin: Secondary | ICD-10-CM

## 2016-01-24 DIAGNOSIS — N390 Urinary tract infection, site not specified: Secondary | ICD-10-CM | POA: Diagnosis present

## 2016-01-24 DIAGNOSIS — A419 Sepsis, unspecified organism: Principal | ICD-10-CM | POA: Diagnosis present

## 2016-01-24 DIAGNOSIS — E119 Type 2 diabetes mellitus without complications: Secondary | ICD-10-CM | POA: Diagnosis not present

## 2016-01-24 DIAGNOSIS — I2510 Atherosclerotic heart disease of native coronary artery without angina pectoris: Secondary | ICD-10-CM | POA: Diagnosis present

## 2016-01-24 DIAGNOSIS — Z87891 Personal history of nicotine dependence: Secondary | ICD-10-CM

## 2016-01-24 DIAGNOSIS — I129 Hypertensive chronic kidney disease with stage 1 through stage 4 chronic kidney disease, or unspecified chronic kidney disease: Secondary | ICD-10-CM | POA: Diagnosis present

## 2016-01-24 DIAGNOSIS — R531 Weakness: Secondary | ICD-10-CM | POA: Diagnosis not present

## 2016-01-24 DIAGNOSIS — Z7951 Long term (current) use of inhaled steroids: Secondary | ICD-10-CM | POA: Diagnosis not present

## 2016-01-24 DIAGNOSIS — E876 Hypokalemia: Secondary | ICD-10-CM | POA: Diagnosis present

## 2016-01-24 DIAGNOSIS — Z7984 Long term (current) use of oral hypoglycemic drugs: Secondary | ICD-10-CM | POA: Diagnosis not present

## 2016-01-24 DIAGNOSIS — R0682 Tachypnea, not elsewhere classified: Secondary | ICD-10-CM | POA: Diagnosis not present

## 2016-01-24 DIAGNOSIS — R509 Fever, unspecified: Secondary | ICD-10-CM | POA: Diagnosis not present

## 2016-01-24 DIAGNOSIS — S3992XA Unspecified injury of lower back, initial encounter: Secondary | ICD-10-CM | POA: Diagnosis not present

## 2016-01-24 LAB — COMPREHENSIVE METABOLIC PANEL
ALBUMIN: 4.3 g/dL (ref 3.5–5.0)
ALT: 39 U/L (ref 17–63)
ANION GAP: 13 (ref 5–15)
AST: 29 U/L (ref 15–41)
Alkaline Phosphatase: 55 U/L (ref 38–126)
BUN: 20 mg/dL (ref 6–20)
CHLORIDE: 103 mmol/L (ref 101–111)
CO2: 23 mmol/L (ref 22–32)
Calcium: 9.6 mg/dL (ref 8.9–10.3)
Creatinine, Ser: 1.46 mg/dL — ABNORMAL HIGH (ref 0.61–1.24)
GFR calc non Af Amer: 42 mL/min — ABNORMAL LOW (ref >60)
GFR, EST AFRICAN AMERICAN: 48 mL/min — AB (ref >60)
Glucose, Bld: 140 mg/dL — ABNORMAL HIGH (ref 65–99)
POTASSIUM: 3.9 mmol/L (ref 3.5–5.1)
SODIUM: 139 mmol/L (ref 135–145)
Total Bilirubin: 1 mg/dL (ref 0.3–1.2)
Total Protein: 7.4 g/dL (ref 6.5–8.1)

## 2016-01-24 LAB — CBC WITH DIFFERENTIAL/PLATELET
BASOS PCT: 0 %
Basophils Absolute: 0 10*3/uL (ref 0.0–0.1)
EOS ABS: 0.2 10*3/uL (ref 0.0–0.7)
EOS PCT: 2 %
HEMATOCRIT: 47.9 % (ref 39.0–52.0)
HEMOGLOBIN: 16 g/dL (ref 13.0–17.0)
LYMPHS PCT: 7 %
Lymphs Abs: 0.8 10*3/uL (ref 0.7–4.0)
MCH: 30.8 pg (ref 26.0–34.0)
MCHC: 33.4 g/dL (ref 30.0–36.0)
MCV: 92.3 fL (ref 78.0–100.0)
MONOS PCT: 7 %
Monocytes Absolute: 0.8 10*3/uL (ref 0.1–1.0)
NEUTROS PCT: 84 %
Neutro Abs: 9.9 10*3/uL — ABNORMAL HIGH (ref 1.7–7.7)
Platelets: 166 10*3/uL (ref 150–400)
RBC: 5.19 MIL/uL (ref 4.22–5.81)
RDW: 14.3 % (ref 11.5–15.5)
WBC: 11.7 10*3/uL — ABNORMAL HIGH (ref 4.0–10.5)

## 2016-01-24 LAB — URINE MICROSCOPIC-ADD ON

## 2016-01-24 LAB — URINALYSIS, ROUTINE W REFLEX MICROSCOPIC
BILIRUBIN URINE: NEGATIVE
GLUCOSE, UA: NEGATIVE mg/dL
Ketones, ur: NEGATIVE mg/dL
NITRITE: NEGATIVE
Protein, ur: 100 mg/dL — AB
SPECIFIC GRAVITY, URINE: 1.02 (ref 1.005–1.030)
pH: 6 (ref 5.0–8.0)

## 2016-01-24 LAB — I-STAT CG4 LACTIC ACID, ED
LACTIC ACID, VENOUS: 3.22 mmol/L — AB (ref 0.5–1.9)
Lactic Acid, Venous: 2.42 mmol/L (ref 0.5–1.9)

## 2016-01-24 LAB — GLUCOSE, CAPILLARY: Glucose-Capillary: 193 mg/dL — ABNORMAL HIGH (ref 65–99)

## 2016-01-24 MED ORDER — CARVEDILOL 12.5 MG PO TABS
12.5000 mg | ORAL_TABLET | Freq: Two times a day (BID) | ORAL | Status: DC
  Administered 2016-01-25 – 2016-01-27 (×5): 12.5 mg via ORAL
  Filled 2016-01-24 (×5): qty 1

## 2016-01-24 MED ORDER — SODIUM CHLORIDE 0.9 % IV BOLUS (SEPSIS)
1000.0000 mL | Freq: Once | INTRAVENOUS | Status: AC
  Administered 2016-01-24: 1000 mL via INTRAVENOUS

## 2016-01-24 MED ORDER — LOSARTAN POTASSIUM-HCTZ 100-25 MG PO TABS: 1.0000 | ORAL_TABLET | Freq: Every day | ORAL | Status: DC

## 2016-01-24 MED ORDER — ALBUTEROL SULFATE (2.5 MG/3ML) 0.083% IN NEBU: 2.5000 mg | INHALATION_SOLUTION | Freq: Four times a day (QID) | RESPIRATORY_TRACT | Status: DC

## 2016-01-24 MED ORDER — ONDANSETRON HCL 4 MG/2ML IJ SOLN: 4.0000 mg | Freq: Four times a day (QID) | INTRAMUSCULAR | Status: DC | PRN

## 2016-01-24 MED ORDER — ASPIRIN 81 MG PO CHEW
81.0000 mg | CHEWABLE_TABLET | Freq: Every day | ORAL | Status: DC
  Administered 2016-01-25 – 2016-01-27 (×3): 81 mg via ORAL
  Filled 2016-01-24 (×3): qty 1

## 2016-01-24 MED ORDER — ENOXAPARIN SODIUM 40 MG/0.4ML ~~LOC~~ SOLN
40.0000 mg | SUBCUTANEOUS | Status: DC
  Administered 2016-01-24 – 2016-01-26 (×3): 40 mg via SUBCUTANEOUS
  Filled 2016-01-24 (×3): qty 0.4

## 2016-01-24 MED ORDER — ALBUTEROL SULFATE (2.5 MG/3ML) 0.083% IN NEBU: 3.0000 mL | INHALATION_SOLUTION | Freq: Four times a day (QID) | RESPIRATORY_TRACT | Status: DC | PRN

## 2016-01-24 MED ORDER — DORZOLAMIDE HCL 2 % OP SOLN
1.0000 [drp] | Freq: Two times a day (BID) | OPHTHALMIC | Status: DC
  Administered 2016-01-25 – 2016-01-27 (×5): 1 [drp] via OPHTHALMIC
  Filled 2016-01-24: qty 10

## 2016-01-24 MED ORDER — SODIUM CHLORIDE 0.9 % IV SOLN
INTRAVENOUS | Status: AC
  Administered 2016-01-24: 1000 mL via INTRAVENOUS

## 2016-01-24 MED ORDER — LOSARTAN POTASSIUM 50 MG PO TABS
100.0000 mg | ORAL_TABLET | Freq: Every day | ORAL | Status: DC
  Administered 2016-01-25 – 2016-01-27 (×3): 100 mg via ORAL
  Filled 2016-01-24 (×3): qty 2

## 2016-01-24 MED ORDER — ACETAMINOPHEN 500 MG PO TABS
1000.0000 mg | ORAL_TABLET | Freq: Once | ORAL | Status: AC
  Administered 2016-01-24: 1000 mg via ORAL
  Filled 2016-01-24: qty 2

## 2016-01-24 MED ORDER — CEFTRIAXONE SODIUM 1 G IJ SOLR
1.0000 g | INTRAMUSCULAR | Status: DC
  Administered 2016-01-24: 1 g via INTRAVENOUS
  Filled 2016-01-24: qty 10

## 2016-01-24 MED ORDER — LATANOPROST 0.005 % OP SOLN
1.0000 [drp] | Freq: Every day | OPHTHALMIC | Status: DC
  Administered 2016-01-26 (×2): 1 [drp] via OPHTHALMIC
  Filled 2016-01-24: qty 2.5

## 2016-01-24 MED ORDER — HYDROCHLOROTHIAZIDE 25 MG PO TABS
25.0000 mg | ORAL_TABLET | Freq: Every day | ORAL | Status: DC
  Administered 2016-01-25 – 2016-01-27 (×3): 25 mg via ORAL
  Filled 2016-01-24 (×3): qty 1

## 2016-01-24 MED ORDER — PANTOPRAZOLE SODIUM 40 MG PO TBEC
40.0000 mg | DELAYED_RELEASE_TABLET | Freq: Every day | ORAL | Status: DC
  Administered 2016-01-25 – 2016-01-27 (×3): 40 mg via ORAL
  Filled 2016-01-24 (×3): qty 1

## 2016-01-24 MED ORDER — BETAXOLOL HCL 0.5 % OP SOLN
1.0000 [drp] | Freq: Two times a day (BID) | OPHTHALMIC | Status: DC
  Administered 2016-01-24 – 2016-01-27 (×5): 1 [drp] via OPHTHALMIC
  Filled 2016-01-24: qty 5

## 2016-01-24 MED ORDER — PILOCARPINE HCL 2 % OP SOLN
1.0000 [drp] | Freq: Four times a day (QID) | OPHTHALMIC | Status: DC
  Administered 2016-01-25 – 2016-01-27 (×9): 1 [drp] via OPHTHALMIC
  Filled 2016-01-24: qty 15

## 2016-01-24 MED ORDER — ONDANSETRON HCL 4 MG PO TABS: 4.0000 mg | ORAL_TABLET | Freq: Four times a day (QID) | ORAL | Status: DC | PRN

## 2016-01-24 MED ORDER — SODIUM CHLORIDE 0.9% FLUSH
3.0000 mL | Freq: Two times a day (BID) | INTRAVENOUS | Status: DC
  Administered 2016-01-24 – 2016-01-27 (×6): 3 mL via INTRAVENOUS

## 2016-01-24 MED ORDER — ONDANSETRON HCL 4 MG/2ML IJ SOLN: 4.0000 mg | Freq: Three times a day (TID) | INTRAMUSCULAR | Status: DC | PRN

## 2016-01-24 MED ORDER — DEXTROSE 5 % IV SOLN: 1.0000 g | Freq: Once | INTRAVENOUS | Status: DC

## 2016-01-24 MED ORDER — AZITHROMYCIN 500 MG IV SOLR: 500.0000 mg | Freq: Once | INTRAVENOUS | Status: DC

## 2016-01-24 MED ORDER — DEXTROSE 5 % IV SOLN
1.0000 g | INTRAVENOUS | Status: DC
  Filled 2016-01-24: qty 10

## 2016-01-24 MED ORDER — DEXTROSE 5 % IV SOLN
500.0000 mg | INTRAVENOUS | Status: DC
  Administered 2016-01-24: 500 mg via INTRAVENOUS
  Filled 2016-01-24: qty 500

## 2016-01-24 MED ORDER — INSULIN ASPART 100 UNIT/ML ~~LOC~~ SOLN
0.0000 [IU] | Freq: Three times a day (TID) | SUBCUTANEOUS | Status: DC
  Administered 2016-01-25 (×2): 1 [IU] via SUBCUTANEOUS
  Administered 2016-01-26: 2 [IU] via SUBCUTANEOUS
  Administered 2016-01-26: 1 [IU] via SUBCUTANEOUS
  Administered 2016-01-26: 2 [IU] via SUBCUTANEOUS
  Administered 2016-01-27: 1 [IU] via SUBCUTANEOUS
  Administered 2016-01-27: 3 [IU] via SUBCUTANEOUS

## 2016-01-24 NOTE — H&P (Signed)
TRH H&P   Patient Demographics:    Anthony Murray, is a 82 y.o. male  MRN: [id number]  DOB - 08-29-1927  Admit Date - 01/24/2016  Outpatient Primary MD for the patient is Wende Neighbors, MD  Referring MD/NP/PA: Dr Sabra Heck  Patient coming from: home   Chief Complaint  Patient presents with  . Fall      HPI:    Anthony Murray  is a 82 y.o. male, With history of CAD, CVA, diabetes mellitus, hypertension who was brought to the hospital after patient fell onto the commode and broke it. Paramedics found the patient he complained of small amount of low back pain. Also complaining of shortness of breath. As per daughter patient has been feeding weak and wobbly on his feet since this morning, also noted to have fever 102.9. He has been complaining of intermittent dysuria. He denies passing out. He says, that he fell as he lost his balance. In the ED imaging studies were negative for any acute fracture. Patient found to be febrile with temp 102.9. Chest x-ray was clear, UA showed mild abnormality. Started on ceftriaxone for UTI.    Review of systems:    In addition to the HPI above,  No Headache, No changes with Vision or hearing, No problems swallowing food or Liquids, No Chest pain No Abdominal pain, No Nausea or Vomiting, bowel movements are regular, No Blood in stool or Urine, No dysuria, No new skin rashes or bruises, No new joints pains-aches,  No new weakness, tingling, numbness in any extremity, No recent weight gain or loss, No polyuria, polydypsia or polyphagia, No significant Mental Stressors.  A full 10 point Review of Systems was done, except as stated above, all other Review of Systems were negative.   With Past History of the following :    Past Medical History:  Diagnosis Date  . ASCVD (arteriosclerotic cardiovascular disease)    50% ostial left left anterior desending 60% mid circumflex  and normal ejection fractioon in 2006 exertional dyspnea;history of chest discomfort  . Cerebrovascular disease    minimal carotid bruits  . Diabetes mellitus type 2, controlled ([place])    no insulin  . Hyperlipidemia    statin intolerant  . Hypertension   . Tobacco abuse, in remission    30 pack years stopped in 1971      Past Surgical History:  Procedure Laterality Date  . APPENDECTOMY    . CARPAL TUNNEL RELEASE     surgery left 05/05/2001  . KNEE ARTHROSCOPY     bilaterial; unknown associated surgical procedure  . SHOULDER SURGERY     Right x2; third procedure on left  . TONSILLECTOMY    . TRANSURETHRAL INCISION OF PROSTATE     resection of the prostate      Social History:     Social History  Substance Use Topics  . Smoking status: Former Smoker    Packs/day: 1.00    Years: 40.00    Types: Cigarettes    Quit date:  10/29/1969  . Smokeless tobacco: Never Used  . Alcohol use No     Lives - At home with his wife, with daughter and grandson living in the backyard  Mobility - he is able to walk around with a cane and sometimes uses walker     Family History :   Patient's mother died at age of 71 due to liver cirrhosis   Home Medications:   Prior to Admission medications   Medication Sig Start Date End Date Taking? Authorizing Provider  albuterol (PROVENTIL HFA;VENTOLIN HFA) 108 (90 Base) MCG/ACT inhaler Inhale 1 puff into the lungs every 6 (six) hours as needed for wheezing or shortness of breath.   Yes Historical Provider, MD  aspirin 81 MG tablet Take 81 mg by mouth daily.   Yes Historical Provider, MD  betaxolol (BETOPTIC-S) 0.5 % ophthalmic suspension Place 1 drop into both eyes 2 (two) times daily.  12/15/15  Yes Historical Provider, MD  carvedilol (COREG) 12.5 MG tablet Take 12.5 mg by mouth 2 (two) times daily with a meal.  09/28/12  Yes Historical Provider, MD  dorzolamide (TRUSOPT) 2 % ophthalmic solution Place 1 drop into the left eye 2 (two) times daily.      Yes Historical Provider, MD  glipiZIDE (GLUCOTROL XL) 2.5 MG 24 hr tablet Take 5 mg by mouth daily with breakfast.    Yes Historical Provider, MD  latanoprost (XALATAN) 0.005 % ophthalmic solution Place 1 drop into both eyes at bedtime.     Yes Historical Provider, MD  losartan-hydrochlorothiazide (HYZAAR) 100-25 MG tablet Take 1 tablet by mouth daily. 05/21/15  Yes [name], MD  omeprazole (PRILOSEC) 20 MG capsule Take 20 mg by mouth daily.     Yes Historical Provider, MD  pilocarpine (PILOCAR) 2 % ophthalmic solution Place 1 drop into the left eye 4 (four) times daily.    Yes Historical Provider, MD  tiotropium (SPIRIVA HANDIHALER) 18 MCG inhalation capsule Place 1 capsule (18 mcg total) into inhaler and inhale daily. 02/14/15  Yes [name], MD  vitamin C (ASCORBIC ACID) 500 MG tablet Take 500 mg by mouth daily.   Yes Historical Provider, MD  vitamin E 400 UNIT capsule Take 400 Units by mouth daily.   Yes Historical Provider, MD     Allergies:     Allergies  Allergen Reactions  . Codeine   . Ezetimibe-Simvastatin     REACTION: myalgias  . Naproxen     REACTION: and all nonsteroidals  . Niacin   . Pravastatin Sodium     REACTION: myalgias no     Physical Exam:   Vitals  Blood pressure 136/68, pulse 102, temperature 102.9 F (39.4 C), temperature source Oral, resp. rate 25, weight 88.5 kg (195 lb), SpO2 91 %.   1. General *Caucasian male* lying in bed in NAD, cooperative* with exam  2. Normal affect and insight, Awake Alert, Oriented X 3.  3. No F.N deficits, ALL C.Nerves Intact, Strength 5/5 all 4 extremities, Sensation intact all 4 extremities, Plantars down going.  4. Ears and Eyes appear Normal, Conjunctivae clear, PERRLA. Moist Oral Mucosa.  5. Supple Neck, No JVD, No cervical lymphadenopathy appriciated, No Carotid Bruits.  6. Symmetrical Chest wall movement, Good air movement bilaterally, CTAB.  7. RRR, No Gallops, Rubs or Murmurs, No  Parasternal Heave.No Leg edema  8. Positive Bowel Sounds, Abdomen Soft, No tenderness, No organomegaly appriciated,No rebound -guarding or rigidity.  9.  No Cyanosis, Normal Skin Turgor, No Skin Rash  or Bruise.  10. Good muscle tone,  joints appear normal , no effusions, Normal ROM.      Data Review:    CBC  Recent Labs Lab 01/24/16 1443  WBC 11.7*  HGB 16.0  HCT 47.9  PLT 166  MCV 92.3  MCH 30.8  MCHC 33.4  RDW 14.3  LYMPHSABS 0.8  MONOABS 0.8  EOSABS 0.2  BASOSABS 0.0   ------------------------------------------------------------------------------------------------------------------  Chemistries   Recent Labs Lab 01/24/16 1443  NA 139  K 3.9  CL 103  CO2 23  GLUCOSE 140*  BUN 20  CREATININE 1.46*  CALCIUM 9.6  AST 29  ALT 39  ALKPHOS 55  BILITOT 1.0   ------------------------------------------------------------------------------------------------------------------  ------------------------------------------------------------------------------------------------------------------ GFR: Estimated Creatinine Clearance: 38.2 mL/min (by C-G formula based on SCr of 1.46 mg/dL). Liver Function Tests:  Recent Labs Lab 01/24/16 1443  AST 29  ALT 39  ALKPHOS 55  BILITOT 1.0  PROT 7.4  ALBUMIN 4.3    --------------------------------------------------------------------------------------------------------------- Urine analysis:    Component Value Date/Time   COLORURINE YELLOW 01/24/2016 1505   APPEARANCEUR CLEAR 01/24/2016 1505   LABSPEC 1.020 01/24/2016 1505   PHURINE 6.0 01/24/2016 1505   GLUCOSEU NEGATIVE 01/24/2016 1505   HGBUR LARGE (A) 01/24/2016 1505   BILIRUBINUR NEGATIVE 01/24/2016 1505   [place] 01/24/2016 1505   PROTEINUR 100 (A) 01/24/2016 1505   NITRITE NEGATIVE 01/24/2016 1505   LEUKOCYTESUR MODERATE (A) 01/24/2016 1505       ----------------------------------------------------------------------------------------------------------------   Imaging Results:    Dg Lumbar Spine Complete  Result Date: 01/24/2016 CLINICAL DATA:  82 year old male with history of trauma from a fall while getting off of the toilet. Lower back pain. EXAM: LUMBAR SPINE - COMPLETE 4+ VIEW COMPARISON:  No priors.  CT the abdomen and pelvis 08/26/2006. FINDINGS: Five views of the lumbar spine demonstrate no definite acute displaced fracture or compression type fracture. There is a 8 mm of anterolisthesis of L4 upon L5, which appears only slightly increased compared to prior CT scan 08/26/2006. Alignment is otherwise anatomic. Multilevel degenerative disc disease, most severe at L5-S1. Multilevel facet arthropathy, also most severe at L5-S1. No defects of the pars interarticularis. IMPRESSION: 1. No definite acute radiographic abnormality of the lumbar spine. 2. Multilevel degenerative disc disease and lumbar spondylosis redemonstrated, with grade 1 anterolisthesis of L4 upon L5. Electronically Signed   By: Vinnie Langton M.D.   On: 01/24/2016 16:36   Dg Pelvis 1-2 Views  Result Date: 01/24/2016 CLINICAL DATA:  Fall, low back pain. EXAM: PELVIS - 1-2 VIEW COMPARISON:  None. FINDINGS: Characterization is slightly limited by patient obliquity. There is no evidence of pelvic fracture or diastasis. No pelvic bone lesions are seen. IMPRESSION: Negative. Electronically Signed   By: Franki Cabot M.D.   On: 01/24/2016 16:34   Dg Chest Port 1 View  Result Date: 01/24/2016 CLINICAL DATA:  CODE SEPSIS, SOB, FEVER, Fall at home getting on toilet. C/o lower back pain. Wife says temp at home was 100.0. C/o weakness. Pt broke the toilet. HISTORY OF DM, HTN, ASCVD, TOBACCO ABUSE EXAM: PORTABLE CHEST 1 VIEW COMPARISON:  10/19/2012 FINDINGS: Normal cardiac silhouette. Low lung volumes. No pulmonary edema, pneumothorax or infiltrate. Mild bronchitic markings. Exam  is lordotic. IMPRESSION: No acute cardiopulmonary process. Electronically Signed   By: Suzy Bouchard M.D.   On: 01/24/2016 15:38    My personal review of EKG: Rhythm NSR   Assessment & Plan:    Active Problems:   Essential hypertension   Diabetes mellitus  type 2, controlled ([place])   Chronic kidney disease   UTI (lower urinary tract infection)   1. Fall- likely due to UTI, will need PT  evaluation once more stable. No acute injury identified on the imaging studies. We'll check orthostatic vital signs every 12 hours. 2. UTI- started ceftriaxone, follow urine culture results. Lactic acid 3.02, will obtain lactic acid every 3 hours 3. Diabetes mellitus- old WITH acidic agents, start sliding scale insulin with NovoLog. 4. Chronic kidney stage III- patient's creatinine 21.46,  at baseline 5. Hypertension- blood pressure stable, continue Hyzaar, carvedilol.   DVT Prophylaxis-   Lovenox   AM Labs Ordered, also please review Full Orders  Family Communication: Discussed with patient his grandson and daughter at bedside  Code Status:  Full code  Admission status: Observation  Time spent in minutes : 60 min   Brianca Fortenberry S M.D on 01/24/2016 at 5:59 PM  Between 7am to 7pm - Pager - [phone]. After 7pm go to [email or web address] - password [facility]  [facility] - Office  [phone]

## 2016-01-24 NOTE — ED Triage Notes (Signed)
Fall at home getting on toilet.  C/o lower back pain.  Wife says temp at home was 100.0.  C/o weakness.  Pt broke the toilet.

## 2016-01-24 NOTE — ED Notes (Signed)
Daughter Stacie [place] [phone].

## 2016-01-24 NOTE — Progress Notes (Signed)
Called ED RN to get report on the Pt, RN is unavailable at this time. Will call back soon.

## 2016-01-24 NOTE — ED Notes (Signed)
Pt states he feels better. Advised we are waiting on beds situation to be solved upstairs. No needs voiced.

## 2016-01-24 NOTE — Progress Notes (Addendum)
Pharmacy Antibiotic Note  Anthony Murray is a 82 y.o. male admitted on 01/24/2016 with UTI.  Pharmacy has been consulted for  Ceftriaxone dosing. Note protocol changed on admission to UTI  Plan: Discontinue Azithromycin 500 mg IV every 24 hours Continue Ceftriaxone 1 GM IV every 24 hours F/U LOT, oral therapy when appropriate Labs per protocol     Temp (24hrs), Avg:102.9 F (39.4 C), Min:102.9 F (39.4 C), Max:102.9 F (39.4 C)  No results for input(s): WBC, CREATININE, LATICACIDVEN, VANCOTROUGH, VANCOPEAK, VANCORANDOM, GENTTROUGH, GENTPEAK, GENTRANDOM, TOBRATROUGH, TOBRAPEAK, TOBRARND, AMIKACINPEAK, AMIKACINTROU, AMIKACIN in the last 168 hours.  CrCl cannot be calculated (Unknown ideal weight.).    Allergies  Allergen Reactions  . Codeine   . Ezetimibe-Simvastatin     REACTION: myalgias  . Naproxen     REACTION: and all nonsteroidals  . Niacin   . Pravastatin Sodium     REACTION: myalgias no    Antimicrobials this admission: Azithromycin  8/5 >> 8/5 Ceftriaxone 8/5 >>    Dose adjustments this admission: None    Thank you for allowing pharmacy to be a part of this patient's care.  Chriss Czar 01/24/2016 2:42 PM

## 2016-01-24 NOTE — ED Provider Notes (Signed)
[facility] DEPT Provider Note   CSN: [id number] Arrival date & time: 01/24/16  1422  First Provider Contact:  None       History   Chief Complaint Chief Complaint  Patient presents with  . Fall    HPI Anthony Murray is a 82 y.o. male.  The patient is an 82 year old male, he presents by EMS febrile, tachycardic after becoming extremely weak throughout the day today. He had a fall while he was trying to use the commode, he fell onto the commode and broke it. The paramedics found the patient on the floor. He had water around his pants, he is complaining of a small amount of low back pain. The patient states that he feels short of breath but he is not having any coughing. He is unsure if he is febrile. He has had some food to eat today and has not had any vomiting or diarrhea. The symptoms are persistent, gradually worsening and are now severe. The patient does have a heart history, he has a normal ejection fraction as of an 2006 study, he has cerebrovascular disease, diabetes but is controlled without insulin, hypertension and hyperlipidemia. He is also noted to have aortic stenosis and a prior smoker though he has not smoked in over 40 years.      Past Medical History:  Diagnosis Date  . ASCVD (arteriosclerotic cardiovascular disease)    50% ostial left left anterior desending 60% mid circumflex and normal ejection fractioon in 2006 exertional dyspnea;history of chest discomfort  . Cerebrovascular disease    minimal carotid bruits  . Diabetes mellitus type 2, controlled ([place])    no insulin  . Hyperlipidemia    statin intolerant  . Hypertension   . Tobacco abuse, in remission    30 pack years stopped in 1971    Patient Active Problem List   Diagnosis Date Noted  . Aortic stenosis 10/19/2012  . Chronic kidney disease 10/10/2011  . Peripheral vascular disease ([facility]) 10/10/2011  . Tobacco abuse, in remission   . Diabetes mellitus type 2, controlled ([place])   .  Arteriosclerotic cardiovascular disease (ASCVD) 08/28/2009  . CEREBROVASCULAR DISEASE 08/28/2009  . HYPERLIPIDEMIA 12/09/2008  . HYPERTENSION 12/09/2008    Past Surgical History:  Procedure Laterality Date  . APPENDECTOMY    . CARPAL TUNNEL RELEASE     surgery left 05/05/2001  . KNEE ARTHROSCOPY     bilaterial; unknown associated surgical procedure  . SHOULDER SURGERY     Right x2; third procedure on left  . TONSILLECTOMY    . TRANSURETHRAL INCISION OF PROSTATE     resection of the prostate       Home Medications    Prior to Admission medications   Medication Sig Start Date End Date Taking? Authorizing Provider  albuterol (PROVENTIL HFA;VENTOLIN HFA) 108 (90 Base) MCG/ACT inhaler Inhale 1 puff into the lungs every 6 (six) hours as needed for wheezing or shortness of breath.    Historical Provider, MD  aspirin 81 MG tablet Take 81 mg by mouth daily.    Historical Provider, MD  Brimonidine Tartrate-Timolol (COMBIGAN OP) Apply 1 drop to eye 2 (two) times daily. 1 drop both eyes bid      Historical Provider, MD  carvedilol (COREG) 12.5 MG tablet Take 12.5 mg by mouth 2 (two) times daily with a meal.  09/28/12   Historical Provider, MD  dorzolamide (TRUSOPT) 2 % ophthalmic solution Place 1 drop into the left eye 2 (two) times daily.  Historical Provider, MD  glipiZIDE (GLUCOTROL XL) 2.5 MG 24 hr tablet Take 5 mg by mouth daily with breakfast.     Historical Provider, MD  latanoprost (XALATAN) 0.005 % ophthalmic solution Place 1 drop into both eyes at bedtime.      Historical Provider, MD  losartan-hydrochlorothiazide (HYZAAR) 100-25 MG tablet Take 1 tablet by mouth daily. 05/21/15   [name], MD  omeprazole (PRILOSEC) 20 MG capsule Take 20 mg by mouth daily.      Historical Provider, MD  tiotropium (SPIRIVA HANDIHALER) 18 MCG inhalation capsule Place 1 capsule (18 mcg total) into inhaler and inhale daily. 02/14/15   [name], MD  vitamin C (ASCORBIC ACID) 500 MG  tablet Take 500 mg by mouth daily.    Historical Provider, MD  vitamin E 400 UNIT capsule Take 400 Units by mouth daily.    Historical Provider, MD    Family History History reviewed. No pertinent family history.  Social History Social History  Substance Use Topics  . Smoking status: Former Smoker    Packs/day: 1.00    Years: 40.00    Types: Cigarettes    Quit date: 10/29/1969  . Smokeless tobacco: Never Used  . Alcohol use No     Allergies   Codeine; Ezetimibe-simvastatin; Naproxen; Niacin; and Pravastatin sodium   Review of Systems Review of Systems  All other systems reviewed and are negative.    Physical Exam Updated Vital Signs BP 138/89   Pulse 108   Temp 102.9 F (39.4 C) (Oral)   Resp (!) 30   Wt 195 lb (88.5 kg)   SpO2 95%   BMI 31.00 kg/m   Physical Exam  Constitutional: He appears well-developed and well-nourished. He appears distressed.  HENT:  Head: Normocephalic and atraumatic.  Mouth/Throat: No oropharyngeal exudate.  Dry mucous membranes, tachypneic  Eyes: Conjunctivae and EOM are normal. Pupils are equal, round, and reactive to light. Right eye exhibits no discharge. Left eye exhibits no discharge. No scleral icterus.  Neck: Normal range of motion. Neck supple. No JVD present. No thyromegaly present.  Cardiovascular: Regular rhythm and intact distal pulses.  Exam reveals no gallop and no friction rub.   Murmur heard. Tachycardic to 130 bpm, strong pulses at the radial arteries, no JVD, no peripheral edema  Pulmonary/Chest: He is in respiratory distress. He has wheezes. He has rales.  Very tachypneic, sats of 94% on room air, using accessory muscles, slight rales, slight wheezing, nonfocal  Abdominal: Soft. Bowel sounds are normal. He exhibits no distension and no mass. There is no tenderness.  Musculoskeletal: Normal range of motion. He exhibits no edema or tenderness.  Lymphadenopathy:    He has no cervical adenopathy.  Neurological: He is  alert. Coordination normal.  Diffuse generalized weakness, the patient is able to stand with assistance but this is significant amount of assistance, no focal weakness, cranial nerves III through XII are normal, mentation and speech are normal.  Skin: Skin is warm and dry. No rash noted. No erythema.  Psychiatric: He has a normal mood and affect. His behavior is normal.  Nursing note and vitals reviewed.    ED Treatments / Results  Labs (all labs ordered are listed, but only abnormal results are displayed) Labs Reviewed  COMPREHENSIVE METABOLIC PANEL - Abnormal; Notable for the following:       Result Value   Glucose, Bld 140 (*)    Creatinine, Ser 1.46 (*)    GFR calc non Af Amer 42 (*)  GFR calc Af Amer 48 (*)    All other components within normal limits  CBC WITH DIFFERENTIAL/PLATELET - Abnormal; Notable for the following:    WBC 11.7 (*)    Neutro Abs 9.9 (*)    All other components within normal limits  URINALYSIS, ROUTINE W REFLEX MICROSCOPIC (NOT AT [facility]) - Abnormal; Notable for the following:    Hgb urine dipstick LARGE (*)    Protein, ur 100 (*)    Leukocytes, UA MODERATE (*)    All other components within normal limits  URINE MICROSCOPIC-ADD ON - Abnormal; Notable for the following:    Squamous Epithelial / LPF 0-5 (*)    Bacteria, UA MANY (*)    All other components within normal limits  I-STAT CG4 LACTIC ACID, ED - Abnormal; Notable for the following:    Lactic Acid, Venous 3.22 (*)    All other components within normal limits  CULTURE, BLOOD (ROUTINE X 2)  CULTURE, BLOOD (ROUTINE X 2)  URINE CULTURE    EKG  EKG Interpretation  Date/Time:  Saturday January 24 2016 14:34:52 EDT Ventricular Rate:  124 PR Interval:    QRS Duration: 91 QT Interval:  321 QTC Calculation: 461 R Axis:   -47 Text Interpretation:  Sinus tachycardia Inferior infarct, old Since last tracing rate faster Abnormal ekg Confirmed by Sabra Heck  MD, Morrell Fluke ([id number]) on 01/24/2016 2:40:49 PM         Radiology Dg Chest Port 1 View  Result Date: 01/24/2016 CLINICAL DATA:  CODE SEPSIS, SOB, FEVER, Fall at home getting on toilet. C/o lower back pain. Wife says temp at home was 100.0. C/o weakness. Pt broke the toilet. HISTORY OF DM, HTN, ASCVD, TOBACCO ABUSE EXAM: PORTABLE CHEST 1 VIEW COMPARISON:  10/19/2012 FINDINGS: Normal cardiac silhouette. Low lung volumes. No pulmonary edema, pneumothorax or infiltrate. Mild bronchitic markings. Exam is lordotic. IMPRESSION: No acute cardiopulmonary process. Electronically Signed   By: Suzy Bouchard M.D.   On: 01/24/2016 15:38    Procedures Procedures (including critical care time)  Medications Ordered in ED Medications  sodium chloride 0.9 % bolus 1,000 mL (0 mLs Intravenous Stopped 01/24/16 1600)    And  sodium chloride 0.9 % bolus 1,000 mL (1,000 mLs Intravenous New Bag/Given 01/24/16 1608)    And  sodium chloride 0.9 % bolus 1,000 mL (not administered)  azithromycin (ZITHROMAX) 500 mg in dextrose 5 % 250 mL IVPB (0 mg Intravenous Stopped 01/24/16 1609)  cefTRIAXone (ROCEPHIN) 1 g in dextrose 5 % 50 mL IVPB (0 g Intravenous Stopped 01/24/16 1530)  acetaminophen (TYLENOL) tablet 1,000 mg (1,000 mg Oral Given 01/24/16 1453)     Initial Impression / Assessment and Plan / ED Course  I have reviewed the triage vital signs and the nursing notes.  Pertinent labs & imaging results that were available during my care of the patient were reviewed by me and considered in my medical decision making (see chart for details).  Clinical Course  Comment By Time  The patient has a temperature of 102.9, he is very tachycardic and tachypneic. He will need Tylenol, 30 mL/kg of IV fluids as I suspect that the patient is septic. Given his tachypnea I suspect this is a pulmonary source however urinalysis and blood cultures will be ordered as well. There does not appear to be any other source of infection including the skin or the upper respiratory tract Noemi Chapel, MD  08/05 1440  Chest x-ray reviewed, no findings of infiltrate, labs show a slight leukocytosis,  he persisted be tachycardic and febrile, urinalysis has been obtained but has not yet resulted. He does have some renal insufficiency with a creatinine of 1.46. Noemi Chapel, MD 08/05 1557  Repeat sepsis assessment completed.  Will d/w hospitalist - needs admission Noemi Chapel, MD 08/05 1612  D/w Dr. Darrick Meigs - he will admit to hospital. Abx given Pt heart rate has improved slightly Critically  ill Noemi Chapel, MD 08/05 1622   CRITICAL CARE Performed by: Johnna Acosta Total critical care time: 35 minutes Critical care time was exclusive of separately billable procedures and treating other patients. Critical care was necessary to treat or prevent imminent or life-threatening deterioration. Critical care was time spent personally by me on the following activities: development of treatment plan with patient and/or surrogate as well as nursing, discussions with consultants, evaluation of patient's response to treatment, examination of patient, obtaining history from patient or surrogate, ordering and performing treatments and interventions, ordering and review of laboratory studies, ordering and review of radiographic studies, pulse oximetry and re-evaluation of patient's condition.   Final Clinical Impressions(s) / ED Diagnoses   Final diagnoses:  Sepsis, due to unspecified organism [facility])    New Prescriptions New Prescriptions   No medications on file     Noemi Chapel, MD 01/24/16 1623

## 2016-01-24 NOTE — ED Notes (Signed)
CRITICAL VALUE ALERT  Critical value received:  Lactic 2.42  Date of notification:  01/24/16  Time of notification:  0712  Critical value read back:Yes.    Nurse who received alert:  Derek Mound, RN  MD notified (1st page):  [place]  Time of first page:  0712  MD notified (2nd page):  Time of second page:  Responding MD:  Darrick Meigs  Time MD responded:  [phone]

## 2016-01-25 DIAGNOSIS — I2510 Atherosclerotic heart disease of native coronary artery without angina pectoris: Secondary | ICD-10-CM | POA: Diagnosis not present

## 2016-01-25 DIAGNOSIS — B9620 Unspecified Escherichia coli [E. coli] as the cause of diseases classified elsewhere: Secondary | ICD-10-CM | POA: Diagnosis not present

## 2016-01-25 DIAGNOSIS — Z7984 Long term (current) use of oral hypoglycemic drugs: Secondary | ICD-10-CM | POA: Diagnosis not present

## 2016-01-25 DIAGNOSIS — I129 Hypertensive chronic kidney disease with stage 1 through stage 4 chronic kidney disease, or unspecified chronic kidney disease: Secondary | ICD-10-CM | POA: Diagnosis not present

## 2016-01-25 DIAGNOSIS — R509 Fever, unspecified: Secondary | ICD-10-CM | POA: Diagnosis not present

## 2016-01-25 DIAGNOSIS — Z8673 Personal history of transient ischemic attack (TIA), and cerebral infarction without residual deficits: Secondary | ICD-10-CM | POA: Diagnosis not present

## 2016-01-25 DIAGNOSIS — E119 Type 2 diabetes mellitus without complications: Secondary | ICD-10-CM

## 2016-01-25 DIAGNOSIS — E872 Acidosis: Secondary | ICD-10-CM | POA: Diagnosis not present

## 2016-01-25 DIAGNOSIS — M545 Low back pain: Secondary | ICD-10-CM | POA: Diagnosis not present

## 2016-01-25 DIAGNOSIS — E1122 Type 2 diabetes mellitus with diabetic chronic kidney disease: Secondary | ICD-10-CM | POA: Diagnosis not present

## 2016-01-25 DIAGNOSIS — Z87891 Personal history of nicotine dependence: Secondary | ICD-10-CM | POA: Diagnosis not present

## 2016-01-25 DIAGNOSIS — S3992XA Unspecified injury of lower back, initial encounter: Secondary | ICD-10-CM | POA: Diagnosis not present

## 2016-01-25 DIAGNOSIS — A419 Sepsis, unspecified organism: Principal | ICD-10-CM

## 2016-01-25 DIAGNOSIS — R531 Weakness: Secondary | ICD-10-CM | POA: Diagnosis not present

## 2016-01-25 DIAGNOSIS — E785 Hyperlipidemia, unspecified: Secondary | ICD-10-CM | POA: Diagnosis not present

## 2016-01-25 DIAGNOSIS — N183 Chronic kidney disease, stage 3 (moderate): Secondary | ICD-10-CM

## 2016-01-25 DIAGNOSIS — R0682 Tachypnea, not elsewhere classified: Secondary | ICD-10-CM | POA: Diagnosis not present

## 2016-01-25 DIAGNOSIS — I10 Essential (primary) hypertension: Secondary | ICD-10-CM | POA: Diagnosis not present

## 2016-01-25 DIAGNOSIS — Z7951 Long term (current) use of inhaled steroids: Secondary | ICD-10-CM | POA: Diagnosis not present

## 2016-01-25 DIAGNOSIS — N390 Urinary tract infection, site not specified: Secondary | ICD-10-CM

## 2016-01-25 DIAGNOSIS — Z7982 Long term (current) use of aspirin: Secondary | ICD-10-CM | POA: Diagnosis not present

## 2016-01-25 DIAGNOSIS — M5489 Other dorsalgia: Secondary | ICD-10-CM | POA: Diagnosis not present

## 2016-01-25 DIAGNOSIS — E876 Hypokalemia: Secondary | ICD-10-CM | POA: Diagnosis not present

## 2016-01-25 LAB — GLUCOSE, CAPILLARY
GLUCOSE-CAPILLARY: 125 mg/dL — AB (ref 65–99)
GLUCOSE-CAPILLARY: 95 mg/dL (ref 65–99)
GLUCOSE-CAPILLARY: 130 mg/dL — AB (ref 65–99)
Glucose-Capillary: 126 mg/dL — ABNORMAL HIGH (ref 65–99)
Glucose-Capillary: 112 mg/dL — ABNORMAL HIGH (ref 65–99)

## 2016-01-25 LAB — COMPREHENSIVE METABOLIC PANEL
ALK PHOS: 37 U/L — AB (ref 38–126)
ALT: 29 U/L (ref 17–63)
ANION GAP: 9 (ref 5–15)
AST: 22 U/L (ref 15–41)
Albumin: 3.2 g/dL — ABNORMAL LOW (ref 3.5–5.0)
BILIRUBIN TOTAL: 1 mg/dL (ref 0.3–1.2)
BUN: 17 mg/dL (ref 6–20)
CALCIUM: 8 mg/dL — AB (ref 8.9–10.3)
CO2: 24 mmol/L (ref 22–32)
Chloride: 107 mmol/L (ref 101–111)
Creatinine, Ser: 1.43 mg/dL — ABNORMAL HIGH (ref 0.61–1.24)
GFR, EST AFRICAN AMERICAN: 50 mL/min — AB (ref >60)
GFR, EST NON AFRICAN AMERICAN: 43 mL/min — AB (ref >60)
Glucose, Bld: 99 mg/dL (ref 65–99)
Potassium: 3.3 mmol/L — ABNORMAL LOW (ref 3.5–5.1)
Sodium: 140 mmol/L (ref 135–145)
Total Protein: 5.7 g/dL — ABNORMAL LOW (ref 6.5–8.1)

## 2016-01-25 LAB — CBC
HEMATOCRIT: 40.8 % (ref 39.0–52.0)
HEMOGLOBIN: 13.5 g/dL (ref 13.0–17.0)
MCH: 30.8 pg (ref 26.0–34.0)
MCHC: 33.1 g/dL (ref 30.0–36.0)
MCV: 93.2 fL (ref 78.0–100.0)
Platelets: 148 10*3/uL — ABNORMAL LOW (ref 150–400)
RBC: 4.38 MIL/uL (ref 4.22–5.81)
RDW: 14.5 % (ref 11.5–15.5)
WBC: 12.5 10*3/uL — ABNORMAL HIGH (ref 4.0–10.5)

## 2016-01-25 MED ORDER — DEXTROSE 5 % IV SOLN
1.0000 g | INTRAVENOUS | Status: DC
  Administered 2016-01-25 – 2016-01-26 (×2): 1 g via INTRAVENOUS
  Filled 2016-01-25 (×3): qty 10

## 2016-01-25 NOTE — Progress Notes (Addendum)
PROGRESS NOTE  Anthony Murray [id number] DOB: [birth year] DOA: 01/24/2016 PCP: Wende Neighbors, MD  Brief Narrative: 82 year old man presented to the hospital after a fall at home. Complains included low back pain, shortness of breath, ataxia. Febrile 102.9, possible dysuria. Admitted for UTI.  Assessment/Plan: 1. Suspected sepsis secondary to UTI. Fever with lactic acidosis on admission. Chest x-ray no acute disease, skin appeared unremarkable. CMP unremarkable. WBC minimally elevated. Minimal thrombocytopenia. 2. UTI with fever. Urine culture and blood cultures pending 3. Mechanical fall with low back pain. Lumbar film and pelvis film negative. No apparent significant injury. 4. DM type 2. Continue SSI 5. CKD stage III. Currently at baseline  6. PMH ASCVD   Appears stable.  Continue IV antibiotics, follow-up culture data.  Discontinue telemetry and foley  Consult PT  DVT prophylaxis: Lovenox Code Status: Full Family Communication: No family at bedside Disposition Plan: Home likely 1-2 days  [name], MD  [facility] Direct contact: [phone] --[place] [place]  --[email or web address]; password [email or web address]  7PM-7AM contact night coverage as above 01/25/2016, 10:43 AM  LOS: 0 days   Consultants:  None  Procedures:  None  Antimicrobials:  Rocephin 8/6 >>  HPI/Subjective: Doing well today. Poor sleep last night. No nausea or vomiting. Some soreness where he fell.  Objective: Vitals:   01/25/16 0300 01/25/16 0601 01/25/16 0945 01/25/16 1000  BP:  (!) 166/53    Pulse:  91    Resp:  16  16  Temp: 99 F (37.2 C) (!) 101.6 F (38.7 C)  99 F (37.2 C)  TempSrc: Oral Oral  Oral  SpO2:  93%  97%  Weight:      Height:   5\' 6"  (1.676 m)     Intake/Output Summary (Last 24 hours) at 01/25/16 1043 Last data filed at 01/25/16 0602  Gross per 24 hour  Intake           4137.5 ml  Output             1850 ml  Net           2287.5 ml     Filed Weights   01/24/16  1510 01/24/16 1800 01/24/16 2150  Weight: 88.5 kg (195 lb) 88.5 kg (195 lb) 88.2 kg (194 lb 8 oz)    Exam:  Constitutional:  . Appears calm and comfortable Respiratory:  . CTA bilaterally, no w/r/r.  . Respiratory effort normal. No retractions or accessory muscle use Cardiovascular:  . RRR, no m/r/g . No LE extremity edema   . Telemetry SR Abdomen:  . soft ndnt  Note: Foley catheter fully in place  I have personally reviewed following labs and imaging studies:  Blood sugars stable.  Potassium 3.3   LFTs unremarkable  Cr 1.43. No significant change  WBC 12.5, Plts 148  Scheduled Meds: . aspirin  81 mg Oral Daily  . betaxolol  1 drop Both Eyes BID  . carvedilol  12.5 mg Oral BID WC  . cefTRIAXone (ROCEPHIN)  IV  1 g Intravenous Q24H  . dorzolamide  1 drop Left Eye BID  . enoxaparin (LOVENOX) injection  40 mg Subcutaneous Q24H  . losartan  100 mg Oral Daily   And  . hydrochlorothiazide  25 mg Oral Daily  . insulin aspart  0-9 Units Subcutaneous TID WC  . latanoprost  1 drop Both Eyes QHS  . pantoprazole  40 mg Oral Daily  . pilocarpine  1 drop Left Eye QID  . sodium chloride  flush  3 mL Intravenous Q12H   Continuous Infusions:   Principal Problem:   Sepsis ([place]) Active Problems:   Diabetes mellitus type 2, controlled ([place])   CKD (chronic kidney disease), stage III   UTI (lower urinary tract infection)   LOS: 0 days   Time spent 20 minutes  By signing my name below, I, Hilbert Odor, attest that this documentation has been prepared under the direction and in the presence of [place]. Sarajane Jews, MD. Electronically signed: Hilbert Odor, Scribe.  01/25/16, 10:00 AM   I personally performed the services described in this documentation. All medical record entries made by the scribe were at my direction. I have reviewed the chart and agree that the record reflects my personal performance and is accurate and complete. [name], MD

## 2016-01-25 NOTE — Progress Notes (Signed)
Pt asked to call for assistance prior to getting up. Pt refused. Bed alarm reapplied will continue to monitor.

## 2016-01-25 NOTE — Care Management Obs Status (Signed)
[facility] NOTIFICATION   Patient Details  Name: Anthony Murray MRN: [id number] Date of Birth: [birth year]   Medicare Observation Status Notification Given:  Yes    Briant Sites, RN 01/25/2016, 1:22 PM

## 2016-01-26 LAB — GLUCOSE, CAPILLARY
GLUCOSE-CAPILLARY: 180 mg/dL — AB (ref 65–99)
GLUCOSE-CAPILLARY: 173 mg/dL — AB (ref 65–99)
Glucose-Capillary: 134 mg/dL — ABNORMAL HIGH (ref 65–99)
Glucose-Capillary: 135 mg/dL — ABNORMAL HIGH (ref 65–99)

## 2016-01-26 LAB — CBC
HEMATOCRIT: 41 % (ref 39.0–52.0)
Hemoglobin: 13.5 g/dL (ref 13.0–17.0)
MCH: 30.8 pg (ref 26.0–34.0)
MCHC: 32.9 g/dL (ref 30.0–36.0)
MCV: 93.4 fL (ref 78.0–100.0)
Platelets: 135 10*3/uL — ABNORMAL LOW (ref 150–400)
RBC: 4.39 MIL/uL (ref 4.22–5.81)
RDW: 14.5 % (ref 11.5–15.5)
WBC: 10.7 10*3/uL — AB (ref 4.0–10.5)

## 2016-01-26 LAB — BASIC METABOLIC PANEL
ANION GAP: 9 (ref 5–15)
BUN: 20 mg/dL (ref 6–20)
CO2: 26 mmol/L (ref 22–32)
Calcium: 8.1 mg/dL — ABNORMAL LOW (ref 8.9–10.3)
Chloride: 101 mmol/L (ref 101–111)
Creatinine, Ser: 1.56 mg/dL — ABNORMAL HIGH (ref 0.61–1.24)
GFR, EST AFRICAN AMERICAN: 45 mL/min — AB (ref >60)
GFR, EST NON AFRICAN AMERICAN: 39 mL/min — AB (ref >60)
GLUCOSE: 121 mg/dL — AB (ref 65–99)
POTASSIUM: 3.2 mmol/L — AB (ref 3.5–5.1)
Sodium: 136 mmol/L (ref 135–145)

## 2016-01-26 LAB — HEMOGLOBIN A1C
HEMOGLOBIN A1C: 6.7 % — AB (ref 4.8–5.6)
MEAN PLASMA GLUCOSE: 146 mg/dL

## 2016-01-26 MED ORDER — POTASSIUM CHLORIDE CRYS ER 20 MEQ PO TBCR
40.0000 meq | EXTENDED_RELEASE_TABLET | Freq: Once | ORAL | Status: AC
  Administered 2016-01-26: 40 meq via ORAL
  Filled 2016-01-26: qty 2

## 2016-01-26 MED ORDER — TIOTROPIUM BROMIDE MONOHYDRATE 18 MCG IN CAPS
18.0000 ug | ORAL_CAPSULE | Freq: Every day | RESPIRATORY_TRACT | Status: DC
  Filled 2016-01-26: qty 5

## 2016-01-26 NOTE — Progress Notes (Signed)
PROGRESS NOTE  Anthony Murray [id number] DOB: [birth year] DOA: 01/24/2016 PCP: Wende Neighbors, MD  Brief Narrative: 82 year old man presented to the hospital after a fall at home. Complains included low back pain, shortness of breath, ataxia. Febrile 102.9, possible dysuria. Admitted for UTI.  Assessment/Plan: 1. Suspected sepsis secondary to UTI. Much improved. Afebrile 24 hours. Fever with lactic acidosis on admission. Chest x-ray no acute disease, skin appeared unremarkable. CMP unremarkable. WBC minimally elevated. Minimal thrombocytopenia. 2. Ecoli UTI with fever. Urine culture and blood cultures pending 3. Mechanical fall with low back pain. Lumbar film and pelvis film negative. No apparent significant injury. 4. Hypokalemia. Replace potassium.  5. DM type 2. Stable. Continue SSI 6. CKD stage III. Currently at baseline  7. PMH ASCVD   Overall improved  Continue antibiotics, follow-up sensitivity  Replace potassium.   DVT prophylaxis: Lovenox Code Status: Full Family Communication: No family bedside Disposition Plan: Discharge home in 24-48 hours     [name], MD  [facility] Direct contact: [phone] --[address] [place]  --[email or web address]; password [email or web address]  7PM-7AM contact night coverage as above 01/26/2016, 9:15 AM  LOS: 1 day   Consultants:  None   Procedures:  None   Antimicrobials:  None  HPI/Subjective: Feels well. Pain with urination. Eating and breathing well. Denies N/V   Objective: Vitals:   01/25/16 0945 01/25/16 1000 01/25/16 2000 01/26/16 0643  BP:    (!) 160/84  Pulse:    92  Resp:  16 20 17   Temp:  99 F (37.2 C) 99.4 F (37.4 C) 99 F (37.2 C)  TempSrc:  Oral Oral Oral  SpO2:  97% 96%   Weight:      Height: 5\' 6"  (1.676 m)       Intake/Output Summary (Last 24 hours) at 01/26/16 0915 Last data filed at 01/26/16 0300  Gross per 24 hour  Intake                0 ml  Output             1250 ml  Net            -1250 ml      Filed Weights   01/24/16 1510 01/24/16 1800 01/24/16 2150  Weight: 88.5 kg (195 lb) 88.5 kg (195 lb) 88.2 kg (194 lb 8 oz)    Exam:    Constitutional:  . Appears calm and comfortable Respiratory:  . CTA bilaterally, no w/r/r.  . Respiratory effort normal. No retractions or accessory muscle use Cardiovascular:  . RRR, no m/r/g . No LE extremity edema . Soft 1/6 diastolic murmur      Psychiatric:  . judgement and insight appear normal . Mental status o Mood, affect appropriate  I have personally reviewed following labs and imaging studies:  Potassium 3.2  BUN 20, Cr 1.56, stable  Platelets 135, slightly lower  WBC 10.7 trending down   Scheduled Meds: . aspirin  81 mg Oral Daily  . betaxolol  1 drop Both Eyes BID  . carvedilol  12.5 mg Oral BID WC  . cefTRIAXone (ROCEPHIN)  IV  1 g Intravenous Q24H  . dorzolamide  1 drop Left Eye BID  . enoxaparin (LOVENOX) injection  40 mg Subcutaneous Q24H  . losartan  100 mg Oral Daily   And  . hydrochlorothiazide  25 mg Oral Daily  . insulin aspart  0-9 Units Subcutaneous TID WC  . latanoprost  1 drop Both Eyes QHS  . pantoprazole  40 mg Oral Daily  . pilocarpine  1 drop Left Eye QID  . sodium chloride flush  3 mL Intravenous Q12H   Continuous Infusions:   Principal Problem:   Sepsis ([place]) Active Problems:   Diabetes mellitus type 2, controlled ([place])   CKD (chronic kidney disease), stage III   UTI (lower urinary tract infection)   LOS: 1 day   Time spent 20 minutes    By signing my name below, I, Collene Leyden, attest that this documentation has been prepared under the direction and in the presence of [name], MD. Electronically signed: Collene Leyden, Scribe 01/26/16 12:24PM   I personally performed the services described in this documentation. All medical record entries made by the scribe were at my direction. I have reviewed the chart and agree that the record reflects my personal performance and is  accurate and complete. [name], MD
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
## 2016-01-27 LAB — URINE CULTURE: Culture: >=100000 — AB

## 2016-01-27 LAB — GLUCOSE, CAPILLARY
GLUCOSE-CAPILLARY: 145 mg/dL — AB (ref 65–99)
GLUCOSE-CAPILLARY: 203 mg/dL — AB (ref 65–99)

## 2016-01-27 MED ORDER — CEPHALEXIN 500 MG PO CAPS
500.0000 mg | ORAL_CAPSULE | Freq: Three times a day (TID) | ORAL | Status: DC
  Administered 2016-01-27: 500 mg via ORAL
  Filled 2016-01-27: qty 1

## 2016-01-27 MED ORDER — CEPHALEXIN 500 MG PO CAPS: 500.0000 mg | ORAL_CAPSULE | Freq: Three times a day (TID) | ORAL | 0 refills | Status: DC

## 2016-01-27 NOTE — Progress Notes (Signed)
Discharge instructions given, verbalized understanding, out in stable condition via w/c with staff. 

## 2016-01-27 NOTE — Discharge Summary (Signed)
Physician Discharge Summary  Anthony Murray [id number] DOB: 04-Jan-1928 DOA: 01/24/2016  PCP: Wende Neighbors, MD  Admit date: 01/24/2016 Discharge date: 01/27/2016  Recommendations for Outpatient Follow-up:  1. Resolution of UTI. 2. Consider outpatient referral to urology for urinary hesitancy  Follow-up Information    Wende Neighbors, MD. Schedule an appointment as soon as possible for a visit in 1 week(s).   Specialty:  Internal Medicine Contact information: [place] [address] [phone]          Discharge Diagnoses:  1. Sepsis secondary to UTI 2. Ecoli UTI 3. Mechanical fall with low back pain  4. DM type 2  5. CKD stage III  Discharge Condition: Stable  Disposition: Discharge home with a course of keflex to complete 8/11  Diet recommendation: Carb modified   Filed Weights   01/24/16 1510 01/24/16 1800 01/24/16 2150  Weight: 88.5 kg (195 lb) 88.5 kg (195 lb) 88.2 kg (194 lb 8 oz)    History of present illness:  82 year old man presented to the hospital after a fall at home. Complains included low back pain, shortness of breath, ataxia. Febrile 102.9, possible dysuria. Admitted for UTI.  Hospital Course:  Patient was treated with empiric antibiotics, with rapid clinical improvement and resolution of fever, sepsis. He was transitioned to oral antibiotics based on culture data and discharged home. Hospitalization was uncomplicated.  1. Sepsis secondary to UTI. Resolved.  2. Ecoli UTIwith fever. Clinically resolved. 3. Mechanical fall with low back pain. Lumbar film and pelvis film negative.No apparent significant injury. No PT follow up recommended. 4. DM type 2. Stable. Continue SSI 5. CKD stage III. Currently at baseline  6. PMH ASCVD   Discharge Instructions  Discharge Instructions    Diet - low sodium heart healthy    Complete by:  As directed   Discharge instructions    Complete by:  As directed   Call your physician or seek immediate  medical attention for fever, difficulty urinating, pain or worsening of condition.   Increase activity slowly    Complete by:  As directed       Medication List    TAKE these medications   albuterol 108 (90 Base) MCG/ACT inhaler Commonly known as:  PROVENTIL HFA;VENTOLIN HFA Inhale 1 puff into the lungs every 6 (six) hours as needed for wheezing or shortness of breath.   aspirin 81 MG tablet Take 81 mg by mouth daily.   betaxolol 0.5 % ophthalmic suspension Commonly known as:  BETOPTIC-S Place 1 drop into both eyes 2 (two) times daily.   carvedilol 12.5 MG tablet Commonly known as:  COREG Take 12.5 mg by mouth 2 (two) times daily with a meal.   cephALEXin 500 MG capsule Commonly known as:  KEFLEX Take 1 capsule (500 mg total) by mouth every 8 (eight) hours.   dorzolamide 2 % ophthalmic solution Commonly known as:  TRUSOPT Place 1 drop into the left eye 2 (two) times daily.   glipiZIDE 2.5 MG 24 hr tablet Commonly known as:  GLUCOTROL XL Take 5 mg by mouth daily with breakfast.   latanoprost 0.005 % ophthalmic solution Commonly known as:  XALATAN Place 1 drop into both eyes at bedtime.   losartan-hydrochlorothiazide 100-25 MG tablet Commonly known as:  HYZAAR Take 1 tablet by mouth daily.   omeprazole 20 MG capsule Commonly known as:  PRILOSEC Take 20 mg by mouth daily.   pilocarpine 2 % ophthalmic solution Commonly known as:  PILOCAR Place 1 drop  into the left eye 4 (four) times daily.   tiotropium 18 MCG inhalation capsule Commonly known as:  SPIRIVA HANDIHALER Place 1 capsule (18 mcg total) into inhaler and inhale daily.   vitamin C 500 MG tablet Commonly known as:  ASCORBIC ACID Take 500 mg by mouth daily.   vitamin E 400 UNIT capsule Take 400 Units by mouth daily.      Allergies  Allergen Reactions  . Codeine   . Ezetimibe-Simvastatin     REACTION: myalgias  . Naproxen     REACTION: and all nonsteroidals  . Niacin   . Pravastatin Sodium      REACTION: myalgias no    The results of significant diagnostics from this hospitalization (including imaging, microbiology, ancillary and laboratory) are listed below for reference.    Significant Diagnostic Studies: Dg Lumbar Spine Complete  Result Date: 01/24/2016 CLINICAL DATA:  82 year old male with history of trauma from a fall while getting off of the toilet. Lower back pain. EXAM: LUMBAR SPINE - COMPLETE 4+ VIEW COMPARISON:  No priors.  CT the abdomen and pelvis 08/26/2006. FINDINGS: Five views of the lumbar spine demonstrate no definite acute displaced fracture or compression type fracture. There is a 8 mm of anterolisthesis of L4 upon L5, which appears only slightly increased compared to prior CT scan 08/26/2006. Alignment is otherwise anatomic. Multilevel degenerative disc disease, most severe at L5-S1. Multilevel facet arthropathy, also most severe at L5-S1. No defects of the pars interarticularis. IMPRESSION: 1. No definite acute radiographic abnormality of the lumbar spine. 2. Multilevel degenerative disc disease and lumbar spondylosis redemonstrated, with grade 1 anterolisthesis of L4 upon L5. Electronically Signed   By: Vinnie Langton M.D.   On: 01/24/2016 16:36   Dg Pelvis 1-2 Views  Result Date: 01/24/2016 CLINICAL DATA:  Fall, low back pain. EXAM: PELVIS - 1-2 VIEW COMPARISON:  None. FINDINGS: Characterization is slightly limited by patient obliquity. There is no evidence of pelvic fracture or diastasis. No pelvic bone lesions are seen. IMPRESSION: Negative. Electronically Signed   By: Franki Cabot M.D.   On: 01/24/2016 16:34   Dg Chest Port 1 View  Result Date: 01/24/2016 CLINICAL DATA:  CODE SEPSIS, SOB, FEVER, Fall at home getting on toilet. C/o lower back pain. Wife says temp at home was 100.0. C/o weakness. Pt broke the toilet. HISTORY OF DM, HTN, ASCVD, TOBACCO ABUSE EXAM: PORTABLE CHEST 1 VIEW COMPARISON:  10/19/2012 FINDINGS: Normal cardiac silhouette. Low lung volumes. No  pulmonary edema, pneumothorax or infiltrate. Mild bronchitic markings. Exam is lordotic. IMPRESSION: No acute cardiopulmonary process. Electronically Signed   By: Suzy Bouchard M.D.   On: 01/24/2016 15:38    Microbiology: Recent Results (from the past 240 hour(s))  Blood Culture (routine x 2)     Status: None (Preliminary result)   Collection Time: 01/24/16  2:44 PM  Result Value Ref Range Status   Specimen Description BLOOD  Final   Special Requests NONE  Final   Culture NO GROWTH 3 DAYS  Final   Report Status PENDING  Incomplete  Blood Culture (routine x 2)     Status: None (Preliminary result)   Collection Time: 01/24/16  2:45 PM  Result Value Ref Range Status   Specimen Description BLOOD  Final   Special Requests NONE  Final   Culture NO GROWTH 3 DAYS  Final   Report Status PENDING  Incomplete  Urine culture     Status: Abnormal   Collection Time: 01/24/16  3:05 PM  Result  Value Ref Range Status   Specimen Description URINE, CATHETERIZED  Final   Special Requests NONE  Final   Culture >=100,000 COLONIES/mL ESCHERICHIA COLI (A)  Final   Report Status 01/27/2016 FINAL  Final   Organism ID, Bacteria ESCHERICHIA COLI (A)  Final      Susceptibility   Escherichia coli - MIC*    AMPICILLIN <=2 SENSITIVE Sensitive     CEFAZOLIN <=4 SENSITIVE Sensitive     CEFTRIAXONE <=1 SENSITIVE Sensitive     CIPROFLOXACIN <=0.25 SENSITIVE Sensitive     GENTAMICIN <=1 SENSITIVE Sensitive     IMIPENEM <=0.25 SENSITIVE Sensitive     NITROFURANTOIN <=16 SENSITIVE Sensitive     TRIMETH/SULFA <=20 SENSITIVE Sensitive     AMPICILLIN/SULBACTAM <=2 SENSITIVE Sensitive     PIP/TAZO <=4 SENSITIVE Sensitive     Extended ESBL NEGATIVE Sensitive     * >=100,000 COLONIES/mL ESCHERICHIA COLI     Labs: Basic Metabolic Panel:  Recent Labs Lab 01/24/16 1443 01/25/16 0624 01/26/16 0615  NA 139 140 136  K 3.9 3.3* 3.2*  CL 103 107 101  CO2 23 24 26   GLUCOSE 140* 99 121*  BUN 20 17 20   CREATININE  1.46* 1.43* 1.56*  CALCIUM 9.6 8.0* 8.1*   Liver Function Tests:  Recent Labs Lab 01/24/16 1443 01/25/16 0624  AST 29 22  ALT 39 29  ALKPHOS 55 37*  BILITOT 1.0 1.0  PROT 7.4 5.7*  ALBUMIN 4.3 3.2*   CBC:  Recent Labs Lab 01/24/16 1443 01/25/16 0624 01/26/16 0615  WBC 11.7* 12.5* 10.7*  NEUTROABS 9.9*  --   --   HGB 16.0 13.5 13.5  HCT 47.9 40.8 41.0  MCV 92.3 93.2 93.4  PLT 166 148* 135*    CBG:  Recent Labs Lab 01/26/16 1106 01/26/16 1617 01/26/16 2052 01/27/16 0748 01/27/16 1131  GLUCAP 180* 173* 135* 145* 203*    Principal Problem:   Sepsis ([place]) Active Problems:   Diabetes mellitus type 2, controlled ([place])   CKD (chronic kidney disease), stage III   UTI (lower urinary tract infection)   Time coordinating discharge: 35 Minutes   Signed:  [name], MD [facility] 01/27/2016, 4:23 PM   By signing my name below, I, Collene Leyden, attest that this documentation has been prepared under the direction and in the presence of [name], MD. Electronically signed: Collene Leyden, Scribe 01/27/16 11:12 AM   I personally performed the services described in this documentation. All medical record entries made by the scribe were at my direction. I have reviewed the chart and agree that the record reflects my personal performance and is accurate and complete. [name], MD

## 2016-01-27 NOTE — Progress Notes (Signed)
PROGRESS NOTE  Anthony Anthony DOB: 03/15/1928 DOA: 01/24/2016 PCP: Wende Neighbors, MD  Brief Narrative: 82 year old man presented to the hospital after a fall at home. Complains included low back pain, shortness of breath, ataxia. Febrile 102.9, possible dysuria. Admitted for UTI.  Assessment/Plan: 1. Sepsis secondary to UTI. Resolved.  2. Ecoli UTIwith fever. Clinically resolved. 3. Mechanical fall with low back pain. Lumbar film and pelvis film negative.No apparent significant injury. No PT follow up recommended. 4. DM type 2. Stable. Continue SSI 5. CKD stage III. Currently at baseline  6. PMH ASCVD   Overall improved.   Discharge home with a course of Keflex to complete  8/11  Anthony Hodgkins, MD  [facility] Direct contact: [phone] --Via amion app OR  --[email or web address]; password [email or web address]  7PM-7AM contact night coverage as above 01/27/2016, 7:33 AM  LOS: 2 days   Consultants:  None  Procedures:  None   Antimicrobials:  Ceftriaxone 8/6 >> 8/7  Keflex 8/8 >> 8/11  HPI/Subjective:  Feels better. Did not sleep well. Breathing and eating well. Denies any pain.  Objective: Vitals:   01/26/16 0800 01/26/16 1442 01/26/16 2000 01/27/16 0612  BP:  106/62  (!) 150/81  Pulse: 82 81 80 81  Resp: 16 18 16    Temp: 98.4 F (36.9 C) 98.2 F (36.8 C) 98.2 F (36.8 C) 97.8 F (36.6 C)  TempSrc:  Oral Oral Oral  SpO2: 97% 94% 93% 97%  Weight:      Height:        Intake/Output Summary (Last 24 hours) at 01/27/16 0733 Last data filed at 01/27/16 [id number]  Gross per 24 hour  Intake              963 ml  Output             1700 ml  Net             -737 ml     Filed Weights   01/24/16 1510 01/24/16 1800 01/24/16 2150  Weight: 88.5 kg (195 lb) 88.5 kg (195 lb) 88.2 kg (194 lb 8 oz)    Exam:    Constitutional:  . Appears calm and comfortable .  Respiratory:  . CTA bilaterally, no w/r/r.  . Respiratory effort normal. No retractions or accessory  muscle use Cardiovascular:  . RRR, no m/r/g . No LE extremity edema   Psychiatric. Grossly normal mood and affect.  I have personally reviewed following labs and imaging studies:  CBG 145   Scheduled Meds: . aspirin  81 mg Oral Daily  . betaxolol  1 drop Both Eyes BID  . carvedilol  12.5 mg Oral BID WC  . cefTRIAXone (ROCEPHIN)  IV  1 g Intravenous Q24H  . dorzolamide  1 drop Left Eye BID  . enoxaparin (LOVENOX) injection  40 mg Subcutaneous Q24H  . losartan  100 mg Oral Daily   And  . hydrochlorothiazide  25 mg Oral Daily  . insulin aspart  0-9 Units Subcutaneous TID WC  . latanoprost  1 drop Both Eyes QHS  . pantoprazole  40 mg Oral Daily  . pilocarpine  1 drop Left Eye QID  . sodium chloride flush  3 mL Intravenous Q12H  . tiotropium  18 mcg Inhalation Daily   Continuous Infusions:   Principal Problem:   Sepsis ([place]) Active Problems:   Diabetes mellitus type 2, controlled ([place])   CKD (chronic kidney disease), stage III   UTI (lower urinary tract infection)   LOS:  2 days      By signing my name below, I, Collene Leyden, attest that this documentation has been prepared under the direction and in the presence of Anthony Hodgkins, MD. Electronically signed: Collene Leyden, Parchment 01/27/16 11:12 AM   I personally performed the services described in this documentation. All medical record entries made by the scribe were at my direction. I have reviewed the chart and agree that the record reflects my personal performance and is accurate and complete. Anthony Hodgkins, MD

## 2016-01-29 LAB — CULTURE, BLOOD (ROUTINE X 2)
CULTURE: NO GROWTH
Culture: NO GROWTH

## 2016-01-30 DIAGNOSIS — H401112 Primary open-angle glaucoma, right eye, moderate stage: Secondary | ICD-10-CM | POA: Diagnosis not present

## 2016-01-30 DIAGNOSIS — H401123 Primary open-angle glaucoma, left eye, severe stage: Secondary | ICD-10-CM | POA: Diagnosis not present

## 2016-01-30 DIAGNOSIS — H04123 Dry eye syndrome of bilateral lacrimal glands: Secondary | ICD-10-CM | POA: Diagnosis not present

## 2016-01-30 DIAGNOSIS — Z961 Presence of intraocular lens: Secondary | ICD-10-CM | POA: Diagnosis not present

## 2016-02-03 DIAGNOSIS — E1122 Type 2 diabetes mellitus with diabetic chronic kidney disease: Secondary | ICD-10-CM | POA: Diagnosis not present

## 2016-02-03 DIAGNOSIS — N183 Chronic kidney disease, stage 3 (moderate): Secondary | ICD-10-CM | POA: Diagnosis not present

## 2016-02-03 DIAGNOSIS — A4151 Sepsis due to Escherichia coli [E. coli]: Secondary | ICD-10-CM | POA: Diagnosis not present

## 2016-02-03 DIAGNOSIS — R3911 Hesitancy of micturition: Secondary | ICD-10-CM | POA: Diagnosis not present

## 2016-03-01 ENCOUNTER — Encounter: Payer: Self-pay | Admitting: Cardiology

## 2016-03-01 DIAGNOSIS — E1122 Type 2 diabetes mellitus with diabetic chronic kidney disease: Secondary | ICD-10-CM | POA: Diagnosis not present

## 2016-03-01 DIAGNOSIS — E782 Mixed hyperlipidemia: Secondary | ICD-10-CM | POA: Diagnosis not present

## 2016-03-04 DIAGNOSIS — N183 Chronic kidney disease, stage 3 (moderate): Secondary | ICD-10-CM | POA: Diagnosis not present

## 2016-03-04 DIAGNOSIS — I10 Essential (primary) hypertension: Secondary | ICD-10-CM | POA: Diagnosis not present

## 2016-03-04 DIAGNOSIS — J449 Chronic obstructive pulmonary disease, unspecified: Secondary | ICD-10-CM | POA: Diagnosis not present

## 2016-03-04 DIAGNOSIS — E1122 Type 2 diabetes mellitus with diabetic chronic kidney disease: Secondary | ICD-10-CM | POA: Diagnosis not present

## 2016-03-04 DIAGNOSIS — N401 Enlarged prostate with lower urinary tract symptoms: Secondary | ICD-10-CM | POA: Diagnosis not present

## 2016-03-04 DIAGNOSIS — Z0001 Encounter for general adult medical examination with abnormal findings: Secondary | ICD-10-CM | POA: Diagnosis not present

## 2016-03-04 DIAGNOSIS — I2510 Atherosclerotic heart disease of native coronary artery without angina pectoris: Secondary | ICD-10-CM | POA: Diagnosis not present

## 2016-03-04 DIAGNOSIS — Z23 Encounter for immunization: Secondary | ICD-10-CM | POA: Diagnosis not present

## 2016-03-04 DIAGNOSIS — E782 Mixed hyperlipidemia: Secondary | ICD-10-CM | POA: Diagnosis not present

## 2016-03-04 DIAGNOSIS — H409 Unspecified glaucoma: Secondary | ICD-10-CM | POA: Diagnosis not present

## 2016-03-10 ENCOUNTER — Other Ambulatory Visit: Payer: Self-pay | Admitting: Cardiology

## 2016-03-30 ENCOUNTER — Ambulatory Visit (INDEPENDENT_AMBULATORY_CARE_PROVIDER_SITE_OTHER): Payer: PPO | Admitting: Urology

## 2016-03-30 DIAGNOSIS — N3000 Acute cystitis without hematuria: Secondary | ICD-10-CM

## 2016-03-30 DIAGNOSIS — N401 Enlarged prostate with lower urinary tract symptoms: Secondary | ICD-10-CM | POA: Diagnosis not present

## 2016-03-30 DIAGNOSIS — N3281 Overactive bladder: Secondary | ICD-10-CM

## 2016-04-13 ENCOUNTER — Ambulatory Visit (INDEPENDENT_AMBULATORY_CARE_PROVIDER_SITE_OTHER): Payer: PPO | Admitting: Urology

## 2016-04-13 DIAGNOSIS — N401 Enlarged prostate with lower urinary tract symptoms: Secondary | ICD-10-CM

## 2016-05-20 DIAGNOSIS — J449 Chronic obstructive pulmonary disease, unspecified: Secondary | ICD-10-CM | POA: Diagnosis not present

## 2016-06-11 ENCOUNTER — Encounter: Payer: Self-pay | Admitting: *Deleted

## 2016-06-11 ENCOUNTER — Encounter: Payer: Self-pay | Admitting: Cardiology

## 2016-06-11 ENCOUNTER — Ambulatory Visit (INDEPENDENT_AMBULATORY_CARE_PROVIDER_SITE_OTHER): Payer: PPO | Admitting: Cardiology

## 2016-06-11 VITALS — BP 155/81 | HR 81 | Ht 66.5 in | Wt 200.0 lb

## 2016-06-11 DIAGNOSIS — I350 Nonrheumatic aortic (valve) stenosis: Secondary | ICD-10-CM | POA: Diagnosis not present

## 2016-06-11 DIAGNOSIS — I2510 Atherosclerotic heart disease of native coronary artery without angina pectoris: Secondary | ICD-10-CM

## 2016-06-11 DIAGNOSIS — E782 Mixed hyperlipidemia: Secondary | ICD-10-CM

## 2016-06-11 DIAGNOSIS — I6529 Occlusion and stenosis of unspecified carotid artery: Secondary | ICD-10-CM | POA: Diagnosis not present

## 2016-06-11 NOTE — Progress Notes (Signed)
Clinical Summary Anthony Murray is a 82 y.o.male seen today for follow up of the following medical problems.   1. Aortic stenosis - 08/2014 echo LVEF 55-60%, mod to severe AS (mean grad 11, AVA0.99) 08/2015 echo LVEF 93-71%, grade I diastolic dysfunction, mean grad 10, dimensionless index 0.39, AVA VTI 0.88  - breathing improved since last visit, since change in inhalers.  - no recent chest pain  2. COPD - brathing  better since pcp changed him to anoro.   3. CAD - cath 11/2004 with moderate non-obstructive disease - echo 09/2012 LVEF 55-60%  - denies any chest pain since last visit  4. HL - reports statins have caused muscle aches including pravastatin  - Jan 2016 TC 173 TG 238 HDL 29 LDL 96 - has labs upcming wth pcp  5. Carotid stenosis - carotid US 69/6789 with 38-10% LICA and RICA without significant stenosis.  - denies any neuro symptoms since last visit   6. HTN - home bp's around 140s/70s     SH: enjoys working on garden.   Past Medical History:  Diagnosis Date  . ASCVD (arteriosclerotic cardiovascular disease)    50% ostial left left anterior desending 60% mid circumflex and normal ejection fractioon in 2006 exertional dyspnea;history of chest discomfort  . Cerebrovascular disease    minimal carotid bruits  . Diabetes mellitus type 2, controlled (Anthony Murray)    no insulin  . Hyperlipidemia    statin intolerant  . Hypertension   . Tobacco abuse, in remission    30 pack years stopped in 1971     Allergies  Allergen Reactions  . Codeine   . Ezetimibe-Simvastatin     REACTION: myalgias  . Naproxen     REACTION: and all nonsteroidals  . Niacin   . Pravastatin Sodium     REACTION: myalgias no     Current Outpatient Prescriptions  Medication Sig Dispense Refill  . albuterol (PROVENTIL HFA;VENTOLIN HFA) 108 (90 Base) MCG/ACT inhaler Inhale 1 puff into the lungs every 6 (six) hours as needed for wheezing or shortness of breath.    Marland Kitchen  aspirin 81 MG tablet Take 81 mg by mouth daily.    . betaxolol (BETOPTIC-S) 0.5 % ophthalmic suspension Place 1 drop into both eyes 2 (two) times daily.     . carvedilol (COREG) 12.5 MG tablet Take 12.5 mg by mouth 2 (two) times daily with a meal.     . cephALEXin (KEFLEX) 500 MG capsule Take 1 capsule (500 mg total) by mouth every 8 (eight) hours. 12 capsule 0  . dorzolamide (TRUSOPT) 2 % ophthalmic solution Place 1 drop into the left eye 2 (two) times daily.      Marland Kitchen glipiZIDE (GLUCOTROL XL) 2.5 MG 24 hr tablet Take 5 mg by mouth daily with breakfast.     . latanoprost (XALATAN) 0.005 % ophthalmic solution Place 1 drop into both eyes at bedtime.      Marland Kitchen losartan-hydrochlorothiazide (HYZAAR) 100-25 MG tablet Take 1 tablet by mouth daily. 90 tablet 3  . omeprazole (PRILOSEC) 20 MG capsule Take 20 mg by mouth daily.      . pilocarpine (PILOCAR) 2 % ophthalmic solution Place 1 drop into the left eye 4 (four) times daily.     Marland Kitchen SPIRIVA HANDIHALER 18 MCG inhalation capsule INHALE 1 CAPSULE DAILY USING HANDIHALER DEVICE AS DIRECTED. 30 capsule 1  . vitamin C (ASCORBIC ACID) 500 MG tablet Take 500 mg by mouth daily.    . vitamin  E 400 UNIT capsule Take 400 Units by mouth daily.     No current facility-administered medications for this visit.      Past Surgical History:  Procedure Laterality Date  . APPENDECTOMY    . CARPAL TUNNEL RELEASE     surgery left 05/05/2001  . KNEE ARTHROSCOPY     bilaterial; unknown associated surgical procedure  . SHOULDER SURGERY     Right x2; third procedure on left  . TONSILLECTOMY    . TRANSURETHRAL INCISION OF PROSTATE     resection of the prostate     Allergies  Allergen Reactions  . Codeine   . Ezetimibe-Simvastatin     REACTION: myalgias  . Naproxen     REACTION: and all nonsteroidals  . Niacin   . Pravastatin Sodium     REACTION: myalgias no      No family history on file.   Social History [name] reports that he quit smoking about 46  years ago. His smoking use included Cigarettes. He has a 40.00 pack-year smoking history. He has never used smokeless tobacco. [name] reports that he does not drink alcohol.   Review of Systems CONSTITUTIONAL: No weight loss, fever, chills, weakness or fatigue.  HEENT: Eyes: No visual loss, blurred vision, double vision or yellow sclerae.No hearing loss, sneezing, congestion, runny nose or sore throat.  SKIN: No rash or itching.  CARDIOVASCULAR: per hpi RESPIRATORY: No shortness of breath, cough or sputum.  GASTROINTESTINAL: No anorexia, nausea, vomiting or diarrhea. No abdominal pain or blood.  GENITOURINARY: No burning on urination, no polyuria NEUROLOGICAL: No headache, dizziness, syncope, paralysis, ataxia, numbness or tingling in the extremities. No change in bowel or bladder control.  MUSCULOSKELETAL: No muscle, back pain, joint pain or stiffness.  LYMPHATICS: No enlarged nodes. No history of splenectomy.  PSYCHIATRIC: No history of depression or anxiety.  ENDOCRINOLOGIC: No reports of sweating, cold or heat intolerance. No polyuria or polydipsia.  Marland Kitchen   Physical Examination Vitals:   06/11/16 1059  BP: (!) 155/81  Pulse: 81   Vitals:   06/11/16 1059  Weight: 200 lb (90.7 kg)  Height: 5' 6.5" (1.689 m)    Gen: resting comfortably, no acute distress HEENT: no scleral icterus, pupils equal round and reactive, no palptable cervical adenopathy,  CV: RRR, no m/r/g, no jvd Resp: Clear to auscultation bilaterally GI: abdomen is soft, non-tender, non-distended, normal bowel sounds, no hepatosplenomegaly MSK: extremities are warm, no edema.  Skin: warm, no rash Neuro:  no focal deficits Psych: appropriate affect   Diagnostic Studies 09/2012 stress echo:  Study Conclusions  - Stress ECG conclusions: There were no significant stress arrhythmias or conduction abnormalities; rare PVCs were present during exercise and in recovery. The stress ECG was negative for  ischemia. - Staged echo: There was no echocardiographic evidence for stress-induced ischemia.  09/2012 echo Study Conclusions  - Left ventricle: The cavity size was normal. Wall thickness was increased in a pattern of mild LVH. There was moderate focal basal hypertrophy of the septum. Systolic function was normal. The estimated ejection fraction was in the range of 55% to 60%. Wall motion was normal; there were no regional wall motion abnormalities. - Aortic valve: Mildly to moderately calcified annulus. Moderately thickened, moderately calcified leaflets. Valve mobility was mildly restricted. There was mild stenosis. Trivial regurgitation. Valve area: 0.9cm^2(VTI). Valve area: 0.79cm^2 (Vmax). - Aorta: The aorta was moderately calcified. - Mitral valve: Calcified annulus. Mildly calcified leaflets . - Atrial septum: No defect or patent foramen  ovale was identified.  11/2004 Cath FINDINGS: 1. Patient does not have dextrocardia. The heart is normally located in  the left side of the chest. Aortic arch appears normal based on the  course of the catheter through it. 2. LV 167/19/25. 3. No aortic stenosis. 4. Left main: Angiographically normal. 5. LAD: Large vessel giving rise to a single, very large, diagonal. There  is a 50% stenosis of the ostium of the LAD. 6. Circumflex: Large vessel giving rise to three obtuse marginals. There  is a 60% stenosis of the mid vessel. 7. RCA: Tortuous, dominant vessel. It is angiographically normal.  IMPRESSION: 1. No dextrocardia. 2. No previously placed coronary stent. 3. Moderate nonobstructive coronary disease. 4. Elevated left ventricular end diastolic pressure in the setting of  systemic hypertension.  08/2014 echo Study Conclusions  - Left ventricle: The cavity size was normal. There was mild focal basal hypertrophy of the septum. Systolic function was  normal. The estimated ejection fraction was in the range of 55% to 60%. Wall motion was normal; there were no regional wall motion abnormalities. Doppler parameters are consistent with abnormal left ventricular relaxation (grade 1 diastolic dysfunction). - Aortic valve: Functionally bicuspid; moderately calcified leaflets. Cusp separation was reduced. There was moderate to severe stenosis. There was mild regurgitation. Mean gradient (S): 11 mm Hg. Peak gradient (S): 22 mm Hg. VTI ratio of LVOT to aortic valve: 0.44. Valve area (VTI): 0.99 cm^2. - Mitral valve: Calcified annulus. There was trivial regurgitation. - Left atrium: The atrium was mildly dilated. - Right atrium: Central venous pressure (est): 8 mm Hg. - Tricuspid valve: There was trivial regurgitation. - Pulmonary arteries: Systolic pressure could not be accurately estimated. - Pericardium, extracardiac: There was no pericardial effusion.  Impressions:  - Mild basal septal hypertrophy with LVEF 55-60% and grade 1 diastolic dysfunction. Mild left atrial enlargement. Functionally bicuspid aortic valve with mild aortic regurgitation and moderate to severe aortic stenosis. Measures are somewhat discordant with relatively low gradients and LVOT/AV ratio 0.44, but planimetry and VTI valve area around 1 cm2. Unable to assess PASP.   01/2015 PFTs Mild to moderate ventilatory defect   08/2015 echo  Study Conclusions  - Left ventricle: The cavity size was normal. Wall thickness was  increased in a pattern of mild LVH. Systolic function was normal.  The estimated ejection fraction was in the range of 60% to 65%.  Wall motion was normal; there were no regional wall motion  abnormalities. Doppler parameters are consistent with abnormal  left ventricular relaxation (grade 1 diastolic dysfunction). - Aortic valve: Mildly to moderately calcified annulus.  Functionally bicuspid. There was mild  regurgitation. Mean  gradient (S): 10 mm Hg. Peak gradient (S): 20 mm Hg. VTI ratio of  LVOT to aortic valve: 0.39. Valve area (VTI): 0.88 cm^2. - Mitral valve: Calcified annulus. There was trivial regurgitation. - Left atrium: The atrium was at the upper limits of normal in  size. - Right atrium: Central venous pressure (est): 3 mm Hg. - Tricuspid valve: There was trivial regurgitation. - Pulmonary arteries: Systolic pressure could not be accurately  estimated. - Pericardium, extracardiac: There was no pericardial effusion.  Impressions:  - Mild LVH with LVEF 60-65%. Grade 1 diastolic dysfunction with  normal estimated LV filling pressure. Upper normal left atrial  chamber size. MAC with trivial mitral regurgitation. Functionally  bicuspid, calcified aortic valve with at least moderate aortic  stenosis. As mentioned in the prior study, measurements are  discordant with relatively low gradients but  LVOT/AV VTI ratio  0.39. Unable to get good valve planimetry on this particular  study. There is mild aortic regurgitation.     Assessment and Plan  1. Aortic stenosis - no significant symptoms. Most parameters favor moderate AS by last echo. Continue to monitor.   2. COPD - continue inhalers, followed by pcp  3. CAD - no current chest pain - continue currnent meds  4. HL - intolerant to statins, continue dietary modification. LDL 96 on last check, would not consider zetia or pcsk9 inhibtor at this time.   5. HTN -bp's elevated, he will submit bp log in 2 weeks at his pcp appt.. Pending results consider med change. Goal BP < 130/80 due to DM2 history.  - submit bp long in 2 weeks.   6. Carotid stenosis - repeat carotid US 05/2016  F/u 6 months      [name], M.D.

## 2016-06-11 NOTE — Patient Instructions (Signed)
Your physician wants you to follow-up in: [place] DR[address] BRANCH  You will receive a reminder letter in the mail two months in advance. If you don't receive a letter, please call our office to schedule the follow-up appointment.  Your physician recommends that you continue on your current medications as directed. Please refer to the Current Medication list given to you today.  Your physician has requested that you have a carotid duplex. This test is an ultrasound of the carotid arteries in your neck. It looks at blood flow through these arteries that supply the brain with blood. Allow one hour for this exam. There are no restrictions or special instructions.  Your physician has requested that you regularly monitor and record your blood pressure readings at home FOR 2 [place]. Please use the same machine at the same time of day to check your readings and record them to bring to your follow-up visit.   Thank you for choosing [facility]!!

## 2016-06-15 ENCOUNTER — Other Ambulatory Visit: Payer: Self-pay | Admitting: Cardiology

## 2016-06-24 DIAGNOSIS — J441 Chronic obstructive pulmonary disease with (acute) exacerbation: Secondary | ICD-10-CM | POA: Diagnosis not present

## 2016-06-24 DIAGNOSIS — J440 Chronic obstructive pulmonary disease with acute lower respiratory infection: Secondary | ICD-10-CM | POA: Diagnosis not present

## 2016-07-06 DIAGNOSIS — E1122 Type 2 diabetes mellitus with diabetic chronic kidney disease: Secondary | ICD-10-CM | POA: Diagnosis not present

## 2016-07-06 DIAGNOSIS — E782 Mixed hyperlipidemia: Secondary | ICD-10-CM | POA: Diagnosis not present

## 2016-07-06 DIAGNOSIS — I10 Essential (primary) hypertension: Secondary | ICD-10-CM | POA: Diagnosis not present

## 2016-07-14 ENCOUNTER — Ambulatory Visit: Payer: PPO

## 2016-07-14 DIAGNOSIS — I6529 Occlusion and stenosis of unspecified carotid artery: Secondary | ICD-10-CM

## 2016-07-14 DIAGNOSIS — I6523 Occlusion and stenosis of bilateral carotid arteries: Secondary | ICD-10-CM | POA: Diagnosis not present

## 2016-07-14 LAB — VAS US CAROTID
LCCAPSYS: 111 cm/s
LEFT ECA DIAS: -3 cm/s
LEFT VERTEBRAL DIAS: 13 cm/s
LICADSYS: -118 cm/s
Left CCA dist dias: -16 cm/s
Left CCA dist sys: -79 cm/s
Left CCA prox dias: 15 cm/s
Left ICA dist dias: -28 cm/s
Left ICA prox dias: -43 cm/s
Left ICA prox sys: -182 cm/s
RCCADSYS: -70 cm/s
RCCAPDIAS: 7 cm/s
RIGHT ECA DIAS: 0 cm/s
RIGHT VERTEBRAL DIAS: -13 cm/s
Right CCA prox sys: 96 cm/s

## 2016-07-16 ENCOUNTER — Telehealth: Payer: Self-pay | Admitting: *Deleted

## 2016-07-16 NOTE — Telephone Encounter (Signed)
-----   Message from [name], MD sent at 07/15/2016  3:10 PM EST ----- Mild to moderate blockages on both sides in the arteries of the neck, we will continue to monitor   Zandra Abts MD

## 2016-07-16 NOTE — Telephone Encounter (Signed)
Pt aware - routed to pcp  

## 2016-07-19 DIAGNOSIS — N401 Enlarged prostate with lower urinary tract symptoms: Secondary | ICD-10-CM | POA: Diagnosis not present

## 2016-07-19 DIAGNOSIS — E1122 Type 2 diabetes mellitus with diabetic chronic kidney disease: Secondary | ICD-10-CM | POA: Diagnosis not present

## 2016-07-19 DIAGNOSIS — Z0001 Encounter for general adult medical examination with abnormal findings: Secondary | ICD-10-CM | POA: Diagnosis not present

## 2016-07-19 DIAGNOSIS — I2510 Atherosclerotic heart disease of native coronary artery without angina pectoris: Secondary | ICD-10-CM | POA: Diagnosis not present

## 2016-07-19 DIAGNOSIS — H409 Unspecified glaucoma: Secondary | ICD-10-CM | POA: Diagnosis not present

## 2016-07-19 DIAGNOSIS — J449 Chronic obstructive pulmonary disease, unspecified: Secondary | ICD-10-CM | POA: Diagnosis not present

## 2016-07-19 DIAGNOSIS — N183 Chronic kidney disease, stage 3 (moderate): Secondary | ICD-10-CM | POA: Diagnosis not present

## 2016-07-19 DIAGNOSIS — E782 Mixed hyperlipidemia: Secondary | ICD-10-CM | POA: Diagnosis not present

## 2016-07-19 DIAGNOSIS — I10 Essential (primary) hypertension: Secondary | ICD-10-CM | POA: Diagnosis not present

## 2016-07-27 ENCOUNTER — Encounter: Payer: Self-pay | Admitting: Internal Medicine

## 2016-07-30 DIAGNOSIS — H401123 Primary open-angle glaucoma, left eye, severe stage: Secondary | ICD-10-CM | POA: Diagnosis not present

## 2016-07-30 DIAGNOSIS — E119 Type 2 diabetes mellitus without complications: Secondary | ICD-10-CM | POA: Diagnosis not present

## 2016-07-30 DIAGNOSIS — Z961 Presence of intraocular lens: Secondary | ICD-10-CM | POA: Diagnosis not present

## 2016-07-30 DIAGNOSIS — H401112 Primary open-angle glaucoma, right eye, moderate stage: Secondary | ICD-10-CM | POA: Diagnosis not present

## 2016-07-30 DIAGNOSIS — H04123 Dry eye syndrome of bilateral lacrimal glands: Secondary | ICD-10-CM | POA: Diagnosis not present

## 2016-09-10 DIAGNOSIS — H401112 Primary open-angle glaucoma, right eye, moderate stage: Secondary | ICD-10-CM | POA: Diagnosis not present

## 2016-09-10 DIAGNOSIS — H401123 Primary open-angle glaucoma, left eye, severe stage: Secondary | ICD-10-CM | POA: Diagnosis not present

## 2016-09-29 DIAGNOSIS — E782 Mixed hyperlipidemia: Secondary | ICD-10-CM | POA: Diagnosis not present

## 2016-09-29 DIAGNOSIS — N401 Enlarged prostate with lower urinary tract symptoms: Secondary | ICD-10-CM | POA: Diagnosis not present

## 2016-09-29 DIAGNOSIS — E1122 Type 2 diabetes mellitus with diabetic chronic kidney disease: Secondary | ICD-10-CM | POA: Diagnosis not present

## 2016-09-29 DIAGNOSIS — I10 Essential (primary) hypertension: Secondary | ICD-10-CM | POA: Diagnosis not present

## 2016-10-01 DIAGNOSIS — E1122 Type 2 diabetes mellitus with diabetic chronic kidney disease: Secondary | ICD-10-CM | POA: Diagnosis not present

## 2016-10-01 DIAGNOSIS — R3911 Hesitancy of micturition: Secondary | ICD-10-CM | POA: Diagnosis not present

## 2016-10-01 DIAGNOSIS — J449 Chronic obstructive pulmonary disease, unspecified: Secondary | ICD-10-CM | POA: Diagnosis not present

## 2016-10-01 DIAGNOSIS — I10 Essential (primary) hypertension: Secondary | ICD-10-CM | POA: Diagnosis not present

## 2016-10-01 DIAGNOSIS — I2510 Atherosclerotic heart disease of native coronary artery without angina pectoris: Secondary | ICD-10-CM | POA: Diagnosis not present

## 2016-10-01 DIAGNOSIS — H409 Unspecified glaucoma: Secondary | ICD-10-CM | POA: Diagnosis not present

## 2016-10-01 DIAGNOSIS — N183 Chronic kidney disease, stage 3 (moderate): Secondary | ICD-10-CM | POA: Diagnosis not present

## 2016-10-01 DIAGNOSIS — E782 Mixed hyperlipidemia: Secondary | ICD-10-CM | POA: Diagnosis not present

## 2016-10-01 DIAGNOSIS — N401 Enlarged prostate with lower urinary tract symptoms: Secondary | ICD-10-CM | POA: Diagnosis not present

## 2016-10-01 DIAGNOSIS — M19049 Primary osteoarthritis, unspecified hand: Secondary | ICD-10-CM | POA: Diagnosis not present

## 2016-10-01 DIAGNOSIS — Z6832 Body mass index (BMI) 32.0-32.9, adult: Secondary | ICD-10-CM | POA: Diagnosis not present

## 2016-12-16 DIAGNOSIS — H401123 Primary open-angle glaucoma, left eye, severe stage: Secondary | ICD-10-CM | POA: Diagnosis not present

## 2016-12-16 DIAGNOSIS — H401112 Primary open-angle glaucoma, right eye, moderate stage: Secondary | ICD-10-CM | POA: Diagnosis not present

## 2016-12-28 DIAGNOSIS — L821 Other seborrheic keratosis: Secondary | ICD-10-CM | POA: Diagnosis not present

## 2016-12-28 DIAGNOSIS — L218 Other seborrheic dermatitis: Secondary | ICD-10-CM | POA: Diagnosis not present

## 2016-12-28 DIAGNOSIS — D1801 Hemangioma of skin and subcutaneous tissue: Secondary | ICD-10-CM | POA: Diagnosis not present

## 2016-12-28 DIAGNOSIS — L570 Actinic keratosis: Secondary | ICD-10-CM | POA: Diagnosis not present

## 2016-12-28 DIAGNOSIS — L814 Other melanin hyperpigmentation: Secondary | ICD-10-CM | POA: Diagnosis not present

## 2016-12-28 DIAGNOSIS — Z85828 Personal history of other malignant neoplasm of skin: Secondary | ICD-10-CM | POA: Diagnosis not present

## 2016-12-29 DIAGNOSIS — E1122 Type 2 diabetes mellitus with diabetic chronic kidney disease: Secondary | ICD-10-CM | POA: Diagnosis not present

## 2016-12-29 DIAGNOSIS — N401 Enlarged prostate with lower urinary tract symptoms: Secondary | ICD-10-CM | POA: Diagnosis not present

## 2016-12-29 DIAGNOSIS — I10 Essential (primary) hypertension: Secondary | ICD-10-CM | POA: Diagnosis not present

## 2016-12-31 DIAGNOSIS — E1122 Type 2 diabetes mellitus with diabetic chronic kidney disease: Secondary | ICD-10-CM | POA: Diagnosis not present

## 2016-12-31 DIAGNOSIS — N401 Enlarged prostate with lower urinary tract symptoms: Secondary | ICD-10-CM | POA: Diagnosis not present

## 2016-12-31 DIAGNOSIS — J449 Chronic obstructive pulmonary disease, unspecified: Secondary | ICD-10-CM | POA: Diagnosis not present

## 2016-12-31 DIAGNOSIS — F339 Major depressive disorder, recurrent, unspecified: Secondary | ICD-10-CM | POA: Diagnosis not present

## 2016-12-31 DIAGNOSIS — E782 Mixed hyperlipidemia: Secondary | ICD-10-CM | POA: Diagnosis not present

## 2016-12-31 DIAGNOSIS — I2510 Atherosclerotic heart disease of native coronary artery without angina pectoris: Secondary | ICD-10-CM | POA: Diagnosis not present

## 2016-12-31 DIAGNOSIS — I10 Essential (primary) hypertension: Secondary | ICD-10-CM | POA: Diagnosis not present

## 2016-12-31 DIAGNOSIS — N183 Chronic kidney disease, stage 3 (moderate): Secondary | ICD-10-CM | POA: Diagnosis not present

## 2016-12-31 DIAGNOSIS — H409 Unspecified glaucoma: Secondary | ICD-10-CM | POA: Diagnosis not present

## 2016-12-31 DIAGNOSIS — M19049 Primary osteoarthritis, unspecified hand: Secondary | ICD-10-CM | POA: Diagnosis not present

## 2017-01-06 ENCOUNTER — Encounter: Payer: Self-pay | Admitting: Cardiology

## 2017-01-06 ENCOUNTER — Ambulatory Visit (INDEPENDENT_AMBULATORY_CARE_PROVIDER_SITE_OTHER): Payer: PPO | Admitting: Cardiology

## 2017-01-06 ENCOUNTER — Telehealth: Payer: Self-pay | Admitting: Cardiology

## 2017-01-06 VITALS — BP 128/68 | HR 77 | Ht 66.5 in | Wt 200.0 lb

## 2017-01-06 DIAGNOSIS — J441 Chronic obstructive pulmonary disease with (acute) exacerbation: Secondary | ICD-10-CM | POA: Diagnosis not present

## 2017-01-06 DIAGNOSIS — I350 Nonrheumatic aortic (valve) stenosis: Secondary | ICD-10-CM

## 2017-01-06 DIAGNOSIS — E782 Mixed hyperlipidemia: Secondary | ICD-10-CM | POA: Diagnosis not present

## 2017-01-06 DIAGNOSIS — I10 Essential (primary) hypertension: Secondary | ICD-10-CM | POA: Diagnosis not present

## 2017-01-06 DIAGNOSIS — I2510 Atherosclerotic heart disease of native coronary artery without angina pectoris: Secondary | ICD-10-CM | POA: Diagnosis not present

## 2017-01-06 MED ORDER — AZITHROMYCIN 250 MG PO TABS: ORAL_TABLET | ORAL | 0 refills | Status: DC

## 2017-01-06 MED ORDER — PREDNISONE 20 MG PO TABS: 40.0000 mg | ORAL_TABLET | Freq: Every day | ORAL | 0 refills | Status: DC

## 2017-01-06 NOTE — Progress Notes (Signed)
Clinical Summary [name] is a 83 y.o.male  seen today for follow up of the following medical problems.   1. Aortic stenosis - 08/2014 echo LVEF 55-60%, mod to severe AS (mean grad 11, AVA0.99) 08/2015 echo LVEF 50-09%, grade I diastolic dysfunction, mean grad 10, dimensionless index 0.39, AVA VTI 0.88 - breathing improved with previous change in inhalers.   - no recent chest pain, no syncope. Recent cough/wheezing/SOB that started with initial sore throat.   2. COPD - sore throat about 2.5 weeks ago. Later developed cough, productive yellowish. Cough slowly getting better - occasional wheezing.   3. CAD - cath 11/2004 with moderate non-obstructive disease - echo 09/2012 LVEF 55-60%  - no recent chest pain  4. HL - reports statins have caused muscle aches including pravastatin  - currently diet controlled - most recent labs with pcp  5. Carotid stenosis - Jan 2018 carotid US: RICA 3-81%, LICA 82-99%.  - no recent neuro symptoms   6. HTN - compliant with meds  7. CKD III - followed by nephrology.    SH: enjoys working on garden.   Past Medical History:  Diagnosis Date  . ASCVD (arteriosclerotic cardiovascular disease)    50% ostial left left anterior desending 60% mid circumflex and normal ejection fractioon in 2006 exertional dyspnea;history of chest discomfort  . Cerebrovascular disease    minimal carotid bruits  . Diabetes mellitus type 2, controlled ([place])    no insulin  . Hyperlipidemia    statin intolerant  . Hypertension   . Tobacco abuse, in remission    30 pack years stopped in 1971     Allergies  Allergen Reactions  . Codeine   . Ezetimibe-Simvastatin     REACTION: myalgias  . Naproxen     REACTION: and all nonsteroidals  . Niacin   . Pravastatin Sodium     REACTION: myalgias no     Current Outpatient Prescriptions  Medication Sig Dispense Refill  . albuterol (PROVENTIL HFA;VENTOLIN HFA) 108 (90 Base) MCG/ACT inhaler  Inhale 1 puff into the lungs every 6 (six) hours as needed for wheezing or shortness of breath.    Marland Kitchen aspirin 81 MG tablet Take 81 mg by mouth daily.    . betaxolol (BETOPTIC-S) 0.5 % ophthalmic suspension Place 1 drop into both eyes 2 (two) times daily.     . carvedilol (COREG) 12.5 MG tablet Take 12.5 mg by mouth 2 (two) times daily with a meal.     . dorzolamide (TRUSOPT) 2 % ophthalmic solution Place 1 drop into the left eye 2 (two) times daily.      Marland Kitchen GLIPIZIDE XL 5 MG 24 hr tablet Take 1 tablet by mouth daily.    Marland Kitchen latanoprost (XALATAN) 0.005 % ophthalmic solution Place 1 drop into both eyes at bedtime.      Marland Kitchen losartan-hydrochlorothiazide (HYZAAR) 100-25 MG tablet TAKE ONE TABLET BY MOUTH ONCE DAILY. 30 tablet 6  . omeprazole (PRILOSEC) 20 MG capsule Take 20 mg by mouth daily.      . pilocarpine (PILOCAR) 2 % ophthalmic solution Place 1 drop into the left eye 4 (four) times daily.     Marland Kitchen umeclidinium-vilanterol (ANORO ELLIPTA) 62.5-25 MCG/INH AEPB Inhale 1 puff into the lungs daily.    . vitamin C (ASCORBIC ACID) 500 MG tablet Take 500 mg by mouth daily.     No current facility-administered medications for this visit.      Past Surgical History:  Procedure Laterality Date  .  APPENDECTOMY    . CARPAL TUNNEL RELEASE     surgery left 05/05/2001  . KNEE ARTHROSCOPY     bilaterial; unknown associated surgical procedure  . SHOULDER SURGERY     Right x2; third procedure on left  . TONSILLECTOMY    . TRANSURETHRAL INCISION OF PROSTATE     resection of the prostate     Allergies  Allergen Reactions  . Codeine   . Ezetimibe-Simvastatin     REACTION: myalgias  . Naproxen     REACTION: and all nonsteroidals  . Niacin   . Pravastatin Sodium     REACTION: myalgias no      No family history on file.   Social History Mr. Nastasi reports that he quit smoking about 47 years ago. His smoking use included Cigarettes. He has a 40.00 pack-year smoking history. He has never used  smokeless tobacco. Mr. Hanken reports that he does not drink alcohol.   Review of Systems CONSTITUTIONAL: No weight loss, fever, chills, weakness or fatigue.  HEENT: Eyes: No visual loss, blurred vision, double vision or yellow sclerae.No hearing loss, sneezing, congestion, runny nose or sore throat.  SKIN: No rash or itching.  CARDIOVASCULAR: per hpi RESPIRATORY: per hpi.  GASTROINTESTINAL: No anorexia, nausea, vomiting or diarrhea. No abdominal pain or blood.  GENITOURINARY: No burning on urination, no polyuria NEUROLOGICAL: No headache, dizziness, syncope, paralysis, ataxia, numbness or tingling in the extremities. No change in bowel or bladder control.  MUSCULOSKELETAL: No muscle, back pain, joint pain or stiffness.  LYMPHATICS: No enlarged nodes. No history of splenectomy.  PSYCHIATRIC: No history of depression or anxiety.  ENDOCRINOLOGIC: No reports of sweating, cold or heat intolerance. No polyuria or polydipsia.  Marland Kitchen   Physical Examination Vitals:   01/06/17 1505  BP: 128/68  Pulse: 77   Vitals:   01/06/17 1505  Weight: 200 lb (90.7 kg)  Height: 5' 6.5" (1.689 m)    Gen: resting comfortably, no acute distress HEENT: no scleral icterus, pupils equal round and reactive, no palptable cervical adenopathy,  CV: RRR, no m/r/g, no jvd Resp: Clear to auscultation bilaterally GI: abdomen is soft, non-tender, non-distended, normal bowel sounds, no hepatosplenomegaly MSK: extremities are warm, no edema.  Skin: warm, no rash Neuro:  no focal deficits Psych: appropriate affect   Diagnostic Studies 09/2012 stress echo:  Study Conclusions  - Stress ECG conclusions: There were no significant stress arrhythmias or conduction abnormalities; rare PVCs were present during exercise and in recovery. The stress ECG was negative for ischemia. - Staged echo: There was no echocardiographic evidence for stress-induced ischemia.  09/2012 echo Study Conclusions  - Left  ventricle: The cavity size was normal. Wall thickness was increased in a pattern of mild LVH. There was moderate focal basal hypertrophy of the septum. Systolic function was normal. The estimated ejection fraction was in the range of 55% to 60%. Wall motion was normal; there were no regional wall motion abnormalities. - Aortic valve: Mildly to moderately calcified annulus. Moderately thickened, moderately calcified leaflets. Valve mobility was mildly restricted. There was mild stenosis. Trivial regurgitation. Valve area: 0.9cm^2(VTI). Valve area: 0.79cm^2 (Vmax). - Aorta: The aorta was moderately calcified. - Mitral valve: Calcified annulus. Mildly calcified leaflets . - Atrial septum: No defect or patent foramen ovale was identified.  11/2004 Cath FINDINGS: 1. Patient does not have dextrocardia. The heart is normally located in  the left side of the chest. Aortic arch appears normal based on the  course of the catheter through it. 2.  LV 167/19/25. 3. No aortic stenosis. 4. Left main: Angiographically normal. 5. LAD: Large vessel giving rise to a single, very large, diagonal. There  is a 50% stenosis of the ostium of the LAD. 6. Circumflex: Large vessel giving rise to three obtuse marginals. There  is a 60% stenosis of the mid vessel. 7. RCA: Tortuous, dominant vessel. It is angiographically normal.  IMPRESSION: 1. No dextrocardia. 2. No previously placed coronary stent. 3. Moderate nonobstructive coronary disease. 4. Elevated left ventricular end diastolic pressure in the setting of  systemic hypertension.  08/2014 echo Study Conclusions  - Left ventricle: The cavity size was normal. There was mild focal basal hypertrophy of the septum. Systolic function was normal. The estimated ejection fraction was in the range of 55% to 60%. Wall motion was normal; there were no regional wall  motion abnormalities. Doppler parameters are consistent with abnormal left ventricular relaxation (grade 1 diastolic dysfunction). - Aortic valve: Functionally bicuspid; moderately calcified leaflets. Cusp separation was reduced. There was moderate to severe stenosis. There was mild regurgitation. Mean gradient (S): 11 mm Hg. Peak gradient (S): 22 mm Hg. VTI ratio of LVOT to aortic valve: 0.44. Valve area (VTI): 0.99 cm^2. - Mitral valve: Calcified annulus. There was trivial regurgitation. - Left atrium: The atrium was mildly dilated. - Right atrium: Central venous pressure (est): 8 mm Hg. - Tricuspid valve: There was trivial regurgitation. - Pulmonary arteries: Systolic pressure could not be accurately estimated. - Pericardium, extracardiac: There was no pericardial effusion.  Impressions:  - Mild basal septal hypertrophy with LVEF 55-60% and grade 1 diastolic dysfunction. Mild left atrial enlargement. Functionally bicuspid aortic valve with mild aortic regurgitation and moderate to severe aortic stenosis. Measures are somewhat discordant with relatively low gradients and LVOT/AV ratio 0.44, but planimetry and VTI valve area around 1 cm2. Unable to assess PASP.  01/2015 PFTs Mild to moderate ventilatory defect   08/2015 echo  Study Conclusions  - Left ventricle: The cavity size was normal. Wall thickness was  increased in a pattern of mild LVH. Systolic function was normal.  The estimated ejection fraction was in the range of 60% to 65%.  Wall motion was normal; there were no regional wall motion  abnormalities. Doppler parameters are consistent with abnormal  left ventricular relaxation (grade 1 diastolic dysfunction). - Aortic valve: Mildly to moderately calcified annulus.  Functionally bicuspid. There was mild regurgitation. Mean  gradient (S): 10 mm Hg. Peak gradient (S): 20 mm Hg. VTI ratio of  LVOT to aortic valve: 0.39. Valve area  (VTI): 0.88 cm^2. - Mitral valve: Calcified annulus. There was trivial regurgitation. - Left atrium: The atrium was at the upper limits of normal in  size. - Right atrium: Central venous pressure (est): 3 mm Hg. - Tricuspid valve: There was trivial regurgitation. - Pulmonary arteries: Systolic pressure could not be accurately  estimated. - Pericardium, extracardiac: There was no pericardial effusion.  Impressions:  - Mild LVH with LVEF 60-65%. Grade 1 diastolic dysfunction with  normal estimated LV filling pressure. Upper normal left atrial  chamber size. MAC with trivial mitral regurgitation. Functionally  bicuspid, calcified aortic valve with at least moderate aortic  stenosis. As mentioned in the prior study, measurements are  discordant with relatively low gradients but LVOT/AV VTI ratio  0.39. Unable to get good valve planimetry on this particular  study. There is mild aortic regurgitation.     Assessment and Plan  1. Aortic stenosis - no significant symptoms. Most parameters favor moderate AS by  last echo. - we will repeat echo  2. Acute COPD exacerbation - 5 day course of azithromycin and prednisone 35m daily.   3. CAD - no current chest pain - continue current therapy  4. HL - intolerant to statins, continue dietary modification. LDL has been reasonable, does not require alternative therapies.    5. HTN - at goal, conitnue current meds   6. Carotid stenosis - no symptoms - conitnue to monitor  F/u 6 months      JArnoldo Lenis M.D.

## 2017-01-06 NOTE — Patient Instructions (Signed)
Your physician wants you to follow-up in: [place] will receive a reminder letter in the mail two months in advance. If you don't receive a letter, please call our office to schedule the follow-up appointment.  Your physician has recommended you make the following change in your medication:   TAKE AZITHROMYCIN 500 MG (2 TABLETS) DAY 1 THEN TAKE 250 MG (1 TABLET) DAILY FOR 4 DAYS  TAKE PREDNISONE 40 MG (2 TABLETS) DAILY FOR 5 DAYS   Your physician has requested that you have an echocardiogram. Echocardiography is a painless test that uses sound waves to create images of your heart. It provides your doctor with information about the size and shape of your heart and how well your heart's chambers and valves are working. This procedure takes approximately one hour. There are no restrictions for this procedure.   Thank you for choosing [facility]!!

## 2017-01-06 NOTE — Telephone Encounter (Signed)
Pre-cert Verification for the following procedure   Echo scheduled for 02/03/17

## 2017-01-13 DIAGNOSIS — H401112 Primary open-angle glaucoma, right eye, moderate stage: Secondary | ICD-10-CM | POA: Diagnosis not present

## 2017-01-13 DIAGNOSIS — H401123 Primary open-angle glaucoma, left eye, severe stage: Secondary | ICD-10-CM | POA: Diagnosis not present

## 2017-01-13 DIAGNOSIS — Z961 Presence of intraocular lens: Secondary | ICD-10-CM | POA: Diagnosis not present

## 2017-01-28 DIAGNOSIS — Z6831 Body mass index (BMI) 31.0-31.9, adult: Secondary | ICD-10-CM | POA: Diagnosis not present

## 2017-01-28 DIAGNOSIS — F339 Major depressive disorder, recurrent, unspecified: Secondary | ICD-10-CM | POA: Diagnosis not present

## 2017-01-28 DIAGNOSIS — E1122 Type 2 diabetes mellitus with diabetic chronic kidney disease: Secondary | ICD-10-CM | POA: Diagnosis not present

## 2017-02-01 ENCOUNTER — Ambulatory Visit (INDEPENDENT_AMBULATORY_CARE_PROVIDER_SITE_OTHER): Payer: PPO | Admitting: Urology

## 2017-02-01 ENCOUNTER — Other Ambulatory Visit: Payer: Self-pay | Admitting: Cardiology

## 2017-02-01 DIAGNOSIS — R3914 Feeling of incomplete bladder emptying: Secondary | ICD-10-CM

## 2017-02-01 DIAGNOSIS — N401 Enlarged prostate with lower urinary tract symptoms: Secondary | ICD-10-CM

## 2017-02-02 ENCOUNTER — Encounter (HOSPITAL_COMMUNITY): Payer: Self-pay | Admitting: *Deleted

## 2017-02-02 ENCOUNTER — Emergency Department (HOSPITAL_COMMUNITY): Payer: PPO

## 2017-02-02 ENCOUNTER — Emergency Department (HOSPITAL_COMMUNITY)
Admission: EM | Admit: 2017-02-02 | Discharge: 2017-02-03 | Disposition: A | Discharge status: Home / Self Care | Payer: PPO | Attending: Emergency Medicine | Admitting: Emergency Medicine

## 2017-02-02 DIAGNOSIS — I129 Hypertensive chronic kidney disease with stage 1 through stage 4 chronic kidney disease, or unspecified chronic kidney disease: Secondary | ICD-10-CM | POA: Diagnosis not present

## 2017-02-02 DIAGNOSIS — Z7982 Long term (current) use of aspirin: Secondary | ICD-10-CM | POA: Insufficient documentation

## 2017-02-02 DIAGNOSIS — Y92009 Unspecified place in unspecified non-institutional (private) residence as the place of occurrence of the external cause: Secondary | ICD-10-CM

## 2017-02-02 DIAGNOSIS — Z7984 Long term (current) use of oral hypoglycemic drugs: Secondary | ICD-10-CM | POA: Insufficient documentation

## 2017-02-02 DIAGNOSIS — R51 Headache: Secondary | ICD-10-CM | POA: Diagnosis not present

## 2017-02-02 DIAGNOSIS — M25552 Pain in left hip: Secondary | ICD-10-CM | POA: Diagnosis not present

## 2017-02-02 DIAGNOSIS — S7222XA Displaced subtrochanteric fracture of left femur, initial encounter for closed fracture: Secondary | ICD-10-CM | POA: Diagnosis not present

## 2017-02-02 DIAGNOSIS — S7292XA Unspecified fracture of left femur, initial encounter for closed fracture: Secondary | ICD-10-CM | POA: Diagnosis not present

## 2017-02-02 DIAGNOSIS — Z79899 Other long term (current) drug therapy: Secondary | ICD-10-CM | POA: Diagnosis not present

## 2017-02-02 DIAGNOSIS — S0990XA Unspecified injury of head, initial encounter: Secondary | ICD-10-CM | POA: Diagnosis not present

## 2017-02-02 DIAGNOSIS — Y929 Unspecified place or not applicable: Secondary | ICD-10-CM | POA: Diagnosis not present

## 2017-02-02 DIAGNOSIS — S79912A Unspecified injury of left hip, initial encounter: Secondary | ICD-10-CM | POA: Diagnosis not present

## 2017-02-02 DIAGNOSIS — Y939 Activity, unspecified: Secondary | ICD-10-CM | POA: Diagnosis not present

## 2017-02-02 DIAGNOSIS — W01198A Fall on same level from slipping, tripping and stumbling with subsequent striking against other object, initial encounter: Secondary | ICD-10-CM | POA: Insufficient documentation

## 2017-02-02 DIAGNOSIS — E1122 Type 2 diabetes mellitus with diabetic chronic kidney disease: Secondary | ICD-10-CM | POA: Insufficient documentation

## 2017-02-02 DIAGNOSIS — R531 Weakness: Secondary | ICD-10-CM | POA: Diagnosis not present

## 2017-02-02 DIAGNOSIS — R404 Transient alteration of awareness: Secondary | ICD-10-CM | POA: Diagnosis not present

## 2017-02-02 DIAGNOSIS — S72122A Displaced fracture of lesser trochanter of left femur, initial encounter for closed fracture: Secondary | ICD-10-CM | POA: Diagnosis not present

## 2017-02-02 DIAGNOSIS — R102 Pelvic and perineal pain: Secondary | ICD-10-CM | POA: Diagnosis not present

## 2017-02-02 DIAGNOSIS — Y999 Unspecified external cause status: Secondary | ICD-10-CM | POA: Insufficient documentation

## 2017-02-02 DIAGNOSIS — Z87891 Personal history of nicotine dependence: Secondary | ICD-10-CM | POA: Diagnosis not present

## 2017-02-02 DIAGNOSIS — W19XXXA Unspecified fall, initial encounter: Secondary | ICD-10-CM

## 2017-02-02 DIAGNOSIS — Z961 Presence of intraocular lens: Secondary | ICD-10-CM | POA: Diagnosis not present

## 2017-02-02 DIAGNOSIS — S72002A Fracture of unspecified part of neck of left femur, initial encounter for closed fracture: Secondary | ICD-10-CM

## 2017-02-02 DIAGNOSIS — N183 Chronic kidney disease, stage 3 (moderate): Secondary | ICD-10-CM | POA: Diagnosis not present

## 2017-02-02 DIAGNOSIS — H401123 Primary open-angle glaucoma, left eye, severe stage: Secondary | ICD-10-CM | POA: Diagnosis not present

## 2017-02-02 DIAGNOSIS — I2510 Atherosclerotic heart disease of native coronary artery without angina pectoris: Secondary | ICD-10-CM | POA: Insufficient documentation

## 2017-02-02 DIAGNOSIS — J9811 Atelectasis: Secondary | ICD-10-CM | POA: Diagnosis not present

## 2017-02-02 HISTORY — DX: Nonrheumatic aortic (valve) stenosis: I35.0

## 2017-02-02 HISTORY — DX: Chronic kidney disease, unspecified: N18.9

## 2017-02-02 LAB — CBC WITH DIFFERENTIAL/PLATELET
Basophils Absolute: 0 10*3/uL (ref 0.0–0.1)
Basophils Relative: 0 %
Eosinophils Absolute: 0.2 10*3/uL (ref 0.0–0.7)
Eosinophils Relative: 2 %
HEMATOCRIT: 41.4 % (ref 39.0–52.0)
HEMOGLOBIN: 14 g/dL (ref 13.0–17.0)
LYMPHS ABS: 1.1 10*3/uL (ref 0.7–4.0)
Lymphocytes Relative: 11 %
MCH: 30.9 pg (ref 26.0–34.0)
MCHC: 33.8 g/dL (ref 30.0–36.0)
MCV: 91.4 fL (ref 78.0–100.0)
Monocytes Absolute: 0.8 10*3/uL (ref 0.1–1.0)
Monocytes Relative: 8 %
NEUTROS ABS: 7.6 10*3/uL (ref 1.7–7.7)
NEUTROS PCT: 79 %
Platelets: 206 10*3/uL (ref 150–400)
RBC: 4.53 MIL/uL (ref 4.22–5.81)
RDW: 14.4 % (ref 11.5–15.5)
WBC: 9.6 10*3/uL (ref 4.0–10.5)

## 2017-02-02 MED ORDER — MORPHINE SULFATE (PF) 4 MG/ML IV SOLN
4.0000 mg | INTRAVENOUS | Status: AC | PRN
  Administered 2017-02-02 – 2017-02-03 (×2): 4 mg via INTRAVENOUS
  Filled 2017-02-02 (×2): qty 1

## 2017-02-02 MED ORDER — SODIUM CHLORIDE 0.9 % IV SOLN
INTRAVENOUS | Status: DC
  Administered 2017-02-02: via INTRAVENOUS

## 2017-02-02 MED ORDER — ONDANSETRON HCL 4 MG/2ML IJ SOLN
4.0000 mg | INTRAMUSCULAR | Status: DC | PRN
  Administered 2017-02-02: 4 mg via INTRAVENOUS
  Filled 2017-02-02: qty 2

## 2017-02-02 NOTE — ED Triage Notes (Signed)
Pt brought in by [facility] for c/o fall; pt states he lost his balance and fell backwards striking his head and injured his left hip

## 2017-02-02 NOTE — Progress Notes (Addendum)
Patient ID: Anthony Murray, male   DOB: 11-Aug-1927, 83 y.o.   MRN: [id number] [facility]   10 CKD III, CAD , AS mod to sev  left subtroch femur fracture   He has to be transferred based on his medical conditions  He is not a candidate for surgery at [facility] with those medical problems   Medical History:  1. Aortic stenosis - 08/2014 echo LVEF 55-60%, mod to severe AS (mean grad 11, AVA0.99) 08/2015 echo LVEF 60-10%, grade I diastolic dysfunction, mean grad 10, dimensionless index 0.39, AVA VTI 0.88   - breathing improved with previous change in inhalers.    - no recent chest pain, no syncope. Recent cough/wheezing/SOB that started with initial sore throat.    2. COPD - sore throat about 2.5 weeks ago. Later developed cough, productive yellowish. Cough slowly getting better - occasional wheezing.    3. CAD - cath 11/2004 with moderate non-obstructive disease - echo 09/2012 LVEF 55-60%   - no recent chest pain   4. HL - reports statins have caused muscle aches including pravastatin   - currently diet controlled - most recent labs with pcp   5. Carotid stenosis - Jan 2018 carotid US: RICA 9-32%, LICA 35-57%.   - no recent neuro symptoms     6. HTN - compliant with meds   7. CKD III - followed by nephrology.      SH: enjoys working on garden.         Past Medical History:  Diagnosis Date  . ASCVD (arteriosclerotic cardiovascular disease)      50% ostial left left anterior desending 60% mid circumflex and normal ejection fractioon in 2006 exertional dyspnea;history of chest discomfort  . Cerebrovascular disease      minimal carotid bruits  . Diabetes mellitus type 2, controlled ([place])      no insulin  . Hyperlipidemia      statin intolerant  . Hypertension    . Tobacco abuse, in remission      30 pack years stopped in 1971

## 2017-02-02 NOTE — ED Provider Notes (Signed)
[facility] DEPT Provider Note   CSN: [id number] Arrival date & time: 02/02/17  2117     History   Chief Complaint Chief Complaint  Patient presents with  . Fall    HPI Anthony SCHNELLER is a 83 y.o. male.  HPI  Pt was seen at 2145. Per EMS, pt's family and pt, c/o sudden onset and resolution of one episode of slip and fall that occurred PTA. Pt states he fell backwards onto his left hip. Pt states he hit his head. Pt was unable to stand up due to left hip pain. Pain worsens with palpation of the area and movement. Denies LOC, no AMS, no back pain, no prodromal symptoms before fall, no CP/SOB, no abd pain, no N/V/D, no focal motor weakness, no tingling/numbness in extremities.    Past Medical History:  Diagnosis Date  . Aortic valve stenosis   . ASCVD (arteriosclerotic cardiovascular disease)    50% ostial left left anterior desending 60% mid circumflex and normal ejection fractioon in 2006 exertional dyspnea;history of chest discomfort  . Cerebrovascular disease    minimal carotid bruits  . CKD (chronic kidney disease)   . Diabetes mellitus type 2, controlled ([place])    no insulin  . Hyperlipidemia    statin intolerant  . Hypertension   . Tobacco abuse, in remission    30 pack years stopped in 1971    Patient Active Problem List   Diagnosis Date Noted  . Sepsis ([place]) 01/25/2016  . UTI (lower urinary tract infection) 01/24/2016  . Aortic stenosis 10/19/2012  . CKD (chronic kidney disease), stage III 10/10/2011  . Peripheral vascular disease ([place]) 10/10/2011  . Tobacco abuse, in remission   . Diabetes mellitus type 2, controlled ([place])   . Arteriosclerotic cardiovascular disease (ASCVD) 08/28/2009  . CEREBROVASCULAR DISEASE 08/28/2009  . HYPERLIPIDEMIA 12/09/2008  . Essential hypertension 12/09/2008    Past Surgical History:  Procedure Laterality Date  . APPENDECTOMY    . CARPAL TUNNEL RELEASE     surgery left 05/05/2001  . KNEE ARTHROSCOPY     bilaterial;  unknown associated surgical procedure  . SHOULDER SURGERY     Right x2; third procedure on left  . TONSILLECTOMY    . TRANSURETHRAL INCISION OF PROSTATE     resection of the prostate       Home Medications    Prior to Admission medications   Medication Sig Start Date End Date Taking? Authorizing Provider  albuterol (PROVENTIL HFA;VENTOLIN HFA) 108 (90 Base) MCG/ACT inhaler Inhale 1 puff into the lungs every 6 (six) hours as needed for wheezing or shortness of breath.    [provider]  aspirin 81 MG tablet Take 81 mg by mouth daily.    [provider]  azithromycin (ZITHROMAX) 250 MG tablet TAKE 2 TABLETS DAY 1 THEN 1 TABLET DAILY FOR 4 DAYS 01/06/17   [name], MD  betaxolol (BETOPTIC-S) 0.5 % ophthalmic suspension Place 1 drop into both eyes 2 (two) times daily.  12/15/15   [provider]  carvedilol (COREG) 12.5 MG tablet Take 12.5 mg by mouth 2 (two) times daily with a meal.  09/28/12   [provider]  dorzolamide (TRUSOPT) 2 % ophthalmic solution Place 1 drop into the left eye 2 (two) times daily.      [provider]  GLIPIZIDE XL 5 MG 24 hr tablet Take 1 tablet by mouth 2 (two) times daily.  06/05/16   [provider]  losartan-hydrochlorothiazide (HYZAAR) 100-25 MG  tablet TAKE ONE TABLET BY MOUTH ONCE DAILY. 02/01/17   [name], MD  Netarsudil Dimesylate (RHOPRESSA) 0.02 % SOLN Place 1 drop into the left eye at bedtime.    [provider]  omeprazole (PRILOSEC) 20 MG capsule Take 20 mg by mouth daily.      [provider]  pilocarpine (PILOCAR) 2 % ophthalmic solution Place 1 drop into the left eye 4 (four) times daily.     [provider]  predniSONE (DELTASONE) 20 MG tablet Take 2 tablets (40 mg total) by mouth daily with breakfast. 01/06/17   [name], MD  sertraline (ZOLOFT) 25 MG tablet Take 25 mg by mouth daily.    [provider]  sitaGLIPtin (JANUVIA) 50 MG  tablet Take 50 mg by mouth daily.    [provider]  umeclidinium-vilanterol (ANORO ELLIPTA) 62.5-25 MCG/INH AEPB Inhale 1 puff into the lungs daily.    [provider]  vitamin C (ASCORBIC ACID) 500 MG tablet Take 500 mg by mouth daily.    [provider]    Family History History reviewed. No pertinent family history.  Social History Social History  Substance Use Topics  . Smoking status: Former Smoker    Packs/day: 1.00    Years: 40.00    Types: Cigarettes    Quit date: 10/29/1969  . Smokeless tobacco: Never Used  . Alcohol use No     Allergies   Codeine; Ezetimibe-simvastatin; Naproxen; Niacin; and Pravastatin sodium   Review of Systems Review of Systems ROS: Statement: All systems negative except as marked or noted in the HPI; Constitutional: Negative for fever and chills. ; ; Eyes: Negative for eye pain, redness and discharge. ; ; ENMT: Negative for ear pain, hoarseness, nasal congestion, sinus pressure and sore throat. ; ; Cardiovascular: Negative for chest pain, palpitations, diaphoresis, dyspnea and peripheral edema. ; ; Respiratory: Negative for cough, wheezing and stridor. ; ; Gastrointestinal: Negative for nausea, vomiting, diarrhea, abdominal pain, blood in stool, hematemesis, jaundice and rectal bleeding. . ; ; Genitourinary: Negative for dysuria, flank pain and hematuria. ; ; Musculoskeletal: +left hip pain, head injury. Negative for back pain and neck pain. Negative for swelling and deformity.; ; Skin: Negative for pruritus, rash, abrasions, blisters, bruising and skin lesion.; ; Neuro: Negative for headache, lightheadedness and neck stiffness. Negative for weakness, altered level of consciousness, altered mental status, extremity weakness, paresthesias, involuntary movement, seizure and syncope.       Physical Exam Updated Vital Signs BP (!) 156/75 (BP Location: Right Arm)   Pulse 71   Temp 98.1 F (36.7 C) (Oral)   Resp 16   Ht 5'  6.5" (1.689 m)   Wt 90.7 kg (200 lb)   SpO2 93%   BMI 31.80 kg/m   Patient Vitals for the past 24 hrs:  BP Temp Temp src Pulse Resp SpO2 Height Weight  02/03/17 0010 (!) 162/96 - - 87 17 93 % - -  02/02/17 2300 (!) 146/94 - - 85 - 93 % - -  02/02/17 2230 (!) 140/103 - - 83 - 96 % - -  02/02/17 2123 (!) 156/75 98.1 F (36.7 C) Oral 71 16 93 % - -  02/02/17 2119 - - - - - - 5' 6.5" (1.689 m) 90.7 kg (200 lb)     Physical Exam 2150: Physical examination:  Nursing notes reviewed; Vital signs and O2 SAT reviewed;  Constitutional: Well developed, Well nourished, Well hydrated, In no acute distress; Head:  Normocephalic,  atraumatic; Eyes: EOMI, PERRL, No scleral icterus; ENMT: Mouth and pharynx normal, Mucous membranes moist; Neck: Supple, Full range of motion, No lymphadenopathy; Cardiovascular: Regular rate and rhythm, No gallop; Respiratory: Breath sounds clear & equal bilaterally, No wheezes.  Speaking full sentences with ease, Normal respiratory effort/excursion; Chest: Nontender, Movement normal; Abdomen: Soft, Nontender, Nondistended, Normal bowel sounds; Genitourinary: No CVA tenderness; Extremities: Pulses normal, Pelvis stable. +left hip tenderness to palp, LLE externally rotated. NT left knee/ankle/foot. +palp pedal pulses.; Neuro: AA&Ox3, Major CN grossly intact.  Speech clear. +LLE decreased ROM, otherwise no gross focal motor or sensory deficits in extremities.; Skin: Color normal, Warm, Dry.   ED Treatments / Results  Labs (all labs ordered are listed, but only abnormal results are displayed)   EKG  EKG Interpretation  Date/Time:  Wednesday February 02 2017 23:20:06 EDT Ventricular Rate:  83 PR Interval:    QRS Duration: 97 QT Interval:  367 QTC Calculation: 432 R Axis:   -11 Text Interpretation:  Sinus rhythm Inferior infarct, old Lateral leads are also involved When compared with ECG of 01/24/2016 Rate slower Reconfirmed by Francine Graven [id number]) on 02/02/2017 11:43:07  PM       Radiology   Procedures Procedures (including critical care time)  Medications Ordered in ED Medications  morphine 4 MG/ML injection 4 mg (4 mg Intravenous Given 02/02/17 2213)  ondansetron (ZOFRAN) injection 4 mg (4 mg Intravenous Given 02/02/17 2213)     Initial Impression / Assessment and Plan / ED Course  I have reviewed the triage vital signs and the nursing notes.  Pertinent labs & imaging results that were available during my care of the patient were reviewed by me and considered in my medical decision making (see chart for details).  MDM Reviewed: previous chart, nursing note and vitals Reviewed previous: labs Interpretation: labs, x-ray and CT scan   Results for orders placed or performed during the hospital encounter of 18/29/93  Basic metabolic panel  Result Value Ref Range   Sodium 142 135 - 145 mmol/L   Potassium 3.3 (L) 3.5 - 5.1 mmol/L   Chloride 106 101 - 111 mmol/L   CO2 26 22 - 32 mmol/L   Glucose, Bld 187 (H) 65 - 99 mg/dL   BUN 25 (H) 6 - 20 mg/dL   Creatinine, Ser 1.58 (H) 0.61 - 1.24 mg/dL   Calcium 9.1 8.9 - 10.3 mg/dL   GFR calc non Af Amer 38 (L) >60 mL/min   GFR calc Af Amer 44 (L) >60 mL/min   Anion gap 10 5 - 15  Troponin I  Result Value Ref Range   Troponin I <0.03 <0.03 ng/mL  CBC with Differential  Result Value Ref Range   WBC 9.6 4.0 - 10.5 K/uL   RBC 4.53 4.22 - 5.81 MIL/uL   Hemoglobin 14.0 13.0 - 17.0 g/dL   HCT 41.4 39.0 - 52.0 %   MCV 91.4 78.0 - 100.0 fL   MCH 30.9 26.0 - 34.0 pg   MCHC 33.8 30.0 - 36.0 g/dL   RDW 14.4 11.5 - 15.5 %   Platelets 206 150 - 400 K/uL   Neutrophils Relative % 79 %   Neutro Abs 7.6 1.7 - 7.7 K/uL   Lymphocytes Relative 11 %   Lymphs Abs 1.1 0.7 - 4.0 K/uL   Monocytes Relative 8 %   Monocytes Absolute 0.8 0.1 - 1.0 K/uL   Eosinophils Relative 2 %   Eosinophils Absolute 0.2 0.0 - 0.7 K/uL   Basophils Relative  0 %   Basophils Absolute 0.0 0.0 - 0.1 K/uL    Dg Chest 1 View Result  Date: 02/02/2017 CLINICAL DATA:  Fall today injuring left hip and striking head. Left hip fracture. Initial encounter. EXAM: CHEST 1 VIEW COMPARISON:  Radiograph 01/24/2016 FINDINGS: Low lung volumes. Cardiomegaly with tortuous atherosclerotic thoracic aorta. No pulmonary edema. No pneumothorax or confluent airspace disease. Mild bibasilar atelectasis. No pleural effusion. Degenerative change in both shoulders. IMPRESSION: 1. Low lung volumes without acute abnormality. 2. Chronic cardiomegaly with tortuous atherosclerotic aorta. Electronically Signed   By: Jeb Levering M.D.   On: 02/02/2017 22:50   Ct Head Wo Contrast Result Date: 02/02/2017 CLINICAL DATA:  83 y/o  M; status post fall with head injury. EXAM: CT HEAD WITHOUT CONTRAST TECHNIQUE: Contiguous axial images were obtained from the base of the skull through the vertex without intravenous contrast. COMPARISON:  01/01/2008 CT head FINDINGS: Brain: No evidence of acute infarction, hemorrhage, hydrocephalus, extra-axial collection or mass lesion/mass effect. Scattered foci of hypoattenuation in subcortical periventricular white matter compatible moderate chronic microvascular ischemic changes for age and progressed from 2009. Mild brain parenchymal volume loss. Vascular: Calcific atherosclerosis of carotid siphons. No hyperdense vessel identified. Skull: Normal. Negative for fracture or focal lesion. Sinuses/Orbits: Aerosolized secretions within left posterior ethmoid air cells. Otherwise negative. Bilateral intra-ocular lens replacement. Other: None. IMPRESSION: 1. No acute intracranial abnormality or displaced calvarial fracture. 2. Moderate for age chronic microvascular ischemic changes and mild parenchymal volume loss of the brain with progression from 2009. Electronically Signed   By: Kristine Garbe M.D.   On: 02/02/2017 22:53   Dg Hip Unilat With Pelvis 2-3 Views Left Result Date: 02/02/2017 CLINICAL DATA:  Lost balance and fell today  injuring left hip. Left hip pain. Initial encounter. EXAM: DG HIP (WITH OR WITHOUT PELVIS) 2-3V LEFT COMPARISON:  Pelvis radiograph 01/24/2016 FINDINGS: Displaced comminuted proximal femur fracture with displacement involving the lesser trochanter. Oblique fracture involves the subtrochanteric proximal femoral shaft. There is minimal apex lateral angulation. Femoral head remains seated with osteoarthritis. Pubic rami are intact. Pubic symphysis is congruent. IMPRESSION: Comminuted displaced proximal femur fracture with displacement of a lesser trochanter as well as displaced subtrochanteric involvement. Electronically Signed   By: Jeb Levering M.D.   On: 02/02/2017 22:52     2320:  T/C to Ortho Dr. Aline Brochure, case discussed, including:  HPI, pertinent PM/SHx, VS/PE, dx testing, ED course and treatment: he has viewed the XR, due to pt's underlying chronic medical conditions, pt will need transfer [facility] to [facility] for Ortho repair.   2335:  T/C to [facility] Dr. Rolena Infante, case discussed, including:  HPI, pertinent PM/SHx, VS/PE, dx testing, ED course and treatment:  Refuses to accept pt.   0005:  T/C to [facility] Dr. Thad Ranger, case discussed, including:  HPI, pertinent PM/SHx, VS/PE, dx testing, ED course and treatment:  Agreeable to accept transfer to ED for admit. Dx and testing d/w pt and family.  Questions answered.  Verb understanding, agreeable to transfer/admit to [facility].        Final Clinical Impressions(s) / ED Diagnoses   Final diagnoses:  None    New Prescriptions New Prescriptions   No medications on file     Francine Graven, DO 02/04/17 1722

## 2017-02-03 ENCOUNTER — Other Ambulatory Visit: Payer: PPO

## 2017-02-03 DIAGNOSIS — R339 Retention of urine, unspecified: Secondary | ICD-10-CM | POA: Diagnosis not present

## 2017-02-03 DIAGNOSIS — M898X1 Other specified disorders of bone, shoulder: Secondary | ICD-10-CM | POA: Diagnosis not present

## 2017-02-03 DIAGNOSIS — D509 Iron deficiency anemia, unspecified: Secondary | ICD-10-CM | POA: Diagnosis not present

## 2017-02-03 DIAGNOSIS — E785 Hyperlipidemia, unspecified: Secondary | ICD-10-CM | POA: Diagnosis not present

## 2017-02-03 DIAGNOSIS — M19072 Primary osteoarthritis, left ankle and foot: Secondary | ICD-10-CM | POA: Diagnosis not present

## 2017-02-03 DIAGNOSIS — Q231 Congenital insufficiency of aortic valve: Secondary | ICD-10-CM | POA: Diagnosis not present

## 2017-02-03 DIAGNOSIS — Y929 Unspecified place or not applicable: Secondary | ICD-10-CM | POA: Diagnosis not present

## 2017-02-03 DIAGNOSIS — M25462 Effusion, left knee: Secondary | ICD-10-CM | POA: Diagnosis not present

## 2017-02-03 DIAGNOSIS — I350 Nonrheumatic aortic (valve) stenosis: Secondary | ICD-10-CM | POA: Diagnosis not present

## 2017-02-03 DIAGNOSIS — I129 Hypertensive chronic kidney disease with stage 1 through stage 4 chronic kidney disease, or unspecified chronic kidney disease: Secondary | ICD-10-CM | POA: Diagnosis not present

## 2017-02-03 DIAGNOSIS — E1165 Type 2 diabetes mellitus with hyperglycemia: Secondary | ICD-10-CM | POA: Diagnosis not present

## 2017-02-03 DIAGNOSIS — Y939 Activity, unspecified: Secondary | ICD-10-CM | POA: Diagnosis not present

## 2017-02-03 DIAGNOSIS — I2510 Atherosclerotic heart disease of native coronary artery without angina pectoris: Secondary | ICD-10-CM | POA: Diagnosis not present

## 2017-02-03 DIAGNOSIS — R9431 Abnormal electrocardiogram [ECG] [EKG]: Secondary | ICD-10-CM | POA: Diagnosis not present

## 2017-02-03 DIAGNOSIS — S7292XA Unspecified fracture of left femur, initial encounter for closed fracture: Secondary | ICD-10-CM | POA: Diagnosis not present

## 2017-02-03 DIAGNOSIS — W19XXXA Unspecified fall, initial encounter: Secondary | ICD-10-CM | POA: Diagnosis not present

## 2017-02-03 DIAGNOSIS — R262 Difficulty in walking, not elsewhere classified: Secondary | ICD-10-CM | POA: Diagnosis not present

## 2017-02-03 DIAGNOSIS — W1789XA Other fall from one level to another, initial encounter: Secondary | ICD-10-CM | POA: Diagnosis not present

## 2017-02-03 DIAGNOSIS — M25552 Pain in left hip: Secondary | ICD-10-CM | POA: Diagnosis not present

## 2017-02-03 DIAGNOSIS — S72402D Unspecified fracture of lower end of left femur, subsequent encounter for closed fracture with routine healing: Secondary | ICD-10-CM | POA: Diagnosis not present

## 2017-02-03 DIAGNOSIS — I998 Other disorder of circulatory system: Secondary | ICD-10-CM | POA: Diagnosis not present

## 2017-02-03 DIAGNOSIS — N183 Chronic kidney disease, stage 3 (moderate): Secondary | ICD-10-CM | POA: Diagnosis not present

## 2017-02-03 DIAGNOSIS — I700 Atherosclerosis of aorta: Secondary | ICD-10-CM | POA: Diagnosis not present

## 2017-02-03 DIAGNOSIS — Z7982 Long term (current) use of aspirin: Secondary | ICD-10-CM | POA: Diagnosis not present

## 2017-02-03 DIAGNOSIS — Q230 Congenital stenosis of aortic valve: Secondary | ICD-10-CM | POA: Diagnosis not present

## 2017-02-03 DIAGNOSIS — I739 Peripheral vascular disease, unspecified: Secondary | ICD-10-CM | POA: Diagnosis not present

## 2017-02-03 DIAGNOSIS — S72122D Displaced fracture of lesser trochanter of left femur, subsequent encounter for closed fracture with routine healing: Secondary | ICD-10-CM | POA: Diagnosis not present

## 2017-02-03 DIAGNOSIS — R279 Unspecified lack of coordination: Secondary | ICD-10-CM | POA: Diagnosis not present

## 2017-02-03 DIAGNOSIS — K219 Gastro-esophageal reflux disease without esophagitis: Secondary | ICD-10-CM | POA: Diagnosis not present

## 2017-02-03 DIAGNOSIS — S0990XA Unspecified injury of head, initial encounter: Secondary | ICD-10-CM | POA: Diagnosis not present

## 2017-02-03 DIAGNOSIS — M6281 Muscle weakness (generalized): Secondary | ICD-10-CM | POA: Diagnosis not present

## 2017-02-03 DIAGNOSIS — M8580 Other specified disorders of bone density and structure, unspecified site: Secondary | ICD-10-CM | POA: Diagnosis not present

## 2017-02-03 DIAGNOSIS — I10 Essential (primary) hypertension: Secondary | ICD-10-CM | POA: Diagnosis not present

## 2017-02-03 DIAGNOSIS — M1612 Unilateral primary osteoarthritis, left hip: Secondary | ICD-10-CM | POA: Diagnosis not present

## 2017-02-03 DIAGNOSIS — S7222XD Displaced subtrochanteric fracture of left femur, subsequent encounter for closed fracture with routine healing: Secondary | ICD-10-CM | POA: Diagnosis not present

## 2017-02-03 DIAGNOSIS — Z01818 Encounter for other preprocedural examination: Secondary | ICD-10-CM | POA: Diagnosis not present

## 2017-02-03 DIAGNOSIS — E119 Type 2 diabetes mellitus without complications: Secondary | ICD-10-CM | POA: Diagnosis not present

## 2017-02-03 DIAGNOSIS — I679 Cerebrovascular disease, unspecified: Secondary | ICD-10-CM | POA: Diagnosis not present

## 2017-02-03 DIAGNOSIS — Z8673 Personal history of transient ischemic attack (TIA), and cerebral infarction without residual deficits: Secondary | ICD-10-CM | POA: Diagnosis not present

## 2017-02-03 DIAGNOSIS — F329 Major depressive disorder, single episode, unspecified: Secondary | ICD-10-CM | POA: Diagnosis not present

## 2017-02-03 DIAGNOSIS — N179 Acute kidney failure, unspecified: Secondary | ICD-10-CM | POA: Diagnosis not present

## 2017-02-03 DIAGNOSIS — Y999 Unspecified external cause status: Secondary | ICD-10-CM | POA: Diagnosis not present

## 2017-02-03 DIAGNOSIS — E1122 Type 2 diabetes mellitus with diabetic chronic kidney disease: Secondary | ICD-10-CM | POA: Diagnosis not present

## 2017-02-03 DIAGNOSIS — Z87891 Personal history of nicotine dependence: Secondary | ICD-10-CM | POA: Diagnosis not present

## 2017-02-03 DIAGNOSIS — M2342 Loose body in knee, left knee: Secondary | ICD-10-CM | POA: Diagnosis not present

## 2017-02-03 DIAGNOSIS — R600 Localized edema: Secondary | ICD-10-CM | POA: Diagnosis not present

## 2017-02-03 DIAGNOSIS — J449 Chronic obstructive pulmonary disease, unspecified: Secondary | ICD-10-CM | POA: Diagnosis not present

## 2017-02-03 DIAGNOSIS — S7222XA Displaced subtrochanteric fracture of left femur, initial encounter for closed fracture: Secondary | ICD-10-CM | POA: Diagnosis not present

## 2017-02-03 DIAGNOSIS — Z7409 Other reduced mobility: Secondary | ICD-10-CM | POA: Diagnosis not present

## 2017-02-03 DIAGNOSIS — W01198A Fall on same level from slipping, tripping and stumbling with subsequent striking against other object, initial encounter: Secondary | ICD-10-CM | POA: Diagnosis not present

## 2017-02-03 DIAGNOSIS — Z7984 Long term (current) use of oral hypoglycemic drugs: Secondary | ICD-10-CM | POA: Diagnosis not present

## 2017-02-03 DIAGNOSIS — D62 Acute posthemorrhagic anemia: Secondary | ICD-10-CM | POA: Diagnosis not present

## 2017-02-03 DIAGNOSIS — F17200 Nicotine dependence, unspecified, uncomplicated: Secondary | ICD-10-CM | POA: Diagnosis not present

## 2017-02-03 DIAGNOSIS — Z9181 History of falling: Secondary | ICD-10-CM | POA: Diagnosis not present

## 2017-02-03 DIAGNOSIS — S79912A Unspecified injury of left hip, initial encounter: Secondary | ICD-10-CM | POA: Diagnosis present

## 2017-02-03 DIAGNOSIS — Z79899 Other long term (current) drug therapy: Secondary | ICD-10-CM | POA: Diagnosis not present

## 2017-02-03 DIAGNOSIS — M1712 Unilateral primary osteoarthritis, left knee: Secondary | ICD-10-CM | POA: Diagnosis not present

## 2017-02-03 LAB — BASIC METABOLIC PANEL
ANION GAP: 10 (ref 5–15)
BUN: 25 mg/dL — ABNORMAL HIGH (ref 6–20)
CHLORIDE: 106 mmol/L (ref 101–111)
CO2: 26 mmol/L (ref 22–32)
Calcium: 9.1 mg/dL (ref 8.9–10.3)
Creatinine, Ser: 1.58 mg/dL — ABNORMAL HIGH (ref 0.61–1.24)
GFR calc Af Amer: 44 mL/min — ABNORMAL LOW (ref >60)
GFR calc non Af Amer: 38 mL/min — ABNORMAL LOW (ref >60)
GLUCOSE: 187 mg/dL — AB (ref 65–99)
POTASSIUM: 3.3 mmol/L — AB (ref 3.5–5.1)
Sodium: 142 mmol/L (ref 135–145)

## 2017-02-03 LAB — TROPONIN I: Troponin I: <0.03 ng/mL (ref <0.03)

## 2017-02-03 NOTE — ED Notes (Signed)
Report to [facility] with [facility]-advised computers are down and EMTALA is not within 1 hour of pt transfer, ok per [facility]

## 2017-02-03 NOTE — ED Notes (Signed)
Report given to Shanon Brow, RN with [facility] ED

## 2017-02-11 ENCOUNTER — Inpatient Hospital Stay
Admission: RE | Admit: 2017-02-11 | Discharge: 2017-03-03 | Disposition: A | Discharge status: Home / Self Care | Payer: PPO | Source: Ambulatory Visit | Attending: Internal Medicine | Admitting: Internal Medicine

## 2017-02-11 ENCOUNTER — Non-Acute Institutional Stay (SKILLED_NURSING_FACILITY): Payer: PPO | Admitting: Internal Medicine

## 2017-02-11 DIAGNOSIS — S7222XS Displaced subtrochanteric fracture of left femur, sequela: Secondary | ICD-10-CM | POA: Diagnosis not present

## 2017-02-11 DIAGNOSIS — I350 Nonrheumatic aortic (valve) stenosis: Secondary | ICD-10-CM | POA: Diagnosis not present

## 2017-02-11 DIAGNOSIS — S72122D Displaced fracture of lesser trochanter of left femur, subsequent encounter for closed fracture with routine healing: Secondary | ICD-10-CM | POA: Diagnosis not present

## 2017-02-11 DIAGNOSIS — I129 Hypertensive chronic kidney disease with stage 1 through stage 4 chronic kidney disease, or unspecified chronic kidney disease: Secondary | ICD-10-CM | POA: Diagnosis not present

## 2017-02-11 DIAGNOSIS — M84459A Pathological fracture, hip, unspecified, initial encounter for fracture: Secondary | ICD-10-CM | POA: Diagnosis not present

## 2017-02-11 DIAGNOSIS — N189 Chronic kidney disease, unspecified: Secondary | ICD-10-CM | POA: Diagnosis not present

## 2017-02-11 DIAGNOSIS — N183 Chronic kidney disease, stage 3 unspecified: Secondary | ICD-10-CM

## 2017-02-11 DIAGNOSIS — D649 Anemia, unspecified: Secondary | ICD-10-CM | POA: Diagnosis not present

## 2017-02-11 DIAGNOSIS — S72012D Unspecified intracapsular fracture of left femur, subsequent encounter for closed fracture with routine healing: Secondary | ICD-10-CM | POA: Diagnosis not present

## 2017-02-11 DIAGNOSIS — R600 Localized edema: Secondary | ICD-10-CM | POA: Diagnosis not present

## 2017-02-11 DIAGNOSIS — F329 Major depressive disorder, single episode, unspecified: Secondary | ICD-10-CM

## 2017-02-11 DIAGNOSIS — Z9181 History of falling: Secondary | ICD-10-CM | POA: Diagnosis not present

## 2017-02-11 DIAGNOSIS — Q231 Congenital insufficiency of aortic valve: Secondary | ICD-10-CM | POA: Diagnosis not present

## 2017-02-11 DIAGNOSIS — F32A Depression, unspecified: Secondary | ICD-10-CM

## 2017-02-11 DIAGNOSIS — S7222XD Displaced subtrochanteric fracture of left femur, subsequent encounter for closed fracture with routine healing: Secondary | ICD-10-CM | POA: Diagnosis not present

## 2017-02-11 DIAGNOSIS — R262 Difficulty in walking, not elsewhere classified: Secondary | ICD-10-CM | POA: Diagnosis not present

## 2017-02-11 DIAGNOSIS — D509 Iron deficiency anemia, unspecified: Secondary | ICD-10-CM

## 2017-02-11 DIAGNOSIS — I739 Peripheral vascular disease, unspecified: Secondary | ICD-10-CM | POA: Diagnosis not present

## 2017-02-11 DIAGNOSIS — I2510 Atherosclerotic heart disease of native coronary artery without angina pectoris: Secondary | ICD-10-CM | POA: Diagnosis not present

## 2017-02-11 DIAGNOSIS — S72402D Unspecified fracture of lower end of left femur, subsequent encounter for closed fracture with routine healing: Secondary | ICD-10-CM

## 2017-02-11 DIAGNOSIS — E119 Type 2 diabetes mellitus without complications: Secondary | ICD-10-CM | POA: Diagnosis not present

## 2017-02-11 DIAGNOSIS — S7292XA Unspecified fracture of left femur, initial encounter for closed fracture: Secondary | ICD-10-CM | POA: Diagnosis not present

## 2017-02-11 DIAGNOSIS — I10 Essential (primary) hypertension: Secondary | ICD-10-CM | POA: Diagnosis not present

## 2017-02-11 DIAGNOSIS — M6281 Muscle weakness (generalized): Secondary | ICD-10-CM | POA: Diagnosis not present

## 2017-02-11 DIAGNOSIS — J449 Chronic obstructive pulmonary disease, unspecified: Secondary | ICD-10-CM | POA: Diagnosis not present

## 2017-02-11 DIAGNOSIS — R339 Retention of urine, unspecified: Secondary | ICD-10-CM | POA: Diagnosis not present

## 2017-02-11 DIAGNOSIS — I679 Cerebrovascular disease, unspecified: Secondary | ICD-10-CM | POA: Diagnosis not present

## 2017-02-11 DIAGNOSIS — M7989 Other specified soft tissue disorders: Secondary | ICD-10-CM | POA: Diagnosis not present

## 2017-02-11 DIAGNOSIS — E1122 Type 2 diabetes mellitus with diabetic chronic kidney disease: Secondary | ICD-10-CM | POA: Diagnosis not present

## 2017-02-11 DIAGNOSIS — R279 Unspecified lack of coordination: Secondary | ICD-10-CM | POA: Diagnosis not present

## 2017-02-11 DIAGNOSIS — E1151 Type 2 diabetes mellitus with diabetic peripheral angiopathy without gangrene: Secondary | ICD-10-CM | POA: Diagnosis not present

## 2017-02-11 DIAGNOSIS — E785 Hyperlipidemia, unspecified: Secondary | ICD-10-CM | POA: Diagnosis not present

## 2017-02-11 DIAGNOSIS — N289 Disorder of kidney and ureter, unspecified: Secondary | ICD-10-CM | POA: Diagnosis not present

## 2017-02-11 DIAGNOSIS — Z7409 Other reduced mobility: Secondary | ICD-10-CM | POA: Diagnosis not present

## 2017-02-12 ENCOUNTER — Encounter (HOSPITAL_COMMUNITY)
Admission: RE | Admit: 2017-02-12 | Discharge: 2017-02-12 | Disposition: A | Discharge status: Home / Self Care | Payer: PPO | Source: Other Acute Inpatient Hospital | Attending: Internal Medicine | Admitting: Internal Medicine

## 2017-02-12 DIAGNOSIS — N183 Chronic kidney disease, stage 3 (moderate): Secondary | ICD-10-CM | POA: Insufficient documentation

## 2017-02-12 LAB — CBC WITH DIFFERENTIAL/PLATELET
BASOS ABS: 0 10*3/uL (ref 0.0–0.1)
BASOS PCT: 1 %
Eosinophils Absolute: 0.3 10*3/uL (ref 0.0–0.7)
Eosinophils Relative: 4 %
HEMATOCRIT: 32.9 % — AB (ref 39.0–52.0)
HEMOGLOBIN: 10.7 g/dL — AB (ref 13.0–17.0)
LYMPHS PCT: 15 %
Lymphs Abs: 1.1 10*3/uL (ref 0.7–4.0)
MCH: 30.2 pg (ref 26.0–34.0)
MCHC: 32.5 g/dL (ref 30.0–36.0)
MCV: 92.9 fL (ref 78.0–100.0)
Monocytes Absolute: 0.9 10*3/uL (ref 0.1–1.0)
Monocytes Relative: 12 %
NEUTROS ABS: 5.1 10*3/uL (ref 1.7–7.7)
NEUTROS PCT: 68 %
Platelets: 273 10*3/uL (ref 150–400)
RBC: 3.54 MIL/uL — AB (ref 4.22–5.81)
RDW: 14.7 % (ref 11.5–15.5)
WBC: 7.5 10*3/uL (ref 4.0–10.5)

## 2017-02-12 LAB — COMPREHENSIVE METABOLIC PANEL
ALK PHOS: 67 U/L (ref 38–126)
ALT: 43 U/L (ref 17–63)
ANION GAP: 9 (ref 5–15)
AST: 29 U/L (ref 15–41)
Albumin: 3 g/dL — ABNORMAL LOW (ref 3.5–5.0)
BILIRUBIN TOTAL: 1.1 mg/dL (ref 0.3–1.2)
BUN: 28 mg/dL — ABNORMAL HIGH (ref 6–20)
CALCIUM: 8.6 mg/dL — AB (ref 8.9–10.3)
CO2: 25 mmol/L (ref 22–32)
Chloride: 102 mmol/L (ref 101–111)
Creatinine, Ser: 1.38 mg/dL — ABNORMAL HIGH (ref 0.61–1.24)
GFR calc non Af Amer: 44 mL/min — ABNORMAL LOW (ref >60)
GFR, EST AFRICAN AMERICAN: 51 mL/min — AB (ref >60)
Glucose, Bld: 194 mg/dL — ABNORMAL HIGH (ref 65–99)
POTASSIUM: 3.8 mmol/L (ref 3.5–5.1)
Sodium: 136 mmol/L (ref 135–145)
TOTAL PROTEIN: 6.1 g/dL — AB (ref 6.5–8.1)

## 2017-02-13 NOTE — Progress Notes (Signed)
This is an acute visit.  Level care skilled.  Facility is [facility].  Chief complaint acute visit follow-up hospitalization left femur fracture status post repair with forced bed this [facility].  History of present illness.  Patient is a very pleasant 83 year old male who is here for rehabilitation after sustaining a left femur fracture apparently after a fall.  Initially was evaluated at any [facility] because of his multiple medical issues.  He also has a history of hypertension chronic kidney disease stage III hyperlipidemia diabetes type 2 COPD and moderate aortic stenosis.  He tolerated the surgery well he was complicated by development of AK I postop in the setting of diuresis with Lasix.  This resolved.  Was thought his lower extremity edema left greater than right was dependent related.  Ultrasound was negative for DVT.  Regards diabetes his oral medications were discontinued he was continued on Lantus and sliding scale insulin.  It appears she did have postop anemia he is on iron Will update labs tomorrow I suspect his hemoglobin will be down from the 14 he initially presented with status post surgery.  He is on aspirin twice a day for 6 weeks for DVT prophylaxis.  He also apparently had urinary retention in the hospital and a catheter was placed there is recommendation for follow-up with urology at [facility] and  Currently he has no complaints she is resting in bed comfortably family is at bedside.  Regards to his other medical issues and chronic kidney disease last creatinine was 1.56 in her records this was done on August 7 and we will update this as well.  Past Medical History:  Diagnosis Date  . Aortic valve stenosis   . ASCVD (arteriosclerotic cardiovascular disease)    50% ostial left left anterior desending 60% mid circumflex and normal ejection fractioon in 2006  exertional dyspnea;history of chest discomfort  . Cerebrovascular disease    minimal carotid bruits  . CKD (chronic kidney disease)   . Diabetes mellitus type 2, controlled ([place])    no insulin  . Hyperlipidemia    statin intolerant  . Hypertension   . Tobacco abuse, in remission    30 pack years stopped in 1971        Patient Active Problem List   Diagnosis Date Noted  . Sepsis ([place]) 01/25/2016  . UTI (lower urinary tract infection) 01/24/2016  . Aortic stenosis 10/19/2012  . CKD (chronic kidney disease), stage III 10/10/2011  . Peripheral vascular disease ([place]) 10/10/2011  . Tobacco abuse, in remission   . Diabetes mellitus type 2, controlled ([place])   . Arteriosclerotic cardiovascular disease (ASCVD) 08/28/2009  . CEREBROVASCULAR DISEASE 08/28/2009  . HYPERLIPIDEMIA 12/09/2008  . Essential hypertension 12/09/2008         Past Surgical History:  Procedure Laterality Date  . APPENDECTOMY    . CARPAL TUNNEL RELEASE     surgery left 05/05/2001  . KNEE ARTHROSCOPY     bilaterial; unknown associated surgical procedure  . SHOULDER SURGERY     Right x2; third procedure on left  . TONSILLECTOMY    . TRANSURETHRAL INCISION OF PROSTATE     resection of the prostate     Medications.  Aspirin 81 mg twice a day 6 weeks.  Vitamin C 500 mg 3 times a day.  Calcium citrate 200 mg daily.  Vitamin D3 1000 units daily.  Coreg 12.5 mg twice a day.  Flomax 0.4 mg daily.  Lantus 10 units every morning.  Norco 5-3 25 mg when necessary every 6 hours.  2 tabs when necessary every 6 hours for severe pain.   Iron 150 mg daily.  Pilocarpine-2% drops 1 drop into left eye daily.  Protonix 40 mg daily.  Proventil inhaler 2 puffs every 4 hours when necessary.  Rhopressa eyedrops 1 drop left eye daily.  Senokot-S twice a day.  Symbicort inhaler every 12 hours.  Latanoprost eyedrops 0.005% 1 drop both eyes daily.  Zofran 4 mg every 6  hours when necessary nausea or vomiting.  Zoloft 25 mg daily.   Review of systems.  In general is not complaining of any fever or chills.  Skin does not complain of rashes or itching he does have bruising more in his peroneal inner thigh area surgical site is currently covered.  Head ears eyes nose mouth and throat does not complaining of any visual changes or sore throat.  Respiratory denies shortness of breath or cough.  Cardiac does not complaining of any chest pain does have lower extremity edema left greater than right again Doppler was negative in the hospital for DVT.  GI does not complaining of nausea vomiting diarrhea constipation or abdominal discomfort at this time.  GU does have an indwelling Foley catheter with apparently some history of urinary retention in the hospital.  Does not complain of dysuria.  Muscle skeletal he says at this time hip discomfort is controlled apparently with movement there is still some pain.  Neurologic is not complaining of dizziness headache or numbness.  Psych does not complain of overt anxiety or depression.   Physical exam.  Temperature is 98.1 pulse 72 respirations 20 blood pressure 110/47  In general this is a pleasant elderly male in no distress resting comfortably in bed.  His skin is warm and dry he does have violaceous bruising of his scrotal area as well as inner thigh area surgical site left hip is currently covered.  Eyes pupils appear reactive light sclera and conjunctiva are clear visual acuity appears grossly intact.  Oropharynx is clear mucous membranes moist she is largely edentulous.  Chest is clear to auscultation with shallow air entry there is no labored breathing.  Heart is regular rate and rhythm with occasional irregular beat he has left greater than right lower extremity edema pedal pulses are intact bilaterally per family the edema has improved since his hospitalization.  Abdomen is soft nontender with  positive bowel sounds.  GU he does have a Foley catheter in place draining amber colored urine again he does have some bruising of his perineal area I do not note any scrotal swelling however or acute pain.  Muscle skeletal does have limited mobility of his left leg status post surgery versus other extremities appears at baseline limited exam since he is in bed.  Neurologic is grossly intact to speech is clear no lateralizing findings.  Psych he is alert and oriented pleasant and appropriate  Labs.  02/02/2017.  WBC 9.6 hemoglobin 14.0 platelets 206.  01/25/2017.  Sodium 136 potassium 3.2 BUN 20 creatinine 1.56.  01/25/2016.  Hemoglobin A1c 6.7.  Assessment and plan.  #1-history of left hip fracture with repair-she appears to have tolerated surgery well he will need PT and OT-currently receives Norco as needed for pain he says pain is controlled although he does have pain with movement with would not be unexpected at this point will monitor he is on aspirin 81 mg twice a day  for 6 weeks for prophylaxis.  He is also on calcium supplementation as well as vitamin D  #2 history of urinary retention Foley catheter was placed at Hospital-recommendation for urology follow-up at [facility] in approximately 2 weeks.--He continues on Flomax  #3-history diabetes type 2 we'll monitor CBGs hemoglobin A1c was 6.7 in the hospital his oral medications were discontinued in the hospital he continues on Lantus with sliding scale.  #4-history of chronic kidney disease Will update this last creatinine we have on record is 1.56 BUN of 20 back on August 7 that was before he was transferred to [facility] will update this.  #5 history of hypokalemia is potassium was 3.2 back on August  seventh I suspect this was repleted during his hospitalization but will check this as well.  No 6-hypertension he continues on Coreg will monitor blood pressures apparently this was not an issue during  hospitalization.  #7 history of glaucoma he is on numerous topical eyedrops apparently family will be bringing in drops as well that he was taken at home-suspected just was will be made once these medications arrive.  #8-anemia most likely postop he has been started on iron again will update a hemoglobin hemoglobin 14 was 14.0.  #9 history depression he is on Zoloft at this point appears to be well controlled.   Again will update a CBC and metabolic panel tomorrow also will need urology follow-up-and monitoring of his blood sugars and blood pressure.  [id number] note greater than 45 minutes spent assessing patient-reviewing his chart-reviewing his labs-discussing his status with nursing staff-and coordinating and formulating a plan of care for numerous diagnoses-of note greater than 50% of time spent coordinating plan of care

## 2017-02-14 ENCOUNTER — Encounter: Payer: Self-pay | Admitting: Internal Medicine

## 2017-02-14 ENCOUNTER — Non-Acute Institutional Stay (SKILLED_NURSING_FACILITY): Payer: PPO | Admitting: Internal Medicine

## 2017-02-14 DIAGNOSIS — I679 Cerebrovascular disease, unspecified: Secondary | ICD-10-CM | POA: Diagnosis not present

## 2017-02-14 DIAGNOSIS — N183 Chronic kidney disease, stage 3 unspecified: Secondary | ICD-10-CM

## 2017-02-14 DIAGNOSIS — E119 Type 2 diabetes mellitus without complications: Secondary | ICD-10-CM | POA: Diagnosis not present

## 2017-02-14 DIAGNOSIS — I10 Essential (primary) hypertension: Secondary | ICD-10-CM | POA: Diagnosis not present

## 2017-02-14 DIAGNOSIS — R339 Retention of urine, unspecified: Secondary | ICD-10-CM | POA: Diagnosis not present

## 2017-02-14 DIAGNOSIS — S7222XS Displaced subtrochanteric fracture of left femur, sequela: Secondary | ICD-10-CM

## 2017-02-14 DIAGNOSIS — I350 Nonrheumatic aortic (valve) stenosis: Secondary | ICD-10-CM | POA: Diagnosis not present

## 2017-02-14 NOTE — Progress Notes (Signed)
Provider:  Veleta Miners Location:    [place] Room Number: 133/P Place of Service:  SNF (31)  PCP: [name], MD Patient Care Team: [name], MD as PCP - General (Internal Medicine) Clent Jacks, MD (Ophthalmology) Harl Bowie Alphonse Guild, MD as Consulting Physician (Cardiology)  Extended Emergency Contact Information Primary Emergency Contact: [facility] Address: [address], [place] [address] [place] Phone: [phone] Relation: Daughter Secondary Emergency Contact: Arlyss Queen [place] Phone: [phone] Mobile Phone: [phone] Relation: Daughter  Code Status:  Full Code Goals of Care: Advanced Directive information Advanced Directives 02/14/2017  Does Patient Have a Medical Advance Directive? Yes  Type of Advance Directive (No Data)  Does patient want to make changes to medical advance directive? No - Patient declined  Would patient like information on creating a medical advance directive? -      Chief Complaint  Patient presents with  . New Admit To SNF    New Admission,     HPI: Patient is a 83 y.o. male seen today for admission to SNF for therapy after ORIF of Left hip.  Patient has h/o Moderate AS with EF of 55-60%, COPD, CAD, Hyperlipidemia, Carotid stenosis, hypertension and Chronic kidney disease Diabetes Mellitus type2  He fell in his house and sustained left Subtrochanteric Fracture of femur. He underwent Intramedullary Nail on 08/17 Post op course was uncomplicated. Only he had difficulty viding and Foley catheter was placed. Twice it was tried to remove but patient failed to void himself and was send to SNF  With Foley.  His Oral hypoglycemics were stopped in the hospital and he was started on Lantus. Patient says his pain is controlled only needs something before therapy. He also wants to restart his oral Hypoglycemics He also says that he was having problem  with Urinary Incontinence at home and has seen Urologist for that. He lives with his wife and has daughter around who helps. He is independent in ADL and IADL.  Past Medical History:  Diagnosis Date  . Aortic valve stenosis   . ASCVD (arteriosclerotic cardiovascular disease)    50% ostial left left anterior desending 60% mid circumflex and normal ejection fractioon in 2006 exertional dyspnea;history of chest discomfort  . Cerebrovascular disease    minimal carotid bruits  . CKD (chronic kidney disease)   . Diabetes mellitus type 2, controlled ([place])    no insulin  . Hyperlipidemia    statin intolerant  . Hypertension   . Tobacco abuse, in remission    30 pack years stopped in 1971   Past Surgical History:  Procedure Laterality Date  . APPENDECTOMY    . CARPAL TUNNEL RELEASE     surgery left 05/05/2001  . KNEE ARTHROSCOPY     bilaterial; unknown associated surgical procedure  . SHOULDER SURGERY     Right x2; third procedure on left  . TONSILLECTOMY    . TRANSURETHRAL INCISION OF PROSTATE     resection of the prostate    reports that he quit smoking about 47 years ago. His smoking use included Cigarettes. He has a 40.00 pack-year smoking history. He has never used smokeless tobacco. He reports that he does not drink alcohol or use drugs. Social History   Social History  . Marital status: Married    Spouse name: N/A  . Number of children: 4  . Years of education: N/A  Occupational History  . retired Retired    Systems developer   Social History Main Topics  . Smoking status: Former Smoker    Packs/day: 1.00    Years: 40.00    Types: Cigarettes    Quit date: 10/29/1969  . Smokeless tobacco: Never Used  . Alcohol use No  . Drug use: No  . Sexual activity: Not on file   Other Topics Concern  . Not on file   Social History Narrative  . No narrative on file    Functional Status Survey:    History reviewed. No pertinent family history.  Health Maintenance  Topic  Date Due  . HEMOGLOBIN A1C  02/17/2017 (Originally 07/27/2016)  . URINE MICROALBUMIN  02/17/2017 (Originally 08/26/1937)  . FOOT EXAM  03/17/2017 (Originally 08/26/1937)  . OPHTHALMOLOGY EXAM  03/17/2017 (Originally 08/26/1937)  . TETANUS/TDAP  05/21/2017 (Originally 08/25/1946)  . PNA vac Low Risk Adult (1 of 2 - PCV13) 05/21/2017 (Originally 08/26/1994)  . INFLUENZA VACCINE  Completed    Allergies  Allergen Reactions  . Codeine   . Ezetimibe-Simvastatin     REACTION: myalgias  . Naproxen     REACTION: and all nonsteroidals  . Niacin   . Pravastatin Sodium     REACTION: myalgias no    Outpatient Encounter Prescriptions as of 02/14/2017  Medication Sig  . albuterol (PROVENTIL HFA;VENTOLIN HFA) 108 (90 Base) MCG/ACT inhaler Inhale 2 puffs into the lungs every 4 (four) hours as needed for wheezing or shortness of breath.   Marland Kitchen aspirin 81 MG tablet Take 81 mg by mouth 2 (two) times daily.   . betaxolol (BETOPTIC-S) 0.5 % ophthalmic suspension Place 1 drop into both eyes 2 (two) times daily.   . bimatoprost (LUMIGAN) 0.01 % SOLN Place 1 drop into both eyes daily.  . budesonide-formoterol (SYMBICORT) 160-4.5 MCG/ACT inhaler Inhale 2 puffs into the lungs 2 (two) times daily.  . calcium citrate (CALCITRATE - DOSED IN MG ELEMENTAL CALCIUM) 950 MG tablet Take 200 mg of elemental calcium by mouth daily.  . carvedilol (COREG) 12.5 MG tablet Take 12.5 mg by mouth 2 (two) times daily with a meal.   . Cholecalciferol 1000 units tablet Take 1,000 Units by mouth daily.  . dorzolamide (TRUSOPT) 2 % ophthalmic solution Place 1 drop into the left eye 2 (two) times daily.    Marland Kitchen HYDROcodone-acetaminophen (NORCO/VICODIN) 5-325 MG tablet Take 1 tablet by mouth every 6 (six) hours as needed for moderate pain.  Marland Kitchen HYDROcodone-acetaminophen (NORCO/VICODIN) 5-325 MG tablet Take 2 tablets by mouth every 6 (six) hours as needed for moderate pain.  . Insulin Glargine (LANTUS SOLOSTAR) 100 UNIT/ML Solostar Pen Inject 10 Units  into the skin daily at 10 pm.  . insulin lispro (HUMALOG) 100 UNIT/ML cartridge 3 (three) times daily with meals. Per sliding scale  . iron polysaccharides (NIFEREX) 150 MG capsule Take 150 mg by mouth daily.  . ondansetron (ZOFRAN) 4 MG tablet Take 4 mg by mouth every 6 (six) hours as needed for nausea or vomiting.  . pantoprazole (PROTONIX) 40 MG tablet Take 40 mg by mouth daily.  . pilocarpine (PILOCAR) 2 % ophthalmic solution Place 1 drop into the left eye 4 (four) times daily.   . sennosides-docusate sodium (SENOKOT-S) 8.6-50 MG tablet Take 2 tablets by mouth 2 (two) times daily.  . sertraline (ZOLOFT) 25 MG tablet Take 25 mg by mouth daily.  . tamsulosin (FLOMAX) 0.4 MG CAPS capsule Take 0.4 mg by mouth daily.  . vitamin C (ASCORBIC ACID)  500 MG tablet Take 500 mg by mouth 3 (three) times daily.   . [DISCONTINUED] azithromycin (ZITHROMAX) 250 MG tablet TAKE 2 TABLETS DAY 1 THEN 1 TABLET DAILY FOR 4 DAYS  . [DISCONTINUED] GLIPIZIDE XL 5 MG 24 hr tablet Take 1 tablet by mouth 2 (two) times daily.   . [DISCONTINUED] losartan-hydrochlorothiazide (HYZAAR) 100-25 MG tablet TAKE ONE TABLET BY MOUTH ONCE DAILY.  . [DISCONTINUED] Netarsudil Dimesylate (RHOPRESSA) 0.02 % SOLN Place 1 drop into the left eye at bedtime.  . [DISCONTINUED] omeprazole (PRILOSEC) 20 MG capsule Take 20 mg by mouth daily.    . [DISCONTINUED] predniSONE (DELTASONE) 20 MG tablet Take 2 tablets (40 mg total) by mouth daily with breakfast.  . [DISCONTINUED] sitaGLIPtin (JANUVIA) 50 MG tablet Take 50 mg by mouth daily.  . [DISCONTINUED] umeclidinium-vilanterol (ANORO ELLIPTA) 62.5-25 MCG/INH AEPB Inhale 1 puff into the lungs daily.   No facility-administered encounter medications on file as of 02/14/2017.      Review of Systems  Review of Systems  Constitutional: Negative for activity change, appetite change, chills, diaphoresis, fatigue and fever.  HENT: Negative for mouth sores, postnasal drip, rhinorrhea, sinus pain and  sore throat.   Respiratory: Negative for apnea, cough, chest tightness, shortness of breath and wheezing.   Cardiovascular: Negative for chest pain, palpitations and leg swelling.  Gastrointestinal: Negative for abdominal distention, abdominal pain, constipation, diarrhea, nausea and vomiting.  Genitourinary: Negative for dysuria and positive for  frequency.  Musculoskeletal: Negative for arthralgias, joint swelling and myalgias.  Skin: Negative for rash.  Neurological: Negative for dizziness, syncope, weakness, light-headedness and numbness.  Psychiatric/Behavioral: Negative for behavioral problems, confusion and sleep disturbance.     Vitals:   02/14/17 1630  BP: 124/69  Pulse: 77  Resp: 20  Temp: 98.2 F (36.8 C)  SpO2: 97%   There is no height or weight on file to calculate BMI. Physical Exam  Constitutional: He is oriented to person, place, and time. He appears well-developed and well-nourished.  HENT:  Head: Normocephalic.  Mouth/Throat: Oropharynx is clear and moist.  Eyes: Pupils are equal, round, and reactive to light.  Neck: Neck supple.  Cardiovascular: Normal rate and regular rhythm.   Murmur heard. Pulmonary/Chest: Effort normal and breath sounds normal. No respiratory distress. He has no wheezes. He has no rales.  Abdominal: Soft. Bowel sounds are normal. He exhibits no distension. There is no tenderness. There is no rebound.  Musculoskeletal: He exhibits edema.  Left more then Right  Neurological: He is alert and oriented to person, place, and time.  No focal deficit  Skin: Skin is warm and dry.  Some healing blisters on Lower left leg  Psychiatric: He has a normal mood and affect. His behavior is normal. Judgment and thought content normal.    Labs reviewed: Basic Metabolic Panel:  Recent Labs  02/02/17 2345 02/12/17 0830  NA 142 136  K 3.3* 3.8  CL 106 102  CO2 26 25  GLUCOSE 187* 194*  BUN 25* 28*  CREATININE 1.58* 1.38*  CALCIUM 9.1 8.6*    Liver Function Tests:  Recent Labs  02/12/17 0830  AST 29  ALT 43  ALKPHOS 67  BILITOT 1.1  PROT 6.1*  ALBUMIN 3.0*   No results for input(s): LIPASE, AMYLASE in the last 8760 hours. No results for input(s): AMMONIA in the last 8760 hours. CBC:  Recent Labs  02/02/17 2345 02/12/17 0830  WBC 9.6 7.5  NEUTROABS 7.6 5.1  HGB 14.0 10.7*  HCT 41.4 32.9*  MCV 91.4 92.9  PLT 206 273   Cardiac Enzymes:  Recent Labs  02/02/17 2345  TROPONINI <0.03   BNP: Invalid input(s): POCBNP Lab Results  Component Value Date   HGBA1C 6.7 (H) 01/25/2016   Lab Results  Component Value Date   TSH 3.571 11/09/2012   No results found for: VITAMINB12 No results found for: FOLATE No results found for: IRON, TIBC, FERRITIN  Imaging and Procedures obtained prior to SNF admission: No results found.  Assessment/Plan  Closed displaced subtrochanteric fracture of left femur S/P ORIF Patient doing well. D/W daughters and the Patient in the room will try to give him Norco 30 min before therapy to help with Pain control. He is on aspirin BID for DVT prophylaxis. Follow up with Ortho LE edema Patient did have US done in the hospital and it was negative.  Urinary retention Failed voiding twice in the hospital Patient  Is on Flomax. He has appointment to follow with Urology in [facility]. He has seen [facility] before and will see if can make appointment with them Continue Flomax and Foley for now.  Diabetes mellitus type 2 Patient was taken off his oral medicines in the hospital due to surgery Will restart with Januvia and Glipizide DisContinue Lantus till now. Check A1C Continue Accu check.  CKD (chronic kidney disease), stage III Creat near Baseline  Essential hypertension Well controlled on coreg  Nonrheumatic aortic valve stenosis Follows with cardiology EF has been stable  Anemia After surgery will repeat Labs On iron    Family/ staff Communication:    Labs/tests ordered: CMP, CBC on Thurs.  Total time spent in this patient care encounter was 45_ minutes; greater than 50% of the visit spent counseling patient, reviewing records , Labs and coordinating care for problems addressed at this encounter.

## 2017-02-17 ENCOUNTER — Encounter (HOSPITAL_COMMUNITY)
Admission: RE | Admit: 2017-02-17 | Discharge: 2017-02-17 | Disposition: A | Discharge status: Home / Self Care | Payer: PPO | Source: Skilled Nursing Facility | Attending: Internal Medicine | Admitting: Internal Medicine

## 2017-02-17 DIAGNOSIS — E119 Type 2 diabetes mellitus without complications: Secondary | ICD-10-CM | POA: Diagnosis not present

## 2017-02-17 DIAGNOSIS — Z4789 Encounter for other orthopedic aftercare: Secondary | ICD-10-CM | POA: Insufficient documentation

## 2017-02-17 DIAGNOSIS — S7222XS Displaced subtrochanteric fracture of left femur, sequela: Secondary | ICD-10-CM | POA: Diagnosis not present

## 2017-02-17 DIAGNOSIS — M7989 Other specified soft tissue disorders: Secondary | ICD-10-CM | POA: Diagnosis not present

## 2017-02-17 DIAGNOSIS — E785 Hyperlipidemia, unspecified: Secondary | ICD-10-CM | POA: Diagnosis not present

## 2017-02-17 DIAGNOSIS — I10 Essential (primary) hypertension: Secondary | ICD-10-CM | POA: Diagnosis not present

## 2017-02-17 DIAGNOSIS — S7222XD Displaced subtrochanteric fracture of left femur, subsequent encounter for closed fracture with routine healing: Secondary | ICD-10-CM | POA: Insufficient documentation

## 2017-02-17 DIAGNOSIS — Z9181 History of falling: Secondary | ICD-10-CM | POA: Insufficient documentation

## 2017-02-17 DIAGNOSIS — S72122D Displaced fracture of lesser trochanter of left femur, subsequent encounter for closed fracture with routine healing: Secondary | ICD-10-CM | POA: Insufficient documentation

## 2017-02-17 LAB — CBC
HCT: 32.7 % — ABNORMAL LOW (ref 39.0–52.0)
Hemoglobin: 10.6 g/dL — ABNORMAL LOW (ref 13.0–17.0)
MCH: 30.2 pg (ref 26.0–34.0)
MCHC: 32.4 g/dL (ref 30.0–36.0)
MCV: 93.2 fL (ref 78.0–100.0)
PLATELETS: 262 10*3/uL (ref 150–400)
RBC: 3.51 MIL/uL — ABNORMAL LOW (ref 4.22–5.81)
RDW: 14.9 % (ref 11.5–15.5)
WBC: 9 10*3/uL (ref 4.0–10.5)

## 2017-02-17 LAB — COMPREHENSIVE METABOLIC PANEL
ALT: 12 U/L — ABNORMAL LOW (ref 17–63)
AST: 18 U/L (ref 15–41)
Albumin: 3.1 g/dL — ABNORMAL LOW (ref 3.5–5.0)
Alkaline Phosphatase: 140 U/L — ABNORMAL HIGH (ref 38–126)
Anion gap: 9 (ref 5–15)
BUN: 24 mg/dL — ABNORMAL HIGH (ref 6–20)
CHLORIDE: 104 mmol/L (ref 101–111)
CO2: 25 mmol/L (ref 22–32)
Calcium: 8.7 mg/dL — ABNORMAL LOW (ref 8.9–10.3)
Creatinine, Ser: 1.39 mg/dL — ABNORMAL HIGH (ref 0.61–1.24)
GFR, EST AFRICAN AMERICAN: 51 mL/min — AB (ref >60)
GFR, EST NON AFRICAN AMERICAN: 44 mL/min — AB (ref >60)
Glucose, Bld: 144 mg/dL — ABNORMAL HIGH (ref 65–99)
POTASSIUM: 4 mmol/L (ref 3.5–5.1)
Sodium: 138 mmol/L (ref 135–145)
TOTAL PROTEIN: 6.3 g/dL — AB (ref 6.5–8.1)
Total Bilirubin: 1 mg/dL (ref 0.3–1.2)

## 2017-02-17 LAB — HEMOGLOBIN A1C
HEMOGLOBIN A1C: 7.4 % — AB (ref 4.8–5.6)
MEAN PLASMA GLUCOSE: 165.68 mg/dL

## 2017-03-02 ENCOUNTER — Non-Acute Institutional Stay (SKILLED_NURSING_FACILITY): Payer: PPO | Admitting: Internal Medicine

## 2017-03-02 ENCOUNTER — Encounter: Payer: Self-pay | Admitting: Internal Medicine

## 2017-03-02 DIAGNOSIS — E119 Type 2 diabetes mellitus without complications: Secondary | ICD-10-CM | POA: Diagnosis not present

## 2017-03-02 DIAGNOSIS — S7222XS Displaced subtrochanteric fracture of left femur, sequela: Secondary | ICD-10-CM | POA: Diagnosis not present

## 2017-03-02 DIAGNOSIS — D649 Anemia, unspecified: Secondary | ICD-10-CM | POA: Diagnosis not present

## 2017-03-02 DIAGNOSIS — I10 Essential (primary) hypertension: Secondary | ICD-10-CM

## 2017-03-02 DIAGNOSIS — N289 Disorder of kidney and ureter, unspecified: Secondary | ICD-10-CM | POA: Diagnosis not present

## 2017-03-02 DIAGNOSIS — I350 Nonrheumatic aortic (valve) stenosis: Secondary | ICD-10-CM

## 2017-03-02 DIAGNOSIS — R339 Retention of urine, unspecified: Secondary | ICD-10-CM

## 2017-03-02 NOTE — Progress Notes (Signed)
Location:   [facility] Room Number: 133/P Place of Service:  SNF (31)  Provider: Granville Lewis  PCP: [name], MD Patient Care Team: [name], MD as PCP - General (Internal Medicine) Clent Jacks, MD (Ophthalmology) Harl Bowie Alphonse Guild, MD as Consulting Physician (Cardiology)  Extended Emergency Contact Information Primary Emergency Contact: [facility] Address: [address] [place] [address] [place] Phone: [phone] Relation: Daughter Secondary Emergency Contact: Arlyss Queen [place] Phone: [phone] Mobile Phone: [phone] Relation: Daughter  Code Status: Full Code Goals of care:  Advanced Directive information Advanced Directives 03/02/2017  Does Patient Have a Medical Advance Directive? Yes  Type of Advance Directive (No Data)  Does patient want to make changes to medical advance directive? No - Patient declined  Would patient like information on creating a medical advance directive? -     Allergies  Allergen Reactions  . Codeine   . Ezetimibe-Simvastatin     REACTION: myalgias  . Naproxen     REACTION: and all nonsteroidals  . Niacin   . Pravastatin Sodium     REACTION: myalgias no    Chief Complaint  Patient presents with  . Discharge Note    Discharged Visit    HPI:  83 y.o. male  seen today for discharge from facility-she was here for rehabilitation after sustaining a left femur fracture after fall-postop course is been fairly unremarkable he still will need continued PT and OT for strengthening he does have orthopedic follow-up continues on aspirin low-dose twice a day 6 weeks for DVT prophylaxis.  Today he has no acute complaints she is looking forward to going home he lives with his wife with actually is wheelchair-bound but he also has very supportive daughters live very close.  Other medical issues appear to be stable he is a type II diabetic came  here on Lantus Dr. Lyndel Safe did switch him back to his oral glipizide and Januvia has done well with his blood sugars largely in the lower 100s in the morning a bit more variability with occasional 200s later in the day hemoglobin A1c was 7.4 on lab done during his stay here this will warm follow-up by primary care provider.  He also had a history of postop anemia hemoglobin last was 10.6 appears before surgery was 14 he's been started on iron Will update this before discharge and I suspect will need follow-up by primary care provider.  He also had urinary retention hospital has an indwelling Foley catheter and will need urology follow which has been arranged he is on Flomax as well.  He also has a history of chronic kidney disease stage III creatinine was 1.39 BUN 24 on lab done August 30 this appears relatively stable with hospital labs done previously will update this tomorrow as well.  He does have a history of hypertension on Coreg systolics appear to be mainly around 1 4160 daily but this is not consistently elevated at this point will not make medication changes secondary to him going home tomorrow.  He does have a history glaucoma visual acuity appears to be at baseline he is on numerous topical drops.  He also has a history of depression on Zoloft which has been stable during his stay here-currently he is sitting in his wheelchair comfortably has no acute complaints is looking forward to going home    Past Medical History:  Diagnosis Date  .  Aortic valve stenosis   . ASCVD (arteriosclerotic cardiovascular disease)    50% ostial left left anterior desending 60% mid circumflex and normal ejection fractioon in 2006 exertional dyspnea;history of chest discomfort  . Cerebrovascular disease    minimal carotid bruits  . CKD (chronic kidney disease)   . Diabetes mellitus type 2, controlled ([place])    no insulin  . Hyperlipidemia    statin intolerant  . Hypertension   . Tobacco abuse, in  remission    30 pack years stopped in 1971    Past Surgical History:  Procedure Laterality Date  . APPENDECTOMY    . CARPAL TUNNEL RELEASE     surgery left 05/05/2001  . KNEE ARTHROSCOPY     bilaterial; unknown associated surgical procedure  . SHOULDER SURGERY     Right x2; third procedure on left  . TONSILLECTOMY    . TRANSURETHRAL INCISION OF PROSTATE     resection of the prostate      reports that he quit smoking about 47 years ago. His smoking use included Cigarettes. He has a 40.00 pack-year smoking history. He has never used smokeless tobacco. He reports that he does not drink alcohol or use drugs. Social History   Social History  . Marital status: Married    Spouse name: N/A  . Number of children: 4  . Years of education: N/A   Occupational History  . retired Retired    Systems developer   Social History Main Topics  . Smoking status: Former Smoker    Packs/day: 1.00    Years: 40.00    Types: Cigarettes    Quit date: 10/29/1969  . Smokeless tobacco: Never Used  . Alcohol use No  . Drug use: No  . Sexual activity: Not on file   Other Topics Concern  . Not on file   Social History Narrative  . No narrative on file   Functional Status Survey:    Allergies  Allergen Reactions  . Codeine   . Ezetimibe-Simvastatin     REACTION: myalgias  . Naproxen     REACTION: and all nonsteroidals  . Niacin   . Pravastatin Sodium     REACTION: myalgias no    Pertinent  Health Maintenance Due  Topic Date Due  . FOOT EXAM  03/17/2017 (Originally 08/26/1937)  . OPHTHALMOLOGY EXAM  03/17/2017 (Originally 08/26/1937)  . URINE MICROALBUMIN  05/21/2017 (Originally 08/26/1937)  . PNA vac Low Risk Adult (1 of 2 - PCV13) 05/21/2017 (Originally 08/26/1994)  . HEMOGLOBIN A1C  08/18/2017  . INFLUENZA VACCINE  Completed    Medications: Outpatient Encounter Prescriptions as of 03/02/2017  Medication Sig  . acetaminophen (TYLENOL) 325 MG tablet Take 650 mg by mouth every 4 (four)  hours as needed.  Marland Kitchen albuterol (PROVENTIL HFA;VENTOLIN HFA) 108 (90 Base) MCG/ACT inhaler Inhale 2 puffs into the lungs every 4 (four) hours as needed for wheezing or shortness of breath.   Marland Kitchen aspirin 81 MG tablet Take 81 mg by mouth 2 (two) times daily.   . betaxolol (BETOPTIC-S) 0.5 % ophthalmic suspension Place 1 drop into both eyes every morning.   . bimatoprost (LUMIGAN) 0.01 % SOLN Place 1 drop into both eyes daily.  . budesonide-formoterol (SYMBICORT) 160-4.5 MCG/ACT inhaler Inhale 2 puffs into the lungs 2 (two) times daily.  . calcium citrate (CALCITRATE - DOSED IN MG ELEMENTAL CALCIUM) 950 MG tablet Take 200 mg of elemental calcium by mouth daily.  . carvedilol (COREG) 12.5 MG tablet Take 12.5 mg by  mouth 2 (two) times daily with a meal.   . Cholecalciferol 1000 units tablet Take 1,000 Units by mouth daily.  . dorzolamide (TRUSOPT) 2 % ophthalmic solution Place 1 drop into the left eye 2 (two) times daily.    Marland Kitchen glipiZIDE (GLUCOTROL) 5 MG tablet Take 5 mg by mouth daily before breakfast.  . insulin lispro (HUMALOG) 100 UNIT/ML cartridge 3 (three) times daily with meals. Per sliding scale  . iron polysaccharides (NIFEREX) 150 MG capsule Take 150 mg by mouth daily.  . pantoprazole (PROTONIX) 40 MG tablet Take 40 mg by mouth daily.  . pilocarpine (PILOCAR) 2 % ophthalmic solution Place 1 drop into the left eye 4 (four) times daily.   . sennosides-docusate sodium (SENOKOT-S) 8.6-50 MG tablet Take 2 tablets by mouth 2 (two) times daily.  . sertraline (ZOLOFT) 25 MG tablet Take 25 mg by mouth daily.  . sitaGLIPtin (JANUVIA) 50 MG tablet Take 50 mg by mouth daily.  . tamsulosin (FLOMAX) 0.4 MG CAPS capsule Take 0.4 mg by mouth daily.  . traMADol (ULTRAM) 50 MG tablet Take 50 mg by mouth every 6 (six) hours as needed.  . vitamin C (ASCORBIC ACID) 500 MG tablet Take 500 mg by mouth 3 (three) times daily.   . [DISCONTINUED] HYDROcodone-acetaminophen (NORCO/VICODIN) 5-325 MG tablet Take 1 tablet by  mouth every 6 (six) hours as needed for moderate pain.  . [DISCONTINUED] HYDROcodone-acetaminophen (NORCO/VICODIN) 5-325 MG tablet Take 2 tablets by mouth every 6 (six) hours as needed for moderate pain.  . [DISCONTINUED] Insulin Glargine (LANTUS SOLOSTAR) 100 UNIT/ML Solostar Pen Inject 10 Units into the skin daily at 10 pm.  . [DISCONTINUED] ondansetron (ZOFRAN) 4 MG tablet Take 4 mg by mouth every 6 (six) hours as needed for nausea or vomiting.   No facility-administered encounter medications on file as of 03/02/2017.      Review of Systems   \In general does not complaining any fever or chills.  Skin does not complain of rashes or itching surgical site from what I can tell appears to be benign on the left hip.  Head ears eyes nose mouth and throat does not complaining of any acute visual changes or sore throat or nasal discharge.  Respiratory denies shortness breath or cough.  Cardiac denies chest pain lower extremity edema on the left appears to have improved he has compression hose on.  GI does not complain of nausea vomiting diarrhea constipation or abdominal discomfort.  GU does have the indwelling Foley catheter does not complain of burning with urination.  Musculoskeletal at this point hip pain appears to be controlled he has when necessary tramadol-does not complain of joint pain this morning.  Neurologic is not complaining of any dizziness headache or numbness.  In psych history depression at this appears well controlled continues to be pleasant looking forward to going home.    Vitals:   03/02/17 1040  BP: (!) 161/75  Pulse: 79  Resp: 20  Temp: (!) 97 F (36.1 C)  TempSrc: Oral  Note recent baseline appears to be around 161 systolically . Physical Exam  In general this is a pleasant elderly male in no distress sitting comfortably in his wheelchair.  His skin is warm and dry there are Steri-Strips over the left hip surgical site a do not see sign of  infection.  Eyes pupils appear reactive light sclerae and conjunctivae clear visual acuity appears grossly intact.  Oropharynx is clear mucous membranes moist.  Heart is regular rate and rhythm with  a slight murmur appreciated-edema lower extremities appears improved especially on the left he has compression hose on.  Chest is clear to auscultation there is no labored breathing.  Abdomen is soft nontender somewhat obese with positive bowel sounds.  Musculoskeletal moves all extremities 4 is still largely ambulating in a wheelchair-he will need continued PT and OT strength appears preserved all 4 extremities.  Neurologic is dosing intact speech is clear no lateralizing findings.  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 No results for input(s): AMMONIA in the last 8760 hours. CBC:  Recent Labs  02/02/17 2345 02/12/17 0830 02/17/17 0719  WBC 9.6 7.5 9.0  NEUTROABS 7.6 5.1  --   HGB 14.0 10.7* 10.6*  HCT 41.4 32.9* 32.7*  MCV 91.4 92.9 93.2  PLT 206 273 262   Cardiac Enzymes:  Recent Labs  02/02/17 2345  TROPONINI <0.03   BNP: Invalid input(s): POCBNP CBG: No results for input(s): GLUCAP in the last 8760 hours.  Procedures and Imaging Studies During Stay: Dg Chest 1 View  Result Date: 02/02/2017 CLINICAL DATA:  Fall today injuring left hip and striking head. Left hip fracture. Initial encounter. EXAM:  CHEST 1 VIEW COMPARISON:  Radiograph 01/24/2016 FINDINGS: Low lung volumes. Cardiomegaly with tortuous atherosclerotic thoracic aorta. No pulmonary edema. No pneumothorax or confluent airspace disease. Mild bibasilar atelectasis. No pleural effusion. Degenerative change in both shoulders. IMPRESSION: 1. Low lung volumes without acute abnormality. 2. Chronic cardiomegaly with tortuous atherosclerotic aorta. Electronically Signed   By: Jeb Levering M.D.   On: 02/02/2017 22:50   Ct Head Wo Contrast  Result Date: 02/02/2017 CLINICAL DATA:  83 y/o  M; status post fall with head injury. EXAM: CT HEAD WITHOUT CONTRAST TECHNIQUE: Contiguous axial images were obtained from the base of the skull through the vertex without intravenous contrast. COMPARISON:  01/01/2008 CT head FINDINGS: Brain: No evidence of acute infarction, hemorrhage, hydrocephalus, extra-axial collection or mass lesion/mass effect. Scattered foci of hypoattenuation in subcortical periventricular white matter compatible moderate chronic microvascular ischemic changes for age and progressed from 2009. Mild brain parenchymal volume loss. Vascular: Calcific atherosclerosis of carotid siphons. No hyperdense vessel identified. Skull: Normal. Negative for fracture or focal lesion. Sinuses/Orbits: Aerosolized secretions within left posterior ethmoid air cells. Otherwise negative. Bilateral intra-ocular lens replacement. Other: None. IMPRESSION: 1. No acute intracranial abnormality or displaced calvarial fracture. 2. Moderate for age chronic microvascular ischemic changes and mild parenchymal volume loss of the brain with progression from 2009. Electronically Signed   By: Kristine Garbe M.D.   On: 02/02/2017 22:53   Dg Hip Unilat With Pelvis 2-3 Views Left  Result Date: 02/02/2017 CLINICAL DATA:  Lost balance and fell today injuring left hip. Left hip pain. Initial encounter. EXAM: DG HIP (WITH OR WITHOUT PELVIS) 2-3V LEFT COMPARISON:  Pelvis  radiograph 01/24/2016 FINDINGS: Displaced comminuted proximal femur fracture with displacement involving the lesser trochanter. Oblique fracture involves the subtrochanteric proximal femoral shaft. There is minimal apex lateral angulation. Femoral head remains seated with osteoarthritis. Pubic rami are intact. Pubic symphysis is congruent. IMPRESSION: Comminuted displaced proximal femur fracture with displacement of a lesser trochanter as well as displaced subtrochanteric involvement. Electronically Signed   By: Jeb Levering M.D.   On: 02/02/2017 22:52    Assessment/Plan:     #1 history of left femoral fracture-again he will need continued PT and OT for strengthening to help with ambulation-also has orthopedic follow-up arranged he is on vitamin D with calcium-continues on aspirin low-dose twice a day for total of 6 weeks for DVT prophylaxis. He will need a bedside commode secondary to fall risk and continued lower extremity weakness with surgery- Per orthopedics he is weightbearing as tolerated  #2 diabetes type 2 as noted above blood sugars appear to be fairly well controlled somewhat elevated readings occasionally in the afternoon-he is on glipizide and Januvia will defer follow-up to primary care provider.--We'll discontinue sliding scale since he has rarely used it during his stay here.    #3-history of urinary retention he does have urology follow-up arranged he does have an Foley catheter at this point continues on Flomax.  #4-history of anemia most likely postop he's been started on iron Will update a CBC before discharge hemoglobin on 02/17/2017 was 10.6.  #5-history of chronic kidney disease creatinine was 1.39 BUN of 24 on lab done August 30 we'll update this appears this is fairly comparable with what his kidney function was initially at the hospital.  #1-[id number] systolic somewhat elevated today at 161 but appears to run more in the 140 range at this point since he is about  to go home will defer to primary care provider and continue Coreg.  Continue to monitor while he is in facility [place] suspect there is variability here.  #7-history of glaucoma continues on topical eyedrops he is followed by ophthalmology this appears relatively stable during his stay here.  Number 8-history of depression continues on Zoloft this has been stable as well.  #9 history of COPD continues on Proventil and Symbicort respiratory status appears to be stable and has not really been in issue during his stay here.  #10 history of moderate aortic stenosis he has been followed by cardiology appears to be relatively asymtomatic  this point  Of note Will update a CBC to keep an eye on his hemoglobin as well as a CMP to update his renal function I note alkaline phosphatase was minimally elevated on last CMP I suspect this was due to the fracture and subsequent surgery.  Again he will need PT and OT at home further strengthening as well as nursing support for his multiple medical issues including urinary retention with Foley catheter as well as diabetes type 2.  Will be going home with his wife with actually is wheelchair-bound and has very supportive daughters who live very close.    CPT 99316-of note greater than 30 minutes spent on this discharge summary-greater than 50% of time spent coordinating a care for numerous diagnoses

## 2017-03-03 ENCOUNTER — Encounter (HOSPITAL_COMMUNITY)
Admission: RE | Admit: 2017-03-03 | Discharge: 2017-03-03 | Disposition: A | Discharge status: Home / Self Care | Payer: PPO | Source: Skilled Nursing Facility | Attending: Internal Medicine | Admitting: Internal Medicine

## 2017-03-03 DIAGNOSIS — N183 Chronic kidney disease, stage 3 (moderate): Secondary | ICD-10-CM | POA: Diagnosis not present

## 2017-03-03 DIAGNOSIS — E1122 Type 2 diabetes mellitus with diabetic chronic kidney disease: Secondary | ICD-10-CM | POA: Diagnosis not present

## 2017-03-03 DIAGNOSIS — I350 Nonrheumatic aortic (valve) stenosis: Secondary | ICD-10-CM | POA: Diagnosis not present

## 2017-03-03 DIAGNOSIS — S72012D Unspecified intracapsular fracture of left femur, subsequent encounter for closed fracture with routine healing: Secondary | ICD-10-CM | POA: Diagnosis not present

## 2017-03-03 DIAGNOSIS — I129 Hypertensive chronic kidney disease with stage 1 through stage 4 chronic kidney disease, or unspecified chronic kidney disease: Secondary | ICD-10-CM | POA: Diagnosis not present

## 2017-03-03 DIAGNOSIS — I2510 Atherosclerotic heart disease of native coronary artery without angina pectoris: Secondary | ICD-10-CM | POA: Diagnosis not present

## 2017-03-03 DIAGNOSIS — E1151 Type 2 diabetes mellitus with diabetic peripheral angiopathy without gangrene: Secondary | ICD-10-CM | POA: Diagnosis not present

## 2017-03-03 LAB — COMPREHENSIVE METABOLIC PANEL
ALBUMIN: 3.3 g/dL — AB (ref 3.5–5.0)
ALK PHOS: 146 U/L — AB (ref 38–126)
ALT: 22 U/L (ref 17–63)
AST: 17 U/L (ref 15–41)
Anion gap: 8 (ref 5–15)
BILIRUBIN TOTAL: 0.8 mg/dL (ref 0.3–1.2)
BUN: 24 mg/dL — AB (ref 6–20)
CALCIUM: 9 mg/dL (ref 8.9–10.3)
CO2: 27 mmol/L (ref 22–32)
Chloride: 104 mmol/L (ref 101–111)
Creatinine, Ser: 1.36 mg/dL — ABNORMAL HIGH (ref 0.61–1.24)
GFR calc Af Amer: 52 mL/min — ABNORMAL LOW (ref >60)
GFR calc non Af Amer: 45 mL/min — ABNORMAL LOW (ref >60)
GLUCOSE: 113 mg/dL — AB (ref 65–99)
Potassium: 3.7 mmol/L (ref 3.5–5.1)
SODIUM: 139 mmol/L (ref 135–145)
TOTAL PROTEIN: 6.6 g/dL (ref 6.5–8.1)

## 2017-03-03 LAB — CBC WITH DIFFERENTIAL/PLATELET
BASOS PCT: 0 %
Basophils Absolute: 0 10*3/uL (ref 0.0–0.1)
EOS ABS: 0.4 10*3/uL (ref 0.0–0.7)
Eosinophils Relative: 6 %
HEMATOCRIT: 34.7 % — AB (ref 39.0–52.0)
HEMOGLOBIN: 11.1 g/dL — AB (ref 13.0–17.0)
Lymphocytes Relative: 21 %
Lymphs Abs: 1.4 10*3/uL (ref 0.7–4.0)
MCH: 29.7 pg (ref 26.0–34.0)
MCHC: 32 g/dL (ref 30.0–36.0)
MCV: 92.8 fL (ref 78.0–100.0)
MONOS PCT: 12 %
Monocytes Absolute: 0.8 10*3/uL (ref 0.1–1.0)
NEUTROS ABS: 4.1 10*3/uL (ref 1.7–7.7)
NEUTROS PCT: 61 %
Platelets: 191 10*3/uL (ref 150–400)
RBC: 3.74 MIL/uL — AB (ref 4.22–5.81)
RDW: 15.1 % (ref 11.5–15.5)
WBC: 6.6 10*3/uL (ref 4.0–10.5)

## 2017-03-08 ENCOUNTER — Other Ambulatory Visit: Payer: Self-pay | Admitting: *Deleted

## 2017-03-08 NOTE — Patient Outreach (Signed)
[facility]) Care Management  03/08/2017  Anthony Murray 1927-07-11 [id number]  Referral from [facility]; Discharged from [facility] 03/03/2017:  Per chart review: IV:HOYWVX displaced subtrochanteric fracture of left femur Surgery-open treatment of left femur with intramedullary nail.   Telephone call to patient who gave HIPPA verification. Gave spouse permission to take call for him. Spoke with spouse Basilia Jumbo who advised that patient does have hearing difficulty. HIPPA verification received from spouse who takes calls for patient due to hearing problem.  Spouse confirms that patient sustained broken left femur after a fall that required surgical repair with nail placement. States he was transferred from [facility] to [facility] for surgery. From [facility] patient was discharged to [facility] for rehabilitation services with discharge to home 03/03/2017. Spouse voices that patient's 2 daughters are his caregivers. States patient does receive home health services (PT, RN).   Appointments for follow up are set for 9/21 with PCP-Dr. Wende Neighbors; 10/1-Surgeon; 9/24-Urologist. States daughters transport patient to medical appointments & manage his medications and give patient medications. [place] delivers the medications.   Patient currently has handicapped bathroom & can acces with his wheelchair.  Family assists patient with therapy exercises as recommended by PT. States patient is currently having only minimal pain.   Spouse states patient's healthcare needs are currently being met & no needs for [facility] services at this time.   Plan: Send [facility] contact information to patient. Send to care management assistant to close case.   Sherrin Daisy, RN BSN [place] Management Coordinator [facility] Care Management  [phone]

## 2017-03-10 ENCOUNTER — Encounter: Payer: Self-pay | Admitting: *Deleted

## 2017-03-11 DIAGNOSIS — J449 Chronic obstructive pulmonary disease, unspecified: Secondary | ICD-10-CM | POA: Diagnosis not present

## 2017-03-11 DIAGNOSIS — R339 Retention of urine, unspecified: Secondary | ICD-10-CM | POA: Diagnosis not present

## 2017-03-11 DIAGNOSIS — S72012G Unspecified intracapsular fracture of left femur, subsequent encounter for closed fracture with delayed healing: Secondary | ICD-10-CM | POA: Diagnosis not present

## 2017-03-11 DIAGNOSIS — Z712 Person consulting for explanation of examination or test findings: Secondary | ICD-10-CM | POA: Diagnosis not present

## 2017-03-11 DIAGNOSIS — R944 Abnormal results of kidney function studies: Secondary | ICD-10-CM | POA: Diagnosis not present

## 2017-03-11 DIAGNOSIS — D509 Iron deficiency anemia, unspecified: Secondary | ICD-10-CM | POA: Diagnosis not present

## 2017-03-11 DIAGNOSIS — E1122 Type 2 diabetes mellitus with diabetic chronic kidney disease: Secondary | ICD-10-CM | POA: Diagnosis not present

## 2017-03-14 DIAGNOSIS — N401 Enlarged prostate with lower urinary tract symptoms: Secondary | ICD-10-CM | POA: Diagnosis not present

## 2017-03-14 DIAGNOSIS — R338 Other retention of urine: Secondary | ICD-10-CM | POA: Diagnosis not present

## 2017-03-15 ENCOUNTER — Encounter: Payer: Self-pay | Admitting: *Deleted

## 2017-03-21 DIAGNOSIS — S72142D Displaced intertrochanteric fracture of left femur, subsequent encounter for closed fracture with routine healing: Secondary | ICD-10-CM | POA: Diagnosis not present

## 2017-03-21 DIAGNOSIS — I129 Hypertensive chronic kidney disease with stage 1 through stage 4 chronic kidney disease, or unspecified chronic kidney disease: Secondary | ICD-10-CM | POA: Diagnosis not present

## 2017-03-21 DIAGNOSIS — N189 Chronic kidney disease, unspecified: Secondary | ICD-10-CM | POA: Diagnosis not present

## 2017-03-21 DIAGNOSIS — M1612 Unilateral primary osteoarthritis, left hip: Secondary | ICD-10-CM | POA: Diagnosis not present

## 2017-03-21 DIAGNOSIS — Z4789 Encounter for other orthopedic aftercare: Secondary | ICD-10-CM | POA: Diagnosis not present

## 2017-03-21 DIAGNOSIS — Z888 Allergy status to other drugs, medicaments and biological substances status: Secondary | ICD-10-CM | POA: Diagnosis not present

## 2017-03-21 DIAGNOSIS — Z8673 Personal history of transient ischemic attack (TIA), and cerebral infarction without residual deficits: Secondary | ICD-10-CM | POA: Diagnosis not present

## 2017-03-21 DIAGNOSIS — S7222XD Displaced subtrochanteric fracture of left femur, subsequent encounter for closed fracture with routine healing: Secondary | ICD-10-CM | POA: Diagnosis not present

## 2017-03-21 DIAGNOSIS — M1712 Unilateral primary osteoarthritis, left knee: Secondary | ICD-10-CM | POA: Diagnosis not present

## 2017-03-21 DIAGNOSIS — E119 Type 2 diabetes mellitus without complications: Secondary | ICD-10-CM | POA: Diagnosis not present

## 2017-03-21 DIAGNOSIS — M533 Sacrococcygeal disorders, not elsewhere classified: Secondary | ICD-10-CM | POA: Diagnosis not present

## 2017-03-21 DIAGNOSIS — Z87891 Personal history of nicotine dependence: Secondary | ICD-10-CM | POA: Diagnosis not present

## 2017-03-21 DIAGNOSIS — Z882 Allergy status to sulfonamides status: Secondary | ICD-10-CM | POA: Diagnosis not present

## 2017-03-22 ENCOUNTER — Ambulatory Visit (INDEPENDENT_AMBULATORY_CARE_PROVIDER_SITE_OTHER): Payer: PPO | Admitting: Urology

## 2017-03-22 DIAGNOSIS — N401 Enlarged prostate with lower urinary tract symptoms: Secondary | ICD-10-CM

## 2017-03-22 DIAGNOSIS — R3914 Feeling of incomplete bladder emptying: Secondary | ICD-10-CM

## 2017-03-26 DIAGNOSIS — M79652 Pain in left thigh: Secondary | ICD-10-CM | POA: Diagnosis not present

## 2017-03-28 DIAGNOSIS — J449 Chronic obstructive pulmonary disease, unspecified: Secondary | ICD-10-CM | POA: Diagnosis not present

## 2017-03-28 DIAGNOSIS — E782 Mixed hyperlipidemia: Secondary | ICD-10-CM | POA: Diagnosis not present

## 2017-03-28 DIAGNOSIS — E1122 Type 2 diabetes mellitus with diabetic chronic kidney disease: Secondary | ICD-10-CM | POA: Diagnosis not present

## 2017-03-28 DIAGNOSIS — R339 Retention of urine, unspecified: Secondary | ICD-10-CM | POA: Diagnosis not present

## 2017-03-28 DIAGNOSIS — D509 Iron deficiency anemia, unspecified: Secondary | ICD-10-CM | POA: Diagnosis not present

## 2017-03-28 DIAGNOSIS — Z6831 Body mass index (BMI) 31.0-31.9, adult: Secondary | ICD-10-CM | POA: Diagnosis not present

## 2017-03-28 DIAGNOSIS — N183 Chronic kidney disease, stage 3 (moderate): Secondary | ICD-10-CM | POA: Diagnosis not present

## 2017-03-28 DIAGNOSIS — N189 Chronic kidney disease, unspecified: Secondary | ICD-10-CM | POA: Diagnosis not present

## 2017-03-28 DIAGNOSIS — E119 Type 2 diabetes mellitus without complications: Secondary | ICD-10-CM | POA: Diagnosis not present

## 2017-03-28 DIAGNOSIS — S72012A Unspecified intracapsular fracture of left femur, initial encounter for closed fracture: Secondary | ICD-10-CM | POA: Diagnosis not present

## 2017-04-02 DIAGNOSIS — N189 Chronic kidney disease, unspecified: Secondary | ICD-10-CM | POA: Diagnosis not present

## 2017-04-02 DIAGNOSIS — J449 Chronic obstructive pulmonary disease, unspecified: Secondary | ICD-10-CM | POA: Diagnosis not present

## 2017-04-02 DIAGNOSIS — M84459A Pathological fracture, hip, unspecified, initial encounter for fracture: Secondary | ICD-10-CM | POA: Diagnosis not present

## 2017-04-04 DIAGNOSIS — Z9181 History of falling: Secondary | ICD-10-CM | POA: Diagnosis not present

## 2017-04-04 DIAGNOSIS — R339 Retention of urine, unspecified: Secondary | ICD-10-CM | POA: Diagnosis not present

## 2017-04-04 DIAGNOSIS — J449 Chronic obstructive pulmonary disease, unspecified: Secondary | ICD-10-CM | POA: Diagnosis not present

## 2017-04-04 DIAGNOSIS — E1122 Type 2 diabetes mellitus with diabetic chronic kidney disease: Secondary | ICD-10-CM | POA: Diagnosis not present

## 2017-04-04 DIAGNOSIS — Z6831 Body mass index (BMI) 31.0-31.9, adult: Secondary | ICD-10-CM | POA: Diagnosis not present

## 2017-04-04 DIAGNOSIS — D509 Iron deficiency anemia, unspecified: Secondary | ICD-10-CM | POA: Diagnosis not present

## 2017-04-04 DIAGNOSIS — R600 Localized edema: Secondary | ICD-10-CM | POA: Diagnosis not present

## 2017-04-04 DIAGNOSIS — S72012A Unspecified intracapsular fracture of left femur, initial encounter for closed fracture: Secondary | ICD-10-CM | POA: Diagnosis not present

## 2017-04-04 DIAGNOSIS — R944 Abnormal results of kidney function studies: Secondary | ICD-10-CM | POA: Diagnosis not present

## 2017-04-04 DIAGNOSIS — E782 Mixed hyperlipidemia: Secondary | ICD-10-CM | POA: Diagnosis not present

## 2017-04-26 ENCOUNTER — Ambulatory Visit: Payer: PPO | Admitting: Urology

## 2017-04-26 DIAGNOSIS — R350 Frequency of micturition: Secondary | ICD-10-CM | POA: Diagnosis not present

## 2017-04-26 DIAGNOSIS — N3281 Overactive bladder: Secondary | ICD-10-CM | POA: Diagnosis not present

## 2017-04-26 DIAGNOSIS — N401 Enlarged prostate with lower urinary tract symptoms: Secondary | ICD-10-CM | POA: Diagnosis not present

## 2017-05-02 DIAGNOSIS — S72012D Unspecified intracapsular fracture of left femur, subsequent encounter for closed fracture with routine healing: Secondary | ICD-10-CM | POA: Diagnosis not present

## 2017-05-02 DIAGNOSIS — N183 Chronic kidney disease, stage 3 (moderate): Secondary | ICD-10-CM | POA: Diagnosis not present

## 2017-05-02 DIAGNOSIS — I350 Nonrheumatic aortic (valve) stenosis: Secondary | ICD-10-CM | POA: Diagnosis not present

## 2017-05-02 DIAGNOSIS — I129 Hypertensive chronic kidney disease with stage 1 through stage 4 chronic kidney disease, or unspecified chronic kidney disease: Secondary | ICD-10-CM | POA: Diagnosis not present

## 2017-05-02 DIAGNOSIS — Z7984 Long term (current) use of oral hypoglycemic drugs: Secondary | ICD-10-CM | POA: Diagnosis not present

## 2017-05-02 DIAGNOSIS — I2510 Atherosclerotic heart disease of native coronary artery without angina pectoris: Secondary | ICD-10-CM | POA: Diagnosis not present

## 2017-05-02 DIAGNOSIS — E1151 Type 2 diabetes mellitus with diabetic peripheral angiopathy without gangrene: Secondary | ICD-10-CM | POA: Diagnosis not present

## 2017-05-02 DIAGNOSIS — E1122 Type 2 diabetes mellitus with diabetic chronic kidney disease: Secondary | ICD-10-CM | POA: Diagnosis not present

## 2017-05-03 DIAGNOSIS — N189 Chronic kidney disease, unspecified: Secondary | ICD-10-CM | POA: Diagnosis not present

## 2017-05-03 DIAGNOSIS — J449 Chronic obstructive pulmonary disease, unspecified: Secondary | ICD-10-CM | POA: Diagnosis not present

## 2017-05-03 DIAGNOSIS — M84459A Pathological fracture, hip, unspecified, initial encounter for fracture: Secondary | ICD-10-CM | POA: Diagnosis not present

## 2017-06-02 DIAGNOSIS — J449 Chronic obstructive pulmonary disease, unspecified: Secondary | ICD-10-CM | POA: Diagnosis not present

## 2017-06-02 DIAGNOSIS — M84459A Pathological fracture, hip, unspecified, initial encounter for fracture: Secondary | ICD-10-CM | POA: Diagnosis not present

## 2017-06-02 DIAGNOSIS — N189 Chronic kidney disease, unspecified: Secondary | ICD-10-CM | POA: Diagnosis not present

## 2017-06-06 DIAGNOSIS — M79669 Pain in unspecified lower leg: Secondary | ICD-10-CM | POA: Diagnosis not present

## 2017-06-06 DIAGNOSIS — R296 Repeated falls: Secondary | ICD-10-CM | POA: Diagnosis not present

## 2017-06-20 DIAGNOSIS — N189 Chronic kidney disease, unspecified: Secondary | ICD-10-CM | POA: Diagnosis not present

## 2017-06-20 DIAGNOSIS — M84459A Pathological fracture, hip, unspecified, initial encounter for fracture: Secondary | ICD-10-CM | POA: Diagnosis not present

## 2017-06-20 DIAGNOSIS — J449 Chronic obstructive pulmonary disease, unspecified: Secondary | ICD-10-CM | POA: Diagnosis not present

## 2017-06-27 DIAGNOSIS — I2510 Atherosclerotic heart disease of native coronary artery without angina pectoris: Secondary | ICD-10-CM | POA: Diagnosis not present

## 2017-06-27 DIAGNOSIS — R944 Abnormal results of kidney function studies: Secondary | ICD-10-CM | POA: Diagnosis not present

## 2017-06-27 DIAGNOSIS — R339 Retention of urine, unspecified: Secondary | ICD-10-CM | POA: Diagnosis not present

## 2017-06-27 DIAGNOSIS — N183 Chronic kidney disease, stage 3 (moderate): Secondary | ICD-10-CM | POA: Diagnosis not present

## 2017-06-27 DIAGNOSIS — S7222XD Displaced subtrochanteric fracture of left femur, subsequent encounter for closed fracture with routine healing: Secondary | ICD-10-CM | POA: Diagnosis not present

## 2017-06-27 DIAGNOSIS — R296 Repeated falls: Secondary | ICD-10-CM | POA: Diagnosis not present

## 2017-06-27 DIAGNOSIS — K5793 Diverticulitis of intestine, part unspecified, without perforation or abscess with bleeding: Secondary | ICD-10-CM | POA: Diagnosis not present

## 2017-06-27 DIAGNOSIS — E782 Mixed hyperlipidemia: Secondary | ICD-10-CM | POA: Diagnosis not present

## 2017-06-27 DIAGNOSIS — I10 Essential (primary) hypertension: Secondary | ICD-10-CM | POA: Diagnosis not present

## 2017-06-27 DIAGNOSIS — E1122 Type 2 diabetes mellitus with diabetic chronic kidney disease: Secondary | ICD-10-CM | POA: Diagnosis not present

## 2017-06-27 DIAGNOSIS — J449 Chronic obstructive pulmonary disease, unspecified: Secondary | ICD-10-CM | POA: Diagnosis not present

## 2017-06-27 DIAGNOSIS — N401 Enlarged prostate with lower urinary tract symptoms: Secondary | ICD-10-CM | POA: Diagnosis not present

## 2017-06-27 DIAGNOSIS — M79669 Pain in unspecified lower leg: Secondary | ICD-10-CM | POA: Diagnosis not present

## 2017-06-28 DIAGNOSIS — J441 Chronic obstructive pulmonary disease with (acute) exacerbation: Secondary | ICD-10-CM | POA: Diagnosis not present

## 2017-07-01 DIAGNOSIS — Z6831 Body mass index (BMI) 31.0-31.9, adult: Secondary | ICD-10-CM | POA: Diagnosis not present

## 2017-07-01 DIAGNOSIS — J441 Chronic obstructive pulmonary disease with (acute) exacerbation: Secondary | ICD-10-CM | POA: Diagnosis not present

## 2017-07-03 DIAGNOSIS — M84459A Pathological fracture, hip, unspecified, initial encounter for fracture: Secondary | ICD-10-CM | POA: Diagnosis not present

## 2017-07-03 DIAGNOSIS — J449 Chronic obstructive pulmonary disease, unspecified: Secondary | ICD-10-CM | POA: Diagnosis not present

## 2017-07-03 DIAGNOSIS — N189 Chronic kidney disease, unspecified: Secondary | ICD-10-CM | POA: Diagnosis not present

## 2017-07-04 DIAGNOSIS — S72099A Other fracture of head and neck of unspecified femur, initial encounter for closed fracture: Secondary | ICD-10-CM | POA: Diagnosis not present

## 2017-07-04 DIAGNOSIS — I2510 Atherosclerotic heart disease of native coronary artery without angina pectoris: Secondary | ICD-10-CM | POA: Diagnosis not present

## 2017-07-04 DIAGNOSIS — I10 Essential (primary) hypertension: Secondary | ICD-10-CM | POA: Diagnosis not present

## 2017-07-04 DIAGNOSIS — N183 Chronic kidney disease, stage 3 (moderate): Secondary | ICD-10-CM | POA: Diagnosis not present

## 2017-07-04 DIAGNOSIS — Z6830 Body mass index (BMI) 30.0-30.9, adult: Secondary | ICD-10-CM | POA: Diagnosis not present

## 2017-07-04 DIAGNOSIS — H409 Unspecified glaucoma: Secondary | ICD-10-CM | POA: Diagnosis not present

## 2017-07-04 DIAGNOSIS — M19049 Primary osteoarthritis, unspecified hand: Secondary | ICD-10-CM | POA: Diagnosis not present

## 2017-07-04 DIAGNOSIS — N401 Enlarged prostate with lower urinary tract symptoms: Secondary | ICD-10-CM | POA: Diagnosis not present

## 2017-07-04 DIAGNOSIS — E782 Mixed hyperlipidemia: Secondary | ICD-10-CM | POA: Diagnosis not present

## 2017-07-04 DIAGNOSIS — F339 Major depressive disorder, recurrent, unspecified: Secondary | ICD-10-CM | POA: Diagnosis not present

## 2017-07-04 DIAGNOSIS — J449 Chronic obstructive pulmonary disease, unspecified: Secondary | ICD-10-CM | POA: Diagnosis not present

## 2017-07-04 DIAGNOSIS — E1122 Type 2 diabetes mellitus with diabetic chronic kidney disease: Secondary | ICD-10-CM | POA: Diagnosis not present

## 2017-07-08 DIAGNOSIS — Z961 Presence of intraocular lens: Secondary | ICD-10-CM | POA: Diagnosis not present

## 2017-07-08 DIAGNOSIS — H401123 Primary open-angle glaucoma, left eye, severe stage: Secondary | ICD-10-CM | POA: Diagnosis not present

## 2017-07-27 ENCOUNTER — Inpatient Hospital Stay (HOSPITAL_COMMUNITY)
Admission: EM | Admit: 2017-07-27 | Discharge: 2017-08-01 | DRG: 871 | Disposition: A | Discharge status: Home Health | Payer: PPO | Attending: Internal Medicine | Admitting: Internal Medicine

## 2017-07-27 ENCOUNTER — Emergency Department (HOSPITAL_COMMUNITY): Payer: PPO

## 2017-07-27 ENCOUNTER — Other Ambulatory Visit: Payer: Self-pay

## 2017-07-27 ENCOUNTER — Encounter (HOSPITAL_COMMUNITY): Payer: Self-pay

## 2017-07-27 ENCOUNTER — Inpatient Hospital Stay (HOSPITAL_COMMUNITY): Payer: PPO

## 2017-07-27 ENCOUNTER — Other Ambulatory Visit (HOSPITAL_COMMUNITY): Payer: Self-pay

## 2017-07-27 DIAGNOSIS — N183 Chronic kidney disease, stage 3 unspecified: Secondary | ICD-10-CM | POA: Diagnosis present

## 2017-07-27 DIAGNOSIS — I358 Other nonrheumatic aortic valve disorders: Secondary | ICD-10-CM

## 2017-07-27 DIAGNOSIS — R29700 NIHSS score 0: Secondary | ICD-10-CM | POA: Diagnosis present

## 2017-07-27 DIAGNOSIS — J449 Chronic obstructive pulmonary disease, unspecified: Secondary | ICD-10-CM | POA: Diagnosis not present

## 2017-07-27 DIAGNOSIS — I350 Nonrheumatic aortic (valve) stenosis: Secondary | ICD-10-CM | POA: Diagnosis present

## 2017-07-27 DIAGNOSIS — E785 Hyperlipidemia, unspecified: Secondary | ICD-10-CM | POA: Diagnosis not present

## 2017-07-27 DIAGNOSIS — H409 Unspecified glaucoma: Secondary | ICD-10-CM | POA: Diagnosis not present

## 2017-07-27 DIAGNOSIS — G9340 Encephalopathy, unspecified: Secondary | ICD-10-CM

## 2017-07-27 DIAGNOSIS — Z87891 Personal history of nicotine dependence: Secondary | ICD-10-CM | POA: Diagnosis not present

## 2017-07-27 DIAGNOSIS — K219 Gastro-esophageal reflux disease without esophagitis: Secondary | ICD-10-CM | POA: Diagnosis not present

## 2017-07-27 DIAGNOSIS — J329 Chronic sinusitis, unspecified: Secondary | ICD-10-CM | POA: Diagnosis present

## 2017-07-27 DIAGNOSIS — I6529 Occlusion and stenosis of unspecified carotid artery: Secondary | ICD-10-CM | POA: Diagnosis not present

## 2017-07-27 DIAGNOSIS — I340 Nonrheumatic mitral (valve) insufficiency: Secondary | ICD-10-CM | POA: Diagnosis not present

## 2017-07-27 DIAGNOSIS — E118 Type 2 diabetes mellitus with unspecified complications: Secondary | ICD-10-CM | POA: Diagnosis not present

## 2017-07-27 DIAGNOSIS — I339 Acute and subacute endocarditis, unspecified: Secondary | ICD-10-CM | POA: Diagnosis present

## 2017-07-27 DIAGNOSIS — Z7982 Long term (current) use of aspirin: Secondary | ICD-10-CM | POA: Diagnosis not present

## 2017-07-27 DIAGNOSIS — E1151 Type 2 diabetes mellitus with diabetic peripheral angiopathy without gangrene: Secondary | ICD-10-CM | POA: Diagnosis present

## 2017-07-27 DIAGNOSIS — I10 Essential (primary) hypertension: Secondary | ICD-10-CM | POA: Diagnosis not present

## 2017-07-27 DIAGNOSIS — I129 Hypertensive chronic kidney disease with stage 1 through stage 4 chronic kidney disease, or unspecified chronic kidney disease: Secondary | ICD-10-CM | POA: Diagnosis present

## 2017-07-27 DIAGNOSIS — R531 Weakness: Secondary | ICD-10-CM | POA: Diagnosis not present

## 2017-07-27 DIAGNOSIS — I76 Septic arterial embolism: Secondary | ICD-10-CM | POA: Diagnosis present

## 2017-07-27 DIAGNOSIS — Z79899 Other long term (current) drug therapy: Secondary | ICD-10-CM

## 2017-07-27 DIAGNOSIS — I639 Cerebral infarction, unspecified: Secondary | ICD-10-CM

## 2017-07-27 DIAGNOSIS — R4182 Altered mental status, unspecified: Secondary | ICD-10-CM | POA: Diagnosis not present

## 2017-07-27 DIAGNOSIS — I39 Endocarditis and heart valve disorders in diseases classified elsewhere: Secondary | ICD-10-CM | POA: Diagnosis not present

## 2017-07-27 DIAGNOSIS — A419 Sepsis, unspecified organism: Principal | ICD-10-CM | POA: Diagnosis present

## 2017-07-27 DIAGNOSIS — S0990XA Unspecified injury of head, initial encounter: Secondary | ICD-10-CM | POA: Diagnosis not present

## 2017-07-27 DIAGNOSIS — R269 Unspecified abnormalities of gait and mobility: Secondary | ICD-10-CM | POA: Diagnosis not present

## 2017-07-27 DIAGNOSIS — N400 Enlarged prostate without lower urinary tract symptoms: Secondary | ICD-10-CM | POA: Diagnosis present

## 2017-07-27 DIAGNOSIS — R270 Ataxia, unspecified: Secondary | ICD-10-CM | POA: Diagnosis not present

## 2017-07-27 DIAGNOSIS — Z7984 Long term (current) use of oral hypoglycemic drugs: Secondary | ICD-10-CM | POA: Diagnosis not present

## 2017-07-27 DIAGNOSIS — S72122D Displaced fracture of lesser trochanter of left femur, subsequent encounter for closed fracture with routine healing: Secondary | ICD-10-CM | POA: Diagnosis not present

## 2017-07-27 DIAGNOSIS — R9439 Abnormal result of other cardiovascular function study: Secondary | ICD-10-CM

## 2017-07-27 DIAGNOSIS — Z8673 Personal history of transient ischemic attack (TIA), and cerebral infarction without residual deficits: Secondary | ICD-10-CM

## 2017-07-27 DIAGNOSIS — I38 Endocarditis, valve unspecified: Secondary | ICD-10-CM | POA: Diagnosis not present

## 2017-07-27 DIAGNOSIS — I679 Cerebrovascular disease, unspecified: Secondary | ICD-10-CM | POA: Diagnosis not present

## 2017-07-27 DIAGNOSIS — I2510 Atherosclerotic heart disease of native coronary artery without angina pectoris: Secondary | ICD-10-CM | POA: Diagnosis present

## 2017-07-27 DIAGNOSIS — R51 Headache: Secondary | ICD-10-CM | POA: Diagnosis not present

## 2017-07-27 DIAGNOSIS — E1122 Type 2 diabetes mellitus with diabetic chronic kidney disease: Secondary | ICD-10-CM | POA: Diagnosis not present

## 2017-07-27 DIAGNOSIS — S199XXA Unspecified injury of neck, initial encounter: Secondary | ICD-10-CM | POA: Diagnosis not present

## 2017-07-27 DIAGNOSIS — R509 Fever, unspecified: Secondary | ICD-10-CM

## 2017-07-27 DIAGNOSIS — A4189 Other specified sepsis: Secondary | ICD-10-CM | POA: Diagnosis not present

## 2017-07-27 DIAGNOSIS — R404 Transient alteration of awareness: Secondary | ICD-10-CM | POA: Diagnosis not present

## 2017-07-27 DIAGNOSIS — R296 Repeated falls: Secondary | ICD-10-CM | POA: Diagnosis not present

## 2017-07-27 DIAGNOSIS — I330 Acute and subacute infective endocarditis: Secondary | ICD-10-CM | POA: Diagnosis not present

## 2017-07-27 DIAGNOSIS — I779 Disorder of arteries and arterioles, unspecified: Secondary | ICD-10-CM

## 2017-07-27 LAB — HEMOGLOBIN A1C
Hgb A1c MFr Bld: 6.6 % — ABNORMAL HIGH (ref 4.8–5.6)
Mean Plasma Glucose: 142.72 mg/dL

## 2017-07-27 LAB — GLUCOSE, CAPILLARY: Glucose-Capillary: 87 mg/dL (ref 65–99)

## 2017-07-27 LAB — BASIC METABOLIC PANEL
Anion gap: 11 (ref 5–15)
BUN: 19 mg/dL (ref 6–20)
CO2: 25 mmol/L (ref 22–32)
Calcium: 9 mg/dL (ref 8.9–10.3)
Chloride: 103 mmol/L (ref 101–111)
Creatinine, Ser: 1.38 mg/dL — ABNORMAL HIGH (ref 0.61–1.24)
GFR calc Af Amer: 51 mL/min — ABNORMAL LOW (ref >60)
GFR calc non Af Amer: 44 mL/min — ABNORMAL LOW (ref >60)
Glucose, Bld: 151 mg/dL — ABNORMAL HIGH (ref 65–99)
Potassium: 3.7 mmol/L (ref 3.5–5.1)
Sodium: 139 mmol/L (ref 135–145)

## 2017-07-27 LAB — URINALYSIS, ROUTINE W REFLEX MICROSCOPIC
Bacteria, UA: NONE SEEN
Bilirubin Urine: NEGATIVE
Glucose, UA: NEGATIVE mg/dL
Hgb urine dipstick: NEGATIVE
Ketones, ur: NEGATIVE mg/dL
Leukocytes, UA: NEGATIVE
Nitrite: NEGATIVE
Protein, ur: >=300 mg/dL — AB
Specific Gravity, Urine: 1.013 (ref 1.005–1.030)
Squamous Epithelial / LPF: NONE SEEN
pH: 8 (ref 5.0–8.0)

## 2017-07-27 LAB — CSF CELL COUNT WITH DIFFERENTIAL
RBC Count, CSF: 2050 /mm3 — ABNORMAL HIGH
RBC Count, CSF: 148 /mm3 — ABNORMAL HIGH
TUBE #: 1
Tube #: 4
WBC, CSF: 4 /mm3 (ref 0–5)
WBC, CSF: 0 /mm3 (ref 0–5)

## 2017-07-27 LAB — CBC WITH DIFFERENTIAL/PLATELET
Basophils Absolute: 0 10*3/uL (ref 0.0–0.1)
Basophils Relative: 0 %
Eosinophils Absolute: 0.1 10*3/uL (ref 0.0–0.7)
Eosinophils Relative: 1 %
HCT: 49.5 % (ref 39.0–52.0)
Hemoglobin: 15.9 g/dL (ref 13.0–17.0)
Lymphocytes Relative: 16 %
Lymphs Abs: 1.5 10*3/uL (ref 0.7–4.0)
MCH: 28.4 pg (ref 26.0–34.0)
MCHC: 32.1 g/dL (ref 30.0–36.0)
MCV: 88.6 fL (ref 78.0–100.0)
Monocytes Absolute: 0.7 10*3/uL (ref 0.1–1.0)
Monocytes Relative: 7 %
Neutro Abs: 7.2 10*3/uL (ref 1.7–7.7)
Neutrophils Relative %: 76 %
Platelets: 158 10*3/uL (ref 150–400)
RBC: 5.59 MIL/uL (ref 4.22–5.81)
RDW: 16.2 % — ABNORMAL HIGH (ref 11.5–15.5)
WBC: 9.5 10*3/uL (ref 4.0–10.5)

## 2017-07-27 LAB — INFLUENZA PANEL BY PCR (TYPE A & B)
Influenza A By PCR: NEGATIVE
Influenza B By PCR: NEGATIVE

## 2017-07-27 LAB — LACTIC ACID, PLASMA
Lactic Acid, Venous: 1.3 mmol/L (ref 0.5–1.9)
Lactic Acid, Venous: 1.4 mmol/L (ref 0.5–1.9)

## 2017-07-27 LAB — VITAMIN B12: VITAMIN B 12: 275 pg/mL (ref 180–914)

## 2017-07-27 LAB — GLUCOSE, CSF: Glucose, CSF: 84 mg/dL — ABNORMAL HIGH (ref 40–70)

## 2017-07-27 LAB — PROTEIN, CSF: Total  Protein, CSF: 55 mg/dL — ABNORMAL HIGH (ref 15–45)

## 2017-07-27 LAB — CBG MONITORING, ED: GLUCOSE-CAPILLARY: 113 mg/dL — AB (ref 65–99)

## 2017-07-27 LAB — TSH: TSH: 3.05 u[IU]/mL (ref 0.350–4.500)

## 2017-07-27 MED ORDER — DEXTROSE 5 % IV SOLN
1.0000 g | INTRAVENOUS | Status: DC
  Administered 2017-07-27: 1 g via INTRAVENOUS
  Filled 2017-07-27: qty 10

## 2017-07-27 MED ORDER — DEXTROSE 5 % IV SOLN
500.0000 mg | Freq: Two times a day (BID) | INTRAVENOUS | Status: DC
  Administered 2017-07-27: 500 mg via INTRAVENOUS
  Filled 2017-07-27 (×3): qty 10

## 2017-07-27 MED ORDER — HEPARIN SODIUM (PORCINE) 5000 UNIT/ML IJ SOLN
5000.0000 [IU] | Freq: Three times a day (TID) | INTRAMUSCULAR | Status: DC
  Administered 2017-07-27 – 2017-08-01 (×14): 5000 [IU] via SUBCUTANEOUS
  Filled 2017-07-27 (×14): qty 1

## 2017-07-27 MED ORDER — DEXTROSE 5 % IV SOLN
2.0000 g | INTRAVENOUS | Status: DC
  Administered 2017-07-27 – 2017-07-28 (×2): 2 g via INTRAVENOUS
  Filled 2017-07-27 (×6): qty 2

## 2017-07-27 MED ORDER — ACETAMINOPHEN 650 MG RE SUPP: 650.0000 mg | Freq: Once | RECTAL | Status: DC

## 2017-07-27 MED ORDER — STROKE: EARLY STAGES OF RECOVERY BOOK
Freq: Once | Status: DC
  Filled 2017-07-27 (×2): qty 1

## 2017-07-27 MED ORDER — ASPIRIN 300 MG RE SUPP
300.0000 mg | Freq: Every day | RECTAL | Status: DC
  Administered 2017-07-27: 300 mg via RECTAL
  Filled 2017-07-27 (×2): qty 1

## 2017-07-27 MED ORDER — ACYCLOVIR SODIUM 50 MG/ML IV SOLN
INTRAVENOUS | Status: AC
  Filled 2017-07-27: qty 10

## 2017-07-27 MED ORDER — UMECLIDINIUM BROMIDE 62.5 MCG/INH IN AEPB
1.0000 | INHALATION_SPRAY | Freq: Every day | RESPIRATORY_TRACT | Status: DC
  Administered 2017-07-28 – 2017-08-01 (×3): 1 via RESPIRATORY_TRACT
  Filled 2017-07-27 (×2): qty 7

## 2017-07-27 MED ORDER — ACETAMINOPHEN 500 MG PO TABS: 1000.0000 mg | ORAL_TABLET | Freq: Once | ORAL | Status: DC

## 2017-07-27 MED ORDER — FLUTICASONE FUROATE-VILANTEROL 100-25 MCG/INH IN AEPB
1.0000 | INHALATION_SPRAY | Freq: Every day | RESPIRATORY_TRACT | Status: DC
  Administered 2017-07-28 – 2017-08-01 (×3): 1 via RESPIRATORY_TRACT
  Filled 2017-07-27 (×2): qty 28

## 2017-07-27 MED ORDER — ACETAMINOPHEN 650 MG RE SUPP
650.0000 mg | Freq: Once | RECTAL | Status: AC
  Administered 2017-07-27: 650 mg via RECTAL

## 2017-07-27 MED ORDER — SODIUM CHLORIDE 0.9 % IV SOLN
Freq: Once | INTRAVENOUS | Status: AC
  Administered 2017-07-27: 17:00:00 via INTRAVENOUS

## 2017-07-27 MED ORDER — LIDOCAINE HCL (PF) 1 % IJ SOLN
5.0000 mL | Freq: Once | INTRAMUSCULAR | Status: AC
  Administered 2017-07-27: 5 mL
  Filled 2017-07-27: qty 6

## 2017-07-27 MED ORDER — LATANOPROST 0.005 % OP SOLN
1.0000 [drp] | Freq: Every day | OPHTHALMIC | Status: DC
  Administered 2017-07-27 – 2017-08-01 (×6): 1 [drp] via OPHTHALMIC
  Filled 2017-07-27: qty 2.5

## 2017-07-27 MED ORDER — VANCOMYCIN HCL 10 G IV SOLR
1250.0000 mg | INTRAVENOUS | Status: DC
  Filled 2017-07-27 (×2): qty 1250

## 2017-07-27 MED ORDER — HALOPERIDOL LACTATE 5 MG/ML IJ SOLN
5.0000 mg | Freq: Once | INTRAMUSCULAR | Status: AC
  Administered 2017-07-27: 5 mg via INTRAVENOUS
  Filled 2017-07-27: qty 1

## 2017-07-27 MED ORDER — DEXTROSE 5 % IV SOLN
10.0000 mg/kg | Freq: Two times a day (BID) | INTRAVENOUS | Status: DC
  Administered 2017-07-27: 860 mg via INTRAVENOUS
  Filled 2017-07-27 (×4): qty 17.2

## 2017-07-27 MED ORDER — LORAZEPAM 2 MG/ML IJ SOLN
1.0000 mg | Freq: Once | INTRAMUSCULAR | Status: AC
  Administered 2017-07-27: 1 mg via INTRAVENOUS
  Filled 2017-07-27: qty 1

## 2017-07-27 MED ORDER — BETAXOLOL HCL 0.5 % OP SOLN
1.0000 [drp] | OPHTHALMIC | Status: DC
  Administered 2017-07-27 – 2017-08-01 (×6): 1 [drp] via OPHTHALMIC
  Filled 2017-07-27: qty 5

## 2017-07-27 MED ORDER — ACETAMINOPHEN 325 MG PO TABS: 650.0000 mg | ORAL_TABLET | Freq: Four times a day (QID) | ORAL | Status: DC | PRN

## 2017-07-27 MED ORDER — NETARSUDIL DIMESYLATE 0.02 % OP SOLN: 1.0000 [drp] | Freq: Every day | OPHTHALMIC | Status: DC

## 2017-07-27 MED ORDER — TETRACAINE HCL 0.5 % OP SOLN
1.0000 [drp] | Freq: Once | OPHTHALMIC | Status: AC
  Administered 2017-07-27: 1 [drp] via OPHTHALMIC
  Filled 2017-07-27: qty 4

## 2017-07-27 MED ORDER — LORATADINE 10 MG PO TABS
10.0000 mg | ORAL_TABLET | Freq: Every day | ORAL | Status: DC
  Administered 2017-07-28 – 2017-08-01 (×4): 10 mg via ORAL
  Filled 2017-07-27 (×6): qty 1

## 2017-07-27 MED ORDER — CARVEDILOL 12.5 MG PO TABS
12.5000 mg | ORAL_TABLET | Freq: Two times a day (BID) | ORAL | Status: DC
  Administered 2017-07-28 – 2017-08-01 (×8): 12.5 mg via ORAL
  Filled 2017-07-27 (×9): qty 1

## 2017-07-27 MED ORDER — SODIUM CHLORIDE 0.9 % IV SOLN
INTRAVENOUS | Status: DC
  Administered 2017-07-27 – 2017-07-31 (×6): via INTRAVENOUS

## 2017-07-27 MED ORDER — INSULIN DETEMIR 100 UNIT/ML ~~LOC~~ SOLN
5.0000 [IU] | Freq: Every day | SUBCUTANEOUS | Status: DC
  Administered 2017-07-27 – 2017-07-31 (×5): 5 [IU] via SUBCUTANEOUS
  Filled 2017-07-27 (×6): qty 0.05

## 2017-07-27 MED ORDER — ACETAMINOPHEN 650 MG RE SUPP
RECTAL | Status: AC
  Filled 2017-07-27: qty 1

## 2017-07-27 MED ORDER — TAMSULOSIN HCL 0.4 MG PO CAPS
0.4000 mg | ORAL_CAPSULE | Freq: Every day | ORAL | Status: DC
  Administered 2017-07-28 – 2017-08-01 (×4): 0.4 mg via ORAL
  Filled 2017-07-27 (×6): qty 1

## 2017-07-27 MED ORDER — FLUTICASONE PROPIONATE 50 MCG/ACT NA SUSP
1.0000 | Freq: Every day | NASAL | Status: DC
  Administered 2017-07-28 – 2017-08-01 (×4): 1 via NASAL
  Filled 2017-07-27 (×5): qty 16

## 2017-07-27 MED ORDER — PANTOPRAZOLE SODIUM 40 MG PO TBEC
40.0000 mg | DELAYED_RELEASE_TABLET | Freq: Every day | ORAL | Status: DC
  Administered 2017-07-28 – 2017-08-01 (×4): 40 mg via ORAL
  Filled 2017-07-27 (×6): qty 1

## 2017-07-27 MED ORDER — INSULIN ASPART 100 UNIT/ML ~~LOC~~ SOLN
0.0000 [IU] | Freq: Three times a day (TID) | SUBCUTANEOUS | Status: DC
  Administered 2017-07-29 – 2017-08-01 (×6): 1 [IU] via SUBCUTANEOUS

## 2017-07-27 MED ORDER — FLUTICASONE-UMECLIDIN-VILANT 100-62.5-25 MCG/INH IN AEPB: 1.0000 | INHALATION_SPRAY | Freq: Every day | RESPIRATORY_TRACT | Status: DC

## 2017-07-27 MED ORDER — ACETAMINOPHEN 650 MG RE SUPP
650.0000 mg | Freq: Four times a day (QID) | RECTAL | Status: DC | PRN
  Administered 2017-07-27: 650 mg via RECTAL
  Filled 2017-07-27: qty 1

## 2017-07-27 MED ORDER — IPRATROPIUM-ALBUTEROL 0.5-2.5 (3) MG/3ML IN SOLN
3.0000 mL | Freq: Four times a day (QID) | RESPIRATORY_TRACT | Status: DC | PRN
  Administered 2017-07-29: 3 mL via RESPIRATORY_TRACT

## 2017-07-27 MED ORDER — PILOCARPINE HCL 2 % OP SOLN
1.0000 [drp] | Freq: Four times a day (QID) | OPHTHALMIC | Status: DC
  Administered 2017-07-28 – 2017-08-01 (×16): 1 [drp] via OPHTHALMIC
  Filled 2017-07-27: qty 15

## 2017-07-27 MED ORDER — ASPIRIN 325 MG PO TABS
325.0000 mg | ORAL_TABLET | Freq: Every day | ORAL | Status: DC
  Administered 2017-07-28: 325 mg via ORAL
  Filled 2017-07-27 (×4): qty 1

## 2017-07-27 MED ORDER — DORZOLAMIDE HCL 2 % OP SOLN
1.0000 [drp] | Freq: Two times a day (BID) | OPHTHALMIC | Status: DC
  Administered 2017-07-27 – 2017-08-01 (×11): 1 [drp] via OPHTHALMIC
  Filled 2017-07-27 (×2): qty 10

## 2017-07-27 MED ORDER — VANCOMYCIN HCL 10 G IV SOLR
2000.0000 mg | Freq: Once | INTRAVENOUS | Status: AC
  Administered 2017-07-27: 2000 mg via INTRAVENOUS
  Filled 2017-07-27: qty 2000

## 2017-07-27 NOTE — ED Notes (Signed)
Son in law called. He stated pt was a little slow after fall, answered questions appropriately at home but would say one thing and do another and that was not normal for him. He states pt is usually a/o. EDP aware

## 2017-07-27 NOTE — ED Notes (Signed)
Pt taken to ct 

## 2017-07-27 NOTE — ED Notes (Signed)
Pt taken to MRI  

## 2017-07-27 NOTE — ED Notes (Signed)
EDP in with pt and family.

## 2017-07-27 NOTE — Progress Notes (Signed)
Pharmacy Antibiotic Note  Anthony Murray is a 84 y.o. male admitted on 07/27/2017 with r/o meningitis .  Pharmacy has been consulted for Vancomycin dosing.  Plan: Vancomycin 2000 mg IV OT Vancomycin 1250 mg IV every 24 hours.  Goal trough 15-20 mcg/mL.  Monitor labs, cultures, trough as needed, and lumbar puncture results  Weight: 190 lb (86.2 kg)  Temp (24hrs), Avg:100.5 F (38.1 C), Min:99.5 F (37.5 C), Max:101.3 F (38.5 C)  Recent Labs  Lab 07/27/17 1028 07/27/17 1242  WBC 9.5  --   CREATININE 1.38*  --   LATICACIDVEN 1.3 1.4    Estimated Creatinine Clearance: 39.2 mL/min (A) (by C-G formula based on SCr of 1.38 mg/dL (H)).    Allergies  Allergen Reactions  . Codeine   . Ezetimibe-Simvastatin     REACTION: myalgias  . Naproxen     REACTION: and all nonsteroidals  . Niacin   . Pravastatin Sodium     REACTION: myalgias no    Antimicrobials this admission: Vanco 2/6 >>  Rocephin 2/6 >>  Acyclovir 2/6>>  Dose adjustments this admission: N/A  Microbiology results: 2/6 BCx: pending 2/6 urine: pending CSF culture: pending  Thank you for allowing pharmacy to be a part of this patient's care.   Margot Ables, PharmD Clinical Pharmacist 07/27/2017 3:37 PM

## 2017-07-27 NOTE — ED Provider Notes (Signed)
[facility] EMERGENCY DEPARTMENT Provider Note   CSN: [id number] Arrival date & time: 07/27/17  0707     History   Chief Complaint Chief Complaint  Patient presents with  . Fall    HPI Anthony Murray is a 84 y.o. male.  HPI   84 year old male presenting after a fall.  Patient states that he fell twice today.  He thinks that he slipped in his "slick shoes."  He did strike the back of his head.  No loss of consciousness.  He is complaining of some pain where he struck his head and also some right eye pain.  No change in visual acuity.  Denies any nausea.  No acute numbness, tingling or focal loss of strength.  No blood thinners.  Past Medical History:  Diagnosis Date  . Aortic valve stenosis   . ASCVD (arteriosclerotic cardiovascular disease)    50% ostial left left anterior desending 60% mid circumflex and normal ejection fractioon in 2006 exertional dyspnea;history of chest discomfort  . Cerebrovascular disease    minimal carotid bruits  . CKD (chronic kidney disease)   . Diabetes mellitus type 2, controlled ([place])    no insulin  . Hyperlipidemia    statin intolerant  . Hypertension   . Tobacco abuse, in remission    30 pack years stopped in 1971    Patient Active Problem List   Diagnosis Date Noted  . Urinary retention 02/14/2017  . Closed displaced subtrochanteric fracture of left femur ([place]) 02/03/2017  . Sepsis ([place]) 01/25/2016  . UTI (lower urinary tract infection) 01/24/2016  . Aortic stenosis 10/19/2012  . CKD (chronic kidney disease), stage III ([place]) 10/10/2011  . Peripheral vascular disease ([place]) 10/10/2011  . Tobacco abuse, in remission   . Diabetes mellitus type 2, controlled ([place])   . Arteriosclerotic cardiovascular disease (ASCVD) 08/28/2009  . CEREBROVASCULAR DISEASE 08/28/2009  . HYPERLIPIDEMIA 12/09/2008  . Essential hypertension 12/09/2008    Past Surgical History:  Procedure Laterality Date  . APPENDECTOMY    . CARPAL TUNNEL RELEASE     surgery left 05/05/2001  . KNEE ARTHROSCOPY     bilaterial; unknown associated surgical procedure  . SHOULDER SURGERY     Right x2; third procedure on left  . TONSILLECTOMY    . TRANSURETHRAL INCISION OF PROSTATE     resection of the prostate       Home Medications    Prior to Admission medications   Medication Sig Start Date End Date Taking? Authorizing Provider  acetaminophen (TYLENOL) 325 MG tablet Take 650 mg by mouth every 4 (four) hours as needed.    [provider]  albuterol (PROVENTIL HFA;VENTOLIN HFA) 108 (90 Base) MCG/ACT inhaler Inhale 2 puffs into the lungs every 4 (four) hours as needed for wheezing or shortness of breath.     [provider]  aspirin 81 MG tablet Take 81 mg by mouth 2 (two) times daily.     [provider]  betaxolol (BETOPTIC-S) 0.5 % ophthalmic suspension Place 1 drop into both eyes every morning.  12/15/15   [provider]  bimatoprost (LUMIGAN) 0.01 % SOLN Place 1 drop into both eyes daily.    [provider]  budesonide-formoterol (SYMBICORT) 160-4.5 MCG/ACT inhaler Inhale 2 puffs into the lungs 2 (two) times daily.    [provider]  calcium citrate (CALCITRATE - DOSED IN MG ELEMENTAL CALCIUM) 950 MG tablet Take 200 mg of elemental calcium by mouth daily.    [provider]  carvedilol (COREG) 12.5 MG tablet Take 12.5 mg by mouth 2 (two) times daily with a meal.  09/28/12   [provider]  Cholecalciferol 1000 units tablet Take 1,000 Units by mouth daily.    [provider]  dorzolamide (TRUSOPT) 2 % ophthalmic solution Place 1 drop into the left eye 2 (two) times daily.      [provider]  glipiZIDE (GLUCOTROL) 5 MG tablet Take 5 mg by mouth daily before breakfast.    [provider]  insulin lispro (HUMALOG) 100 UNIT/ML cartridge 3 (three) times daily with meals. Per sliding scale    [provider]  iron polysaccharides (NIFEREX) 150 MG  capsule Take 150 mg by mouth daily.    [provider]  pantoprazole (PROTONIX) 40 MG tablet Take 40 mg by mouth daily.    [provider]  pilocarpine (PILOCAR) 2 % ophthalmic solution Place 1 drop into the left eye 4 (four) times daily.     [provider]  sennosides-docusate sodium (SENOKOT-S) 8.6-50 MG tablet Take 2 tablets by mouth 2 (two) times daily.    [provider]  sertraline (ZOLOFT) 25 MG tablet Take 25 mg by mouth daily.    [provider]  sitaGLIPtin (JANUVIA) 50 MG tablet Take 50 mg by mouth daily.    [provider]  tamsulosin (FLOMAX) 0.4 MG CAPS capsule Take 0.4 mg by mouth daily.    [provider]  traMADol (ULTRAM) 50 MG tablet Take 50 mg by mouth every 6 (six) hours as needed.    [provider]  vitamin C (ASCORBIC ACID) 500 MG tablet Take 500 mg by mouth 3 (three) times daily.     [provider]    Family History No family history on file.  Social History Social History   Tobacco Use  . Smoking status: Former Smoker    Packs/day: 1.00    Years: 40.00    Pack years: 40.00    Types: Cigarettes    Last attempt to quit: 10/29/1969    Years since quitting: 47.7  . Smokeless tobacco: Never Used  Substance Use Topics  . Alcohol use: No    Alcohol/week: 0.0 oz  . Drug use: No     Allergies   Codeine; Ezetimibe-simvastatin; Naproxen; Niacin; and Pravastatin sodium   Review of Systems Review of Systems  All systems reviewed and negative, other than as noted in HPI.  Physical Exam Updated Vital Signs BP (!) 153/70   Pulse 86   Temp 99.5 F (37.5 C) (Oral)   Resp 19   SpO2 97%   Physical Exam  Constitutional: He is oriented to person, place, and time. He appears well-developed and well-nourished. No distress.  Somewhat restless like he cannot get comfortable in bed. Doesn't seem distressed though.   HENT:  Head: Normocephalic and atraumatic.  Some mild tenderness  in posterior scalp but I do not appreciate heme.  No midline spinal tenderness.  Eyes: Conjunctivae and EOM are normal. Right eye exhibits no discharge. Left eye exhibits no discharge.  IOP: OD 24, OS 43, 48.  Neck: Neck supple.  No nuchal rigidity  Cardiovascular: Normal rate, regular rhythm and normal heart sounds. Exam reveals no gallop and no friction rub.  No murmur heard. Pulmonary/Chest: Effort normal and breath sounds normal. No respiratory distress.  Abdominal: Soft. He exhibits no distension. There is no tenderness.  Musculoskeletal: He exhibits no edema or tenderness.  No apparent pain with range of motion of  the large joints or bony tenderness to extremities.  Neurological: He is oriented to person, place, and time. He exhibits normal muscle tone.  Oriented x3.  Speech is clear.  Content is appropriate although somewhat slow to respond.  Mild anisocoria.  Right pupil is approximately 3 mm and the left is about 2 mm.  Both reactive. Hard of hearing. Cranial nerves II through XII otherwise intact.  Strength is 5 out of 5 bilateral upper and lower extremities.  Skin: Skin is warm and dry.  Nursing note and vitals reviewed.    ED Treatments / Results  Labs (all labs ordered are listed, but only abnormal results are displayed) Labs Reviewed  URINALYSIS, ROUTINE W REFLEX MICROSCOPIC - Abnormal; Notable for the following components:      Result Value   Protein, ur >=300 (*)    All other components within normal limits  CBC WITH DIFFERENTIAL/PLATELET - Abnormal; Notable for the following components:   RDW 16.2 (*)    All other components within normal limits  BASIC METABOLIC PANEL - Abnormal; Notable for the following components:   Glucose, Bld 151 (*)    Creatinine, Ser 1.38 (*)    GFR calc non Af Amer 44 (*)    GFR calc Af Amer 51 (*)    All other components within normal limits  CSF CELL COUNT WITH DIFFERENTIAL - Abnormal; Notable for the following components:   Color, CSF  STRAW (*)    Appearance, CSF CLOUDY (*)    RBC Count, CSF 2,050 (*)    All other components within normal limits  GLUCOSE, CSF - Abnormal; Notable for the following components:   Glucose, CSF 84 (*)    All other components within normal limits  PROTEIN, CSF - Abnormal; Notable for the following components:   Total  Protein, CSF 55 (*)    All other components within normal limits  CSF CELL COUNT WITH DIFFERENTIAL - Abnormal; Notable for the following components:   RBC Count, CSF 148 (*)    All other components within normal limits  HEMOGLOBIN A1C - Abnormal; Notable for the following components:   Hgb A1c MFr Bld 6.6 (*)    All other components within normal limits  CBC - Abnormal; Notable for the following components:   RDW 16.4 (*)    Platelets 130 (*)    All other components within normal limits  BASIC METABOLIC PANEL - Abnormal; Notable for the following components:   Potassium 3.3 (*)    Creatinine, Ser 1.41 (*)    Calcium 8.0 (*)    GFR calc non Af Amer 43 (*)    GFR calc Af Amer 50 (*)    All other components within normal limits  LIPID PANEL - Abnormal; Notable for the following components:   Triglycerides 156 (*)    HDL 25 (*)    LDL Cholesterol 108 (*)    All other components within normal limits  CBG MONITORING, ED - Abnormal; Notable for the following components:   Glucose-Capillary 113 (*)    All other components within normal limits  CULTURE, BLOOD (ROUTINE X 2)  CULTURE, BLOOD (ROUTINE X 2)  CSF CULTURE  MRSA PCR SCREENING  URINE CULTURE  INFLUENZA PANEL BY PCR (TYPE A & B)  LACTIC ACID, PLASMA  LACTIC ACID, PLASMA  TSH  VITAMIN B12  RPR  HIV ANTIBODY (ROUTINE TESTING)  GLUCOSE, CAPILLARY  GLUCOSE, CAPILLARY    EKG  EKG Interpretation None       Radiology  Dg Chest 2 View  Result Date: 07/27/2017 CLINICAL DATA:  Altered mental status EXAM: CHEST  2 VIEW COMPARISON:  02/02/2017 FINDINGS: Cardiac shadow is mildly enlarged but stable. Aortic  calcifications are again seen and stable. The lungs are well aerated bilaterally. No focal infiltrate or sizable effusion is seen. No acute bony abnormality is noted. IMPRESSION: No acute abnormality noted. Electronically Signed   By: Inez Catalina M.D.   On: 07/27/2017 10:01   Ct Head Wo Contrast  Result Date: 07/27/2017 CLINICAL DATA:  Pain following fall EXAM: CT HEAD WITHOUT CONTRAST CT CERVICAL SPINE WITHOUT CONTRAST TECHNIQUE: Multidetector CT imaging of the head and cervical spine was performed following the standard protocol without intravenous contrast. Multiplanar CT image reconstructions of the cervical spine were also generated. COMPARISON:  Head CT February 02, 2017 FINDINGS: CT HEAD FINDINGS Brain: There is moderate diffuse atrophy, stable. There is no appreciable intracranial mass, hemorrhage, extra-axial fluid collection, or midline shift. There is extensive small vessel disease in the centra semiovale bilaterally. Small vessel disease is noted in each internal and external capsule. No acute infarct is demonstrable. Vascular: There is no hyperdense vessel appreciable. There is calcification in each carotid siphon region and in each distal vertebral artery. Skull: Bony calvarium appears intact. Sinuses/Orbits: There is opacification multiple ethmoid air cells. There is mucosal thickening in each visualized maxillary antrum. There is opacification in the posterior right sphenoid sinus as well. Orbits appear symmetric bilaterally. Other: Mastoid air cells are clear. CT CERVICAL SPINE FINDINGS Alignment: There is 3 mm of anterolisthesis of C6 on C7. There is 1 mm of retrolisthesis of C5 on C6. No other appreciable spondylolisthesis. Skull base and vertebrae: The skull base and craniocervical junction regions appear normal. There is no evident acute fracture. There are no blastic or lytic bone lesions. Soft tissues and spinal canal: Prevertebral soft tissues and predental space regions are normal. No  paraspinous lesion. No cord or canal hematoma evident. Disc levels: There is moderately severe disc space narrowing at C3-4 and C5-6. There is moderate disc space narrowing at C4-5, C6-7, and C7-T1. There is multifocal facet osteoarthritic change. There is exit foraminal narrowing at multiple levels due to bony overgrowth. No frank disc extrusion or stenosis is evident. Upper chest: Visualized upper lung zone regions are clear. Other: There is bilateral carotid artery calcification. IMPRESSION: CT head: Atrophy with supratentorial small vessel disease. No acute infarct. No mass or hemorrhage. There are foci of arterial vascular calcification. There are foci of paranasal sinus disease. CT cervical spine: No fracture. Areas of mild spondylolisthesis are felt to be due to underlying spondylosis. There is multilevel osteoarthritic change with exit foraminal narrowing at multiple levels. There is carotid artery calcification bilaterally. Electronically Signed   By: Lowella Grip III M.D.   On: 07/27/2017 09:26   Ct Cervical Spine Wo Contrast  Result Date: 07/27/2017 CLINICAL DATA:  Pain following fall EXAM: CT HEAD WITHOUT CONTRAST CT CERVICAL SPINE WITHOUT CONTRAST TECHNIQUE: Multidetector CT imaging of the head and cervical spine was performed following the standard protocol without intravenous contrast. Multiplanar CT image reconstructions of the cervical spine were also generated. COMPARISON:  Head CT February 02, 2017 FINDINGS: CT HEAD FINDINGS Brain: There is moderate diffuse atrophy, stable. There is no appreciable intracranial mass, hemorrhage, extra-axial fluid collection, or midline shift. There is extensive small vessel disease in the centra semiovale bilaterally. Small vessel disease is noted in each internal and external capsule. No acute infarct is demonstrable. Vascular: There is no  hyperdense vessel appreciable. There is calcification in each carotid siphon region and in each distal vertebral artery.  Skull: Bony calvarium appears intact. Sinuses/Orbits: There is opacification multiple ethmoid air cells. There is mucosal thickening in each visualized maxillary antrum. There is opacification in the posterior right sphenoid sinus as well. Orbits appear symmetric bilaterally. Other: Mastoid air cells are clear. CT CERVICAL SPINE FINDINGS Alignment: There is 3 mm of anterolisthesis of C6 on C7. There is 1 mm of retrolisthesis of C5 on C6. No other appreciable spondylolisthesis. Skull base and vertebrae: The skull base and craniocervical junction regions appear normal. There is no evident acute fracture. There are no blastic or lytic bone lesions. Soft tissues and spinal canal: Prevertebral soft tissues and predental space regions are normal. No paraspinous lesion. No cord or canal hematoma evident. Disc levels: There is moderately severe disc space narrowing at C3-4 and C5-6. There is moderate disc space narrowing at C4-5, C6-7, and C7-T1. There is multifocal facet osteoarthritic change. There is exit foraminal narrowing at multiple levels due to bony overgrowth. No frank disc extrusion or stenosis is evident. Upper chest: Visualized upper lung zone regions are clear. Other: There is bilateral carotid artery calcification. IMPRESSION: CT head: Atrophy with supratentorial small vessel disease. No acute infarct. No mass or hemorrhage. There are foci of arterial vascular calcification. There are foci of paranasal sinus disease. CT cervical spine: No fracture. Areas of mild spondylolisthesis are felt to be due to underlying spondylosis. There is multilevel osteoarthritic change with exit foraminal narrowing at multiple levels. There is carotid artery calcification bilaterally. Electronically Signed   By: Lowella Grip III M.D.   On: 07/27/2017 09:26   Mr Jodene Nam Head Wo Contrast  Result Date: 07/27/2017 CLINICAL DATA:  Altered mental status.  Fall. EXAM: MRI HEAD WITHOUT CONTRAST MRA HEAD WITHOUT CONTRAST TECHNIQUE:  Multiplanar, multiecho pulse sequences of the brain and surrounding structures were obtained without intravenous contrast. Angiographic images of the head were obtained using MRA technique without contrast. COMPARISON:  Head CT 07/27/2017 FINDINGS: MRI HEAD FINDINGS The examination had to be discontinued prior to completion due to patient's mental status and inability to tolerate the examination despite receiving medication. Axial and coronal diffusion, sagittal T1, and axial T2 sequences were obtained and are moderately motion degraded. Brain: Small foci of acute cortical/subcortical infarction are noted in the right frontal and right occipital lobes, and there is also a small infarct in the posterior right centrum semiovale. No gross intracranial hemorrhage, mass/mass effect, or extra-axial fluid collection is identified. There is advanced cerebral atrophy. Chronic lacunar infarcts are noted in the deep cerebral white matter bilaterally as well as likely in the right pons. Confluent T2 hyperintensity throughout the cerebral white matter is nonspecific but compatible with extensive chronic small vessel ischemic disease. Vascular: Major intracranial vascular flow voids are preserved. Skull and upper cervical spine: Grossly unremarkable bone marrow signal. Moderate pannus about the dens resulting in mild upper cervical spinal stenosis but without evidence of cord compression. Sinuses/Orbits: Bilateral cataract extraction. Trace right sphenoid sinus fluid. Clear mastoid air cells. Other: None. MRA HEAD FINDINGS The study is moderately to severely motion degraded. The vertebral arteries are patent to the basilar with the left being dominant. The basilar artery is patent without gross high-grade stenosis. The proximal right P1 segment is patent, however there is loss of flow related enhancement in the distal P1 segment which is somewhat asymmetric compared to the contralateral side and which could reflect artifactual  signal loss versus  occlusion or high-grade stenosis. The left P1 segment is grossly patent with presumably artifactual signal loss of the P2 and more distal PCA branches. The internal carotid arteries are patent from skull base to carotid termini with limited assessment for stenosis due to artifact. The proximal to mid M1 segments are patent bilaterally, however severe artifact limits assessment of the MCA bifurcations and branch vessels. The right A1 segment in both A2 segments are grossly patent. The left A1 segment is not well seen, potentially small and obscured by motion. IMPRESSION: 1. Motion degraded, incomplete examination. 2. Scattered small acute cortical and white matter infarcts in the right cerebral hemisphere. 3. Advanced chronic small vessel ischemic disease and cerebral atrophy. 4. Severely motion degraded head MRA as above. Electronically Signed   By: Logan Bores M.D.   On: 07/27/2017 12:22   Mr Brain Wo Contrast  Result Date: 07/27/2017 CLINICAL DATA:  Altered mental status.  Fall. EXAM: MRI HEAD WITHOUT CONTRAST MRA HEAD WITHOUT CONTRAST TECHNIQUE: Multiplanar, multiecho pulse sequences of the brain and surrounding structures were obtained without intravenous contrast. Angiographic images of the head were obtained using MRA technique without contrast. COMPARISON:  Head CT 07/27/2017 FINDINGS: MRI HEAD FINDINGS The examination had to be discontinued prior to completion due to patient's mental status and inability to tolerate the examination despite receiving medication. Axial and coronal diffusion, sagittal T1, and axial T2 sequences were obtained and are moderately motion degraded. Brain: Small foci of acute cortical/subcortical infarction are noted in the right frontal and right occipital lobes, and there is also a small infarct in the posterior right centrum semiovale. No gross intracranial hemorrhage, mass/mass effect, or extra-axial fluid collection is identified. There is advanced  cerebral atrophy. Chronic lacunar infarcts are noted in the deep cerebral white matter bilaterally as well as likely in the right pons. Confluent T2 hyperintensity throughout the cerebral white matter is nonspecific but compatible with extensive chronic small vessel ischemic disease. Vascular: Major intracranial vascular flow voids are preserved. Skull and upper cervical spine: Grossly unremarkable bone marrow signal. Moderate pannus about the dens resulting in mild upper cervical spinal stenosis but without evidence of cord compression. Sinuses/Orbits: Bilateral cataract extraction. Trace right sphenoid sinus fluid. Clear mastoid air cells. Other: None. MRA HEAD FINDINGS The study is moderately to severely motion degraded. The vertebral arteries are patent to the basilar with the left being dominant. The basilar artery is patent without gross high-grade stenosis. The proximal right P1 segment is patent, however there is loss of flow related enhancement in the distal P1 segment which is somewhat asymmetric compared to the contralateral side and which could reflect artifactual signal loss versus occlusion or high-grade stenosis. The left P1 segment is grossly patent with presumably artifactual signal loss of the P2 and more distal PCA branches. The internal carotid arteries are patent from skull base to carotid termini with limited assessment for stenosis due to artifact. The proximal to mid M1 segments are patent bilaterally, however severe artifact limits assessment of the MCA bifurcations and branch vessels. The right A1 segment in both A2 segments are grossly patent. The left A1 segment is not well seen, potentially small and obscured by motion. IMPRESSION: 1. Motion degraded, incomplete examination. 2. Scattered small acute cortical and white matter infarcts in the right cerebral hemisphere. 3. Advanced chronic small vessel ischemic disease and cerebral atrophy. 4. Severely motion degraded head MRA as above.  Electronically Signed   By: Logan Bores M.D.   On: 07/27/2017 12:22  Procedures Procedures (including critical care time)  Medications Ordered in ED Medications - No data to display   Initial Impression / Assessment and Plan / ED Course  I have reviewed the triage vital signs and the nursing notes.  Pertinent labs & imaging results that were available during my care of the patient were reviewed by me and considered in my medical decision making (see chart for details).     84 year old male presenting after fall.  Did strike his head.  No loss of consciousness.  States he does have a mild headache and right eye pain.  Pupils are somewhat irregular.  He denies past history of anisocoria.  He has no acute change in visual acuity or diplopia.  Extraocular muscle function appears to be normal.  Will CT his head.  Given his age also check urinalysis and EKG.  Workup thus far fairly unremarkable.  He has become increasingly agitated while in the emergency room though.  Rectal temperature is 101.3.  No clear source at this time.  He does have increased intraocular pressure. He wasn't most cooperative with testing but numbers likley real with hx of glaucoma and has taken meds today.Marland Kitchen  His home medicines were ordered.  This does not explain the fever or change in mental status though.  He does not appear to have any nuchal rigidity, but will LP.  MRI.  Admission.  12:34 PM LP attempted. He actually tolerated it very well but I was unable to obtain any CSF.   Final Clinical Impressions(s) / ED Diagnoses   Final diagnoses:    Fever, unspecified  Acute encephalopathy  Acute CVA (cerebrovascular accident) [facility])    ED Discharge Orders    None      Virgel Manifold, MD 07/28/17 1003

## 2017-07-27 NOTE — ED Notes (Signed)
cbg 167 per ems  

## 2017-07-27 NOTE — CV Procedure (Signed)
Under flouro guidance, lumbar puncture performed at L3-4. No immediate complications.

## 2017-07-27 NOTE — ED Notes (Signed)
Pt taken to IR for LP. Pt still restful

## 2017-07-27 NOTE — ED Notes (Signed)
Pharmacy aware of eye drops

## 2017-07-27 NOTE — H&P (Addendum)
History and Physical    Anthony Murray HBZ:[id number] DOB: Apr 26, 1928 DOA: 07/27/2017  PCP: [name], MD   I have briefly reviewed patients previous medical reports in [facility].  Patient coming from: Home  Chief Complaint: Mechanical fall, AMS, HA and Fever  HPI: Anthony Murray is a 84 year old male with a past medical history significant for chronic kidney disease stage III, type 2 diabetes, hypertension, BPH, GERD, glaucoma and COPD; who presented to the emergency department secondary to fever, mechanical fall, changes in mentation and some headache.  Per family patient was doing great until the night prior to admission and then suddenly very early in the morning after he woke up he experienced to episodes back to back of falling with the last one hitting his head; he has been complaining of feeling and some upper respiratory infection symptoms with some ongoing congestion and a recent use of Mucinex DM to try to break up loose some phlegm. No CP, no hemoptysis, no complaints of dysuria, nausea, vomiting or any other complaints.  After the second fall he reports having some headaches and also some eye discomfort.   Of note during my evaluation patient was a status post benzodiazepines and Haldol in order to assist with ongoing agitation and to allow patient to have an MRI of the brain.  ED Course: Patient with elevated fever, MRI motion degraded but suggesting some acute/subacute cortical and white matter disease.  Patient CT scan without any acute abnormalities. LP done and cultures taken. Patient started on broad spectrum antibiotics.   Review of Systems:  All other systems reviewed and apart from HPI, are negative.  Past Medical History:  Diagnosis Date  . Aortic valve stenosis   . ASCVD (arteriosclerotic cardiovascular disease)    50% ostial left left anterior desending 60% mid circumflex and normal ejection fractioon in 2006 exertional dyspnea;history of chest discomfort    . Cerebrovascular disease    minimal carotid bruits  . CKD (chronic kidney disease)   . Diabetes mellitus type 2, controlled ([place])    no insulin  . Hyperlipidemia    statin intolerant  . Hypertension   . Tobacco abuse, in remission    30 pack years stopped in 1971    Past Surgical History:  Procedure Laterality Date  . APPENDECTOMY    . CARPAL TUNNEL RELEASE     surgery left 05/05/2001  . KNEE ARTHROSCOPY     bilaterial; unknown associated surgical procedure  . SHOULDER SURGERY     Right x2; third procedure on left  . TONSILLECTOMY    . TRANSURETHRAL INCISION OF PROSTATE     resection of the prostate    Social History  reports that he quit smoking about 47 years ago. His smoking use included cigarettes. He has a 40.00 pack-year smoking history. he has never used smokeless tobacco. He reports that he does not drink alcohol or use drugs.  Allergies  Allergen Reactions  . Codeine   . Ezetimibe-Simvastatin     REACTION: myalgias  . Naproxen     REACTION: and all nonsteroidals  . Niacin   . Pravastatin Sodium     REACTION: myalgias no    Family Hx: positive for HTN and DM, otherwise no contributory by family member report. Patient unable to answer questions in current mental status.  Prior to Admission medications   Medication Sig Start Date End Date Taking? Authorizing Provider  acetaminophen (TYLENOL) 325 MG tablet Take 650 mg by mouth every 4 (  four) hours as needed.   Yes [provider]  albuterol (PROVENTIL HFA;VENTOLIN HFA) 108 (90 Base) MCG/ACT inhaler Inhale 2 puffs into the lungs every 4 (four) hours as needed for wheezing or shortness of breath.    Yes [provider]  aspirin 81 MG tablet Take 81 mg by mouth 2 (two) times daily.    Yes [provider]  betaxolol (BETOPTIC-S) 0.5 % ophthalmic suspension Place 1 drop into both eyes every morning.  12/15/15  Yes [provider]  bimatoprost (LUMIGAN) 0.01 % SOLN Place 1 drop  into both eyes daily.   Yes [provider]  carvedilol (COREG) 12.5 MG tablet Take 12.5 mg by mouth 2 (two) times daily with a meal.  09/28/12  Yes [provider]  Cholecalciferol 1000 units tablet Take 1,000 Units by mouth daily.   Yes [provider]  Cranberry 500 MG CAPS Take 1 capsule by mouth daily.   Yes [provider]  dorzolamide (TRUSOPT) 2 % ophthalmic solution Place 1 drop into the left eye 2 (two) times daily.     Yes [provider]  Fluticasone-Umeclidin-Vilant (TRELEGY ELLIPTA) 100-62.5-25 MCG/INH AEPB Inhale 1 puff into the lungs daily.   Yes [provider]  furosemide (LASIX) 20 MG tablet Take 20 mg by mouth daily.   Yes [provider]  glipiZIDE (GLUCOTROL) 5 MG tablet Take 5 mg by mouth daily before breakfast.   Yes [provider]  iron polysaccharides (NIFEREX) 150 MG capsule Take 150 mg by mouth daily.   Yes [provider]  Netarsudil Dimesylate (RHOPRESSA) 0.02 % SOLN Apply 1 drop to eye at bedtime. Left eye   Yes [provider]  pantoprazole (PROTONIX) 40 MG tablet Take 40 mg by mouth daily.   Yes [provider]  pilocarpine (PILOCAR) 2 % ophthalmic solution Place 1 drop into the left eye 4 (four) times daily.    Yes [provider]  potassium chloride (K-DUR) 10 MEQ tablet Take 10 mEq by mouth daily.   Yes [provider]  sitaGLIPtin (JANUVIA) 50 MG tablet Take 50 mg by mouth daily.   Yes [provider]  tamsulosin (FLOMAX) 0.4 MG CAPS capsule Take 0.4 mg by mouth daily.   Yes [provider]  traMADol (ULTRAM) 50 MG tablet Take 50 mg by mouth every 6 (six) hours as needed.   Yes [provider]    Physical Exam: Vitals:   07/27/17 1303 07/27/17 1305 07/27/17 1330 07/27/17 1400  BP: (!) 145/88  138/81 138/75  Pulse: 90  85 90  Resp: 18  15 (!) 23  Temp:      TempSrc:      SpO2: 93%  93% 94%  Weight:  86.2 kg (190  lb)     Constitutional: warm to touch, unable to answer questions or follow commands due to lethargy. Breathing stable and airway protected. Eyes: PERTLA, lids and conjunctivae normal. No icterus. ENMT: Mucous membranes dry on exam; poor dentition, no thrush. Posterior pharynx clear of any exudate or lesions.  Neck: supple, no masses, no thyromegaly, no lymphadenopathy  Respiratory: good air movement bilaterally, no wheezing, no crackles. Positive scattered rhonchi. No accessory muscle use.  Cardiovascular: S1 & S2 heard, regular rate and rhythm, no rubs / gallops. No extremity edema. 2+ pedal pulses. No carotid bruits.  Abdomen: No distension, no tenderness, no masses palpated. No hepatosplenomegaly. Bowel sounds normal.  Musculoskeletal: no clubbing / cyanosis. No joint deformity upper and lower  extremities. Good ROM, no contractures appreciated.. Normal muscle tone.  Skin: no open wounds. Some chronic changes from stasis dermatitis seen on his LE, but no significant edema currently.  Neurologic: unable to properly assess due to current state of mentation. Moving four limbs.  Psychiatric: patient was confused and agitated; received lorazepam and haldol; no is somnolent/lethargic and unable to follow commands or answer questions.  Labs on Admission: I have personally reviewed following labs and imaging studies  CBC: Recent Labs  Lab 07/27/17 1028  WBC 9.5  NEUTROABS 7.2  HGB 15.9  HCT 49.5  MCV 88.6  PLT 841   Basic Metabolic Panel: Recent Labs  Lab 07/27/17 1028  NA 139  K 3.7  CL 103  CO2 25  GLUCOSE 151*  BUN 19  CREATININE 1.38*  CALCIUM 9.0   Urine analysis:    Component Value Date/Time   COLORURINE YELLOW 07/27/2017 [place] 07/27/2017 0718   LABSPEC 1.013 07/27/2017 0718   PHURINE 8.0 07/27/2017 0718   GLUCOSEU NEGATIVE 07/27/2017 0718   HGBUR NEGATIVE 07/27/2017 0718   BILIRUBINUR NEGATIVE 07/27/2017 0718   KETONESUR NEGATIVE 07/27/2017 0718    PROTEINUR >=300 (A) 07/27/2017 0718   NITRITE NEGATIVE 07/27/2017 0718   LEUKOCYTESUR NEGATIVE 07/27/2017 0718    Radiological Exams on Admission: Dg Chest 2 View  Result Date: 07/27/2017 CLINICAL DATA:  Altered mental status EXAM: CHEST  2 VIEW COMPARISON:  02/02/2017 FINDINGS: Cardiac shadow is mildly enlarged but stable. Aortic calcifications are again seen and stable. The lungs are well aerated bilaterally. No focal infiltrate or sizable effusion is seen. No acute bony abnormality is noted. IMPRESSION: No acute abnormality noted. Electronically Signed   By: Inez Catalina M.D.   On: 07/27/2017 10:01   Ct Head Wo Contrast  Result Date: 07/27/2017 CLINICAL DATA:  Pain following fall EXAM: CT HEAD WITHOUT CONTRAST CT CERVICAL SPINE WITHOUT CONTRAST TECHNIQUE: Multidetector CT imaging of the head and cervical spine was performed following the standard protocol without intravenous contrast. Multiplanar CT image reconstructions of the cervical spine were also generated. COMPARISON:  Head CT February 02, 2017 FINDINGS: CT HEAD FINDINGS Brain: There is moderate diffuse atrophy, stable. There is no appreciable intracranial mass, hemorrhage, extra-axial fluid collection, or midline shift. There is extensive small vessel disease in the centra semiovale bilaterally. Small vessel disease is noted in each internal and external capsule. No acute infarct is demonstrable. Vascular: There is no hyperdense vessel appreciable. There is calcification in each carotid siphon region and in each distal vertebral artery. Skull: Bony calvarium appears intact. Sinuses/Orbits: There is opacification multiple ethmoid air cells. There is mucosal thickening in each visualized maxillary antrum. There is opacification in the posterior right sphenoid sinus as well. Orbits appear symmetric bilaterally. Other: Mastoid air cells are clear. CT CERVICAL SPINE FINDINGS Alignment: There is 3 mm of anterolisthesis of C6 on C7. There is 1 mm of  retrolisthesis of C5 on C6. No other appreciable spondylolisthesis. Skull base and vertebrae: The skull base and craniocervical junction regions appear normal. There is no evident acute fracture. There are no blastic or lytic bone lesions. Soft tissues and spinal canal: Prevertebral soft tissues and predental space regions are normal. No paraspinous lesion. No cord or canal hematoma evident. Disc levels: There is moderately severe disc space narrowing at C3-4 and C5-6. There is moderate disc space narrowing at C4-5, C6-7, and C7-T1. There is multifocal facet osteoarthritic change. There is exit foraminal narrowing at multiple levels due to bony  overgrowth. No frank disc extrusion or stenosis is evident. Upper chest: Visualized upper lung zone regions are clear. Other: There is bilateral carotid artery calcification. IMPRESSION: CT head: Atrophy with supratentorial small vessel disease. No acute infarct. No mass or hemorrhage. There are foci of arterial vascular calcification. There are foci of paranasal sinus disease. CT cervical spine: No fracture. Areas of mild spondylolisthesis are felt to be due to underlying spondylosis. There is multilevel osteoarthritic change with exit foraminal narrowing at multiple levels. There is carotid artery calcification bilaterally. Electronically Signed   By: Lowella Grip III M.D.   On: 07/27/2017 09:26   Ct Cervical Spine Wo Contrast  Result Date: 07/27/2017 CLINICAL DATA:  Pain following fall EXAM: CT HEAD WITHOUT CONTRAST CT CERVICAL SPINE WITHOUT CONTRAST TECHNIQUE: Multidetector CT imaging of the head and cervical spine was performed following the standard protocol without intravenous contrast. Multiplanar CT image reconstructions of the cervical spine were also generated. COMPARISON:  Head CT February 02, 2017 FINDINGS: CT HEAD FINDINGS Brain: There is moderate diffuse atrophy, stable. There is no appreciable intracranial mass, hemorrhage, extra-axial fluid collection,  or midline shift. There is extensive small vessel disease in the centra semiovale bilaterally. Small vessel disease is noted in each internal and external capsule. No acute infarct is demonstrable. Vascular: There is no hyperdense vessel appreciable. There is calcification in each carotid siphon region and in each distal vertebral artery. Skull: Bony calvarium appears intact. Sinuses/Orbits: There is opacification multiple ethmoid air cells. There is mucosal thickening in each visualized maxillary antrum. There is opacification in the posterior right sphenoid sinus as well. Orbits appear symmetric bilaterally. Other: Mastoid air cells are clear. CT CERVICAL SPINE FINDINGS Alignment: There is 3 mm of anterolisthesis of C6 on C7. There is 1 mm of retrolisthesis of C5 on C6. No other appreciable spondylolisthesis. Skull base and vertebrae: The skull base and craniocervical junction regions appear normal. There is no evident acute fracture. There are no blastic or lytic bone lesions. Soft tissues and spinal canal: Prevertebral soft tissues and predental space regions are normal. No paraspinous lesion. No cord or canal hematoma evident. Disc levels: There is moderately severe disc space narrowing at C3-4 and C5-6. There is moderate disc space narrowing at C4-5, C6-7, and C7-T1. There is multifocal facet osteoarthritic change. There is exit foraminal narrowing at multiple levels due to bony overgrowth. No frank disc extrusion or stenosis is evident. Upper chest: Visualized upper lung zone regions are clear. Other: There is bilateral carotid artery calcification. IMPRESSION: CT head: Atrophy with supratentorial small vessel disease. No acute infarct. No mass or hemorrhage. There are foci of arterial vascular calcification. There are foci of paranasal sinus disease. CT cervical spine: No fracture. Areas of mild spondylolisthesis are felt to be due to underlying spondylosis. There is multilevel osteoarthritic change with  exit foraminal narrowing at multiple levels. There is carotid artery calcification bilaterally. Electronically Signed   By: Lowella Grip III M.D.   On: 07/27/2017 09:26   Mr Jodene Nam Head Wo Contrast  Result Date: 07/27/2017 CLINICAL DATA:  Altered mental status.  Fall. EXAM: MRI HEAD WITHOUT CONTRAST MRA HEAD WITHOUT CONTRAST TECHNIQUE: Multiplanar, multiecho pulse sequences of the brain and surrounding structures were obtained without intravenous contrast. Angiographic images of the head were obtained using MRA technique without contrast. COMPARISON:  Head CT 07/27/2017 FINDINGS: MRI HEAD FINDINGS The examination had to be discontinued prior to completion due to patient's mental status and inability to tolerate the examination despite receiving medication. Axial  and coronal diffusion, sagittal T1, and axial T2 sequences were obtained and are moderately motion degraded. Brain: Small foci of acute cortical/subcortical infarction are noted in the right frontal and right occipital lobes, and there is also a small infarct in the posterior right centrum semiovale. No gross intracranial hemorrhage, mass/mass effect, or extra-axial fluid collection is identified. There is advanced cerebral atrophy. Chronic lacunar infarcts are noted in the deep cerebral white matter bilaterally as well as likely in the right pons. Confluent T2 hyperintensity throughout the cerebral white matter is nonspecific but compatible with extensive chronic small vessel ischemic disease. Vascular: Major intracranial vascular flow voids are preserved. Skull and upper cervical spine: Grossly unremarkable bone marrow signal. Moderate pannus about the dens resulting in mild upper cervical spinal stenosis but without evidence of cord compression. Sinuses/Orbits: Bilateral cataract extraction. Trace right sphenoid sinus fluid. Clear mastoid air cells. Other: None. MRA HEAD FINDINGS The study is moderately to severely motion degraded. The vertebral  arteries are patent to the basilar with the left being dominant. The basilar artery is patent without gross high-grade stenosis. The proximal right P1 segment is patent, however there is loss of flow related enhancement in the distal P1 segment which is somewhat asymmetric compared to the contralateral side and which could reflect artifactual signal loss versus occlusion or high-grade stenosis. The left P1 segment is grossly patent with presumably artifactual signal loss of the P2 and more distal PCA branches. The internal carotid arteries are patent from skull base to carotid termini with limited assessment for stenosis due to artifact. The proximal to mid M1 segments are patent bilaterally, however severe artifact limits assessment of the MCA bifurcations and branch vessels. The right A1 segment in both A2 segments are grossly patent. The left A1 segment is not well seen, potentially small and obscured by motion. IMPRESSION: 1. Motion degraded, incomplete examination. 2. Scattered small acute cortical and white matter infarcts in the right cerebral hemisphere. 3. Advanced chronic small vessel ischemic disease and cerebral atrophy. 4. Severely motion degraded head MRA as above. Electronically Signed   By: Logan Bores M.D.   On: 07/27/2017 12:22   Mr Brain Wo Contrast  Result Date: 07/27/2017 CLINICAL DATA:  Altered mental status.  Fall. EXAM: MRI HEAD WITHOUT CONTRAST MRA HEAD WITHOUT CONTRAST TECHNIQUE: Multiplanar, multiecho pulse sequences of the brain and surrounding structures were obtained without intravenous contrast. Angiographic images of the head were obtained using MRA technique without contrast. COMPARISON:  Head CT 07/27/2017 FINDINGS: MRI HEAD FINDINGS The examination had to be discontinued prior to completion due to patient's mental status and inability to tolerate the examination despite receiving medication. Axial and coronal diffusion, sagittal T1, and axial T2 sequences were obtained and are  moderately motion degraded. Brain: Small foci of acute cortical/subcortical infarction are noted in the right frontal and right occipital lobes, and there is also a small infarct in the posterior right centrum semiovale. No gross intracranial hemorrhage, mass/mass effect, or extra-axial fluid collection is identified. There is advanced cerebral atrophy. Chronic lacunar infarcts are noted in the deep cerebral white matter bilaterally as well as likely in the right pons. Confluent T2 hyperintensity throughout the cerebral white matter is nonspecific but compatible with extensive chronic small vessel ischemic disease. Vascular: Major intracranial vascular flow voids are preserved. Skull and upper cervical spine: Grossly unremarkable bone marrow signal. Moderate pannus about the dens resulting in mild upper cervical spinal stenosis but without evidence of cord compression. Sinuses/Orbits: Bilateral cataract extraction. Trace right sphenoid  sinus fluid. Clear mastoid air cells. Other: None. MRA HEAD FINDINGS The study is moderately to severely motion degraded. The vertebral arteries are patent to the basilar with the left being dominant. The basilar artery is patent without gross high-grade stenosis. The proximal right P1 segment is patent, however there is loss of flow related enhancement in the distal P1 segment which is somewhat asymmetric compared to the contralateral side and which could reflect artifactual signal loss versus occlusion or high-grade stenosis. The left P1 segment is grossly patent with presumably artifactual signal loss of the P2 and more distal PCA branches. The internal carotid arteries are patent from skull base to carotid termini with limited assessment for stenosis due to artifact. The proximal to mid M1 segments are patent bilaterally, however severe artifact limits assessment of the MCA bifurcations and branch vessels. The right A1 segment in both A2 segments are grossly patent. The left A1  segment is not well seen, potentially small and obscured by motion. IMPRESSION: 1. Motion degraded, incomplete examination. 2. Scattered small acute cortical and white matter infarcts in the right cerebral hemisphere. 3. Advanced chronic small vessel ischemic disease and cerebral atrophy. 4. Severely motion degraded head MRA as above. Electronically Signed   By: Logan Bores M.D.   On: 07/27/2017 12:22    EKG:  Mild QT prolongation, sinus rhythm, no acute ischemic changes.   Assessment/Plan 1-Acute encephalopathy: etiology unclear -MRI motion degraded but suggesting acute/subacute ischemic infarct -will pursuit stroke work up -given fevers and complaints of HA's/eyes pain; will check LP and empirically cover for meningitis  -UA w/o signs of infection, but will check urine culture -also checking HIV, TSH, B12, RPR, blood cx's and would repeat CXR after proper hydration -will use empiric coverage with vancomycin, rocephin and valtrex -follow clinical response  2-SIRS -patient with fever, tachycardia and mild elevation on RR on admission. -no source identified -will pan-cultured him and cover empirically with vancomycin, rocephin and valtrex -continue supportive care and follow clinical response   3-HLD (hyperlipidemia) -checking lipid panel as per stroke work up -once able to take PO's will resume statins  4-Essential hypertension -BP soft on admission  -continue IVF's -follow VS  5-prior hx of CEREBROVASCULAR DISEASE -patient was on ASA for secondary prevention -will complete stroke work up -might need plavix instead of ASA for secondary prevention. -neurology consulted.   6-Type 2 diabetes mellitus with complications (HCC) -continue lantus and SSI  7-CKD (chronic kidney disease), stage III (HCC) -stable overall -will monitor renal function   8-Glaucoma -continue home eye drops regimen  9-BPH -no signs of acute urinary retention symptoms  -will continue flomax as soon  as patient able to take PO's    Time: 70 minutes   DVT prophylaxis: heparin   Code Status: full Family Communication: daughter at bedside   Disposition Plan: to be determine Consults called: neurology  Admission status: inpatient, stepdown, LOS > 2 midnights    Barton Dubois MD [facility] Pager [phone]  If 7PM-7AM, please contact night-coverage [email or web address] Password [email or web address]  07/27/2017, 2:26 PM

## 2017-07-27 NOTE — ED Triage Notes (Signed)
Pt reports he got up twice this morning and fell x 2.  EMS reports no loss of consciousness.  Reports hematoma to back of head and c/o r eye pain since the fall.  Pt says he did not hit his eye when he fell but it started hurting after he hit his head.  Pt unsure what made him fall.

## 2017-07-27 NOTE — ED Notes (Signed)
In with EDP to perform lumbar puncture.

## 2017-07-27 NOTE — ED Notes (Signed)
Pt now back in bed with clean linens, seizure pads and safety sitter. Lights out

## 2017-07-27 NOTE — ED Notes (Signed)
hopitalist aware pt unable to take po meds due to lethargy. Also advised of bp in 80s. vo given

## 2017-07-27 NOTE — ED Notes (Addendum)
edp in. Pt keeps pulling all cords/leads off

## 2017-07-28 ENCOUNTER — Inpatient Hospital Stay (HOSPITAL_COMMUNITY): Payer: PPO

## 2017-07-28 ENCOUNTER — Other Ambulatory Visit: Payer: Self-pay

## 2017-07-28 DIAGNOSIS — E118 Type 2 diabetes mellitus with unspecified complications: Secondary | ICD-10-CM

## 2017-07-28 DIAGNOSIS — R509 Fever, unspecified: Secondary | ICD-10-CM

## 2017-07-28 DIAGNOSIS — I340 Nonrheumatic mitral (valve) insufficiency: Secondary | ICD-10-CM

## 2017-07-28 DIAGNOSIS — I639 Cerebral infarction, unspecified: Secondary | ICD-10-CM

## 2017-07-28 LAB — BASIC METABOLIC PANEL
Anion gap: 8 (ref 5–15)
BUN: 17 mg/dL (ref 6–20)
CALCIUM: 8 mg/dL — AB (ref 8.9–10.3)
CO2: 24 mmol/L (ref 22–32)
Chloride: 106 mmol/L (ref 101–111)
Creatinine, Ser: 1.41 mg/dL — ABNORMAL HIGH (ref 0.61–1.24)
GFR calc Af Amer: 50 mL/min — ABNORMAL LOW (ref >60)
GFR calc non Af Amer: 43 mL/min — ABNORMAL LOW (ref >60)
GLUCOSE: 91 mg/dL (ref 65–99)
Potassium: 3.3 mmol/L — ABNORMAL LOW (ref 3.5–5.1)
Sodium: 138 mmol/L (ref 135–145)

## 2017-07-28 LAB — LIPID PANEL
CHOL/HDL RATIO: 6.6 ratio
Cholesterol: 164 mg/dL (ref 0–200)
HDL: 25 mg/dL — ABNORMAL LOW (ref >40)
LDL Cholesterol: 108 mg/dL — ABNORMAL HIGH (ref 0–99)
Triglycerides: 156 mg/dL — ABNORMAL HIGH (ref <150)
VLDL: 31 mg/dL (ref 0–40)

## 2017-07-28 LAB — CBC
HCT: 42.9 % (ref 39.0–52.0)
Hemoglobin: 13.6 g/dL (ref 13.0–17.0)
MCH: 28.5 pg (ref 26.0–34.0)
MCHC: 31.7 g/dL (ref 30.0–36.0)
MCV: 89.9 fL (ref 78.0–100.0)
PLATELETS: 130 10*3/uL — AB (ref 150–400)
RBC: 4.77 MIL/uL (ref 4.22–5.81)
RDW: 16.4 % — AB (ref 11.5–15.5)
WBC: 6.7 10*3/uL (ref 4.0–10.5)

## 2017-07-28 LAB — GLUCOSE, CAPILLARY
GLUCOSE-CAPILLARY: 95 mg/dL (ref 65–99)
Glucose-Capillary: 87 mg/dL (ref 65–99)
Glucose-Capillary: 99 mg/dL (ref 65–99)
Glucose-Capillary: 117 mg/dL — ABNORMAL HIGH (ref 65–99)

## 2017-07-28 LAB — MRSA PCR SCREENING: MRSA by PCR: NEGATIVE

## 2017-07-28 LAB — HIV ANTIBODY (ROUTINE TESTING W REFLEX): HIV Screen 4th Generation wRfx: NONREACTIVE

## 2017-07-28 LAB — ECHOCARDIOGRAM COMPLETE
Height: 66 in
WEIGHTICAEL: 3040.58 [oz_av]

## 2017-07-28 LAB — RPR: RPR Ser Ql: NONREACTIVE

## 2017-07-28 MED ORDER — CYANOCOBALAMIN 1000 MCG/ML IJ SOLN
1000.0000 ug | Freq: Once | INTRAMUSCULAR | Status: AC
  Administered 2017-07-28: 1000 ug via INTRAMUSCULAR
  Filled 2017-07-28: qty 1

## 2017-07-28 MED ORDER — OMEGA-3-ACID ETHYL ESTERS 1 G PO CAPS
1.0000 g | ORAL_CAPSULE | Freq: Two times a day (BID) | ORAL | Status: DC
  Administered 2017-07-28 – 2017-08-01 (×8): 1 g via ORAL
  Filled 2017-07-28 (×10): qty 1

## 2017-07-28 MED ORDER — POTASSIUM CHLORIDE CRYS ER 20 MEQ PO TBCR
40.0000 meq | EXTENDED_RELEASE_TABLET | Freq: Once | ORAL | Status: AC
  Administered 2017-07-28: 40 meq via ORAL
  Filled 2017-07-28: qty 2

## 2017-07-28 NOTE — Progress Notes (Signed)
Patient and granddaughter informed by Speech therapy and nurse of Carb modified diet being placed and that dinner trays will be coming between 1630 and 1700. Granddaughter brought patient food from [facility] and states that his blood sugar is normally between 150 and 200. Education provided to both patient and granddaughter

## 2017-07-28 NOTE — Progress Notes (Signed)
[facility] PROGRESS NOTE  FRAZIER BALFOUR QQV:[id number] DOB: Jun 29, 1927 DOA: 07/27/2017 PCP: Phillips Odor, MD  Assessment/Plan: 1-Acute encephalopathy: etiology unclear, but most likely associated with acute stroke . -MRI motion degraded but suggesting acute/subacute ischemic infarct -complete stroke work up and follow neurology rec's.  -HIV and RPR non reactive, B12 and TSH WNL -will discontinue vancomycin and valtrex -patient w/o neck rigidity and currently with normal mentation -no fever or signs of acute infection  -follow clinical response   2-SIRS -patient with fever, tachycardia and mild elevation on RR on admission. -no source identified -most likely autonomic response from acute stroke  -follow final cx's results -stop vancomycin and valtrex -continue rocephin for now (that would cover any component of sinusitis   3-HLD (hyperlipidemia) -LDL 108 -with intolerance to statins -will use lovaza  4-Essential hypertension -BP stable now -will slowly resume home antihypertensive regimen   5-prior hx of CEREBROVASCULAR DISEASE: now with new ischemic event affecting cortical and white matter. -continue full dose ASA for now -neurology consulted -complete stroke work up   6-Type 2 diabetes mellitus with complications ([place]) -continue lantus and SSI -carb modified diet to be ordered after evaluation by speech   7-CKD (chronic kidney disease), stage III (HCC) -Cr 1.4 currently -follow trend   8-Glaucoma -denying eye pain currently -per family, patient to have eye surgery next week  9-BPH -no signs of acute urinary retention symptoms  -resume flomax  10-sinusitis -continue flonase and loratadine -receiving rocephin currently -once taking PO's will use augmentin and treat for 5 days.  Code Status: Full Family Communication: granddaughter at bedside  Disposition Plan: transfer out of stepdown; follow neurology recommendations; complete stroke work up.  Hopefully back home in am.    Consultants:  Neurology   Procedures:  See below for x-ray reports   Echo: pending   Carotid duplex: pending   Antibiotics:  Vancomycin and valtrex 07/27/17>>>07/28/17  Rocephin 07/27/17>>>  HPI/Subjective: Afebrile, no chest pain, no shortness of breath; currently oriented X 3 and in no acute distress.  Objective: Vitals:   07/28/17 0600 07/28/17 0809  BP: 136/68 137/72  Pulse: 64 66  Resp: 19   Temp:    SpO2: 97%     Intake/Output Summary (Last 24 hours) at 07/28/2017 1055 Last data filed at 07/28/2017 0800 Gross per 24 hour  Intake 2065.53 ml  Output -  Net 2065.53 ml   Filed Weights   07/27/17 1305 07/27/17 2131  Weight: 86.2 kg (190 lb) 86.2 kg (190 lb 0.6 oz)    Exam:   General: Afebrile, alert, awake and oriented x3, no chest pain, no shortness of breath.  Able to follow commands and answering questions appropriately.  Cardiovascular: S1 and S2, no rubs, no gallops, no JVD  Respiratory: Good air movement bilaterally, no wheezing, no crackles  Abdomen: Soft, nontender, nondistended, positive bowel sounds  Musculoskeletal: No edema, no cyanosis, no clubbing  Neurology: Patient without focal motor deficit.  Able to follow commands appropriately and oriented x3 examination.  Data Reviewed: Basic Metabolic Panel: Recent Labs  Lab 07/27/17 1028 07/28/17 0401  NA 139 138  K 3.7 3.3*  CL 103 106  CO2 25 24  GLUCOSE 151* 91  BUN 19 17  CREATININE 1.38* 1.41*  CALCIUM 9.0 8.0*   CBC: Recent Labs  Lab 07/27/17 1028 07/28/17 0401  WBC 9.5 6.7  NEUTROABS 7.2  --   HGB 15.9 13.6  HCT 49.5 42.9  MCV 88.6 89.9  PLT 158 130*   CBG:  Recent Labs  Lab 07/27/17 1623 07/27/17 2348 07/28/17 0758  GLUCAP 113* 87 87    Recent Results (from the past 240 hour(s))  Culture, blood (Routine X 2) w Reflex to ID Panel     Status: None (Preliminary result)   Collection Time: 07/27/17 12:42 PM  Result Value Ref Range Status    Specimen Description BLOOD LEFT FOREARM  Final   Special Requests   Final    BOTTLES DRAWN AEROBIC AND ANAEROBIC Blood Culture adequate volume   Culture   Final    NO GROWTH < 24 HOURS Performed at [facility], [address], [place], [place] [address]    Report Status PENDING  Incomplete  Culture, blood (Routine X 2) w Reflex to ID Panel     Status: None (Preliminary result)   Collection Time: 07/27/17 12:43 PM  Result Value Ref Range Status   Specimen Description BLOOD LEFT WRIST  Final   Special Requests   Final    BOTTLES DRAWN AEROBIC ONLY Blood Culture adequate volume   Culture   Final    NO GROWTH < 24 HOURS Performed at [facility], [address], [place], [place] [address]    Report Status PENDING  Incomplete  CSF culture     Status: None (Preliminary result)   Collection Time: 07/27/17  3:12 PM  Result Value Ref Range Status   Specimen Description CSF  Final   Special Requests NONE  Final   Gram Stain   Final    NO ORGANISMS SEEN CYTOSPIN SMEAR Performed at [facility] NO WBC SEEN Performed at [facility], [address] [place], [place] [address]    Culture PENDING  Incomplete   Report Status PENDING  Incomplete  MRSA PCR Screening     Status: None   Collection Time: 07/27/17 11:36 PM  Result Value Ref Range Status   MRSA by PCR NEGATIVE NEGATIVE Final    Comment:        The GeneXpert MRSA Assay (FDA approved for NASAL specimens only), is one component of a comprehensive MRSA colonization surveillance program. It is not intended to diagnose MRSA infection nor to guide or monitor treatment for MRSA infections. Performed at [facility], [address], [place],  [address]      Studies: Dg Chest 2 View  Result Date: 07/27/2017 CLINICAL DATA:  Altered mental status EXAM: CHEST  2 VIEW COMPARISON:  02/02/2017 FINDINGS: Cardiac shadow is mildly enlarged but stable. Aortic calcifications are again seen and stable. The lungs are well  aerated bilaterally. No focal infiltrate or sizable effusion is seen. No acute bony abnormality is noted. IMPRESSION: No acute abnormality noted. Electronically Signed   By: Inez Catalina M.D.   On: 07/27/2017 10:01   Ct Head Wo Contrast  Result Date: 07/27/2017 CLINICAL DATA:  Pain following fall EXAM: CT HEAD WITHOUT CONTRAST CT CERVICAL SPINE WITHOUT CONTRAST TECHNIQUE: Multidetector CT imaging of the head and cervical spine was performed following the standard protocol without intravenous contrast. Multiplanar CT image reconstructions of the cervical spine were also generated. COMPARISON:  Head CT February 02, 2017 FINDINGS: CT HEAD FINDINGS Brain: There is moderate diffuse atrophy, stable. There is no appreciable intracranial mass, hemorrhage, extra-axial fluid collection, or midline shift. There is extensive small vessel disease in the centra semiovale bilaterally. Small vessel disease is noted in each internal and external capsule. No acute infarct is demonstrable. Vascular: There is no hyperdense vessel appreciable. There is calcification in each carotid siphon region  and in each distal vertebral artery. Skull: Bony calvarium appears intact. Sinuses/Orbits: There is opacification multiple ethmoid air cells. There is mucosal thickening in each visualized maxillary antrum. There is opacification in the posterior right sphenoid sinus as well. Orbits appear symmetric bilaterally. Other: Mastoid air cells are clear. CT CERVICAL SPINE FINDINGS Alignment: There is 3 mm of anterolisthesis of C6 on C7. There is 1 mm of retrolisthesis of C5 on C6. No other appreciable spondylolisthesis. Skull base and vertebrae: The skull base and craniocervical junction regions appear normal. There is no evident acute fracture. There are no blastic or lytic bone lesions. Soft tissues and spinal canal: Prevertebral soft tissues and predental space regions are normal. No paraspinous lesion. No cord or canal hematoma evident. Disc  levels: There is moderately severe disc space narrowing at C3-4 and C5-6. There is moderate disc space narrowing at C4-5, C6-7, and C7-T1. There is multifocal facet osteoarthritic change. There is exit foraminal narrowing at multiple levels due to bony overgrowth. No frank disc extrusion or stenosis is evident. Upper chest: Visualized upper lung zone regions are clear. Other: There is bilateral carotid artery calcification. IMPRESSION: CT head: Atrophy with supratentorial small vessel disease. No acute infarct. No mass or hemorrhage. There are foci of arterial vascular calcification. There are foci of paranasal sinus disease. CT cervical spine: No fracture. Areas of mild spondylolisthesis are felt to be due to underlying spondylosis. There is multilevel osteoarthritic change with exit foraminal narrowing at multiple levels. There is carotid artery calcification bilaterally. Electronically Signed   By: Lowella Grip III M.D.   On: 07/27/2017 09:26   Ct Cervical Spine Wo Contrast  Result Date: 07/27/2017 CLINICAL DATA:  Pain following fall EXAM: CT HEAD WITHOUT CONTRAST CT CERVICAL SPINE WITHOUT CONTRAST TECHNIQUE: Multidetector CT imaging of the head and cervical spine was performed following the standard protocol without intravenous contrast. Multiplanar CT image reconstructions of the cervical spine were also generated. COMPARISON:  Head CT February 02, 2017 FINDINGS: CT HEAD FINDINGS Brain: There is moderate diffuse atrophy, stable. There is no appreciable intracranial mass, hemorrhage, extra-axial fluid collection, or midline shift. There is extensive small vessel disease in the centra semiovale bilaterally. Small vessel disease is noted in each internal and external capsule. No acute infarct is demonstrable. Vascular: There is no hyperdense vessel appreciable. There is calcification in each carotid siphon region and in each distal vertebral artery. Skull: Bony calvarium appears intact. Sinuses/Orbits: There  is opacification multiple ethmoid air cells. There is mucosal thickening in each visualized maxillary antrum. There is opacification in the posterior right sphenoid sinus as well. Orbits appear symmetric bilaterally. Other: Mastoid air cells are clear. CT CERVICAL SPINE FINDINGS Alignment: There is 3 mm of anterolisthesis of C6 on C7. There is 1 mm of retrolisthesis of C5 on C6. No other appreciable spondylolisthesis. Skull base and vertebrae: The skull base and craniocervical junction regions appear normal. There is no evident acute fracture. There are no blastic or lytic bone lesions. Soft tissues and spinal canal: Prevertebral soft tissues and predental space regions are normal. No paraspinous lesion. No cord or canal hematoma evident. Disc levels: There is moderately severe disc space narrowing at C3-4 and C5-6. There is moderate disc space narrowing at C4-5, C6-7, and C7-T1. There is multifocal facet osteoarthritic change. There is exit foraminal narrowing at multiple levels due to bony overgrowth. No frank disc extrusion or stenosis is evident. Upper chest: Visualized upper lung zone regions are clear. Other: There is bilateral carotid artery calcification.  IMPRESSION: CT head: Atrophy with supratentorial small vessel disease. No acute infarct. No mass or hemorrhage. There are foci of arterial vascular calcification. There are foci of paranasal sinus disease. CT cervical spine: No fracture. Areas of mild spondylolisthesis are felt to be due to underlying spondylosis. There is multilevel osteoarthritic change with exit foraminal narrowing at multiple levels. There is carotid artery calcification bilaterally. Electronically Signed   By: Lowella Grip III M.D.   On: 07/27/2017 09:26   Mr Jodene Nam Head Wo Contrast  Result Date: 07/27/2017 CLINICAL DATA:  Altered mental status.  Fall. EXAM: MRI HEAD WITHOUT CONTRAST MRA HEAD WITHOUT CONTRAST TECHNIQUE: Multiplanar, multiecho pulse sequences of the brain and  surrounding structures were obtained without intravenous contrast. Angiographic images of the head were obtained using MRA technique without contrast. COMPARISON:  Head CT 07/27/2017 FINDINGS: MRI HEAD FINDINGS The examination had to be discontinued prior to completion due to patient's mental status and inability to tolerate the examination despite receiving medication. Axial and coronal diffusion, sagittal T1, and axial T2 sequences were obtained and are moderately motion degraded. Brain: Small foci of acute cortical/subcortical infarction are noted in the right frontal and right occipital lobes, and there is also a small infarct in the posterior right centrum semiovale. No gross intracranial hemorrhage, mass/mass effect, or extra-axial fluid collection is identified. There is advanced cerebral atrophy. Chronic lacunar infarcts are noted in the deep cerebral white matter bilaterally as well as likely in the right pons. Confluent T2 hyperintensity throughout the cerebral white matter is nonspecific but compatible with extensive chronic small vessel ischemic disease. Vascular: Major intracranial vascular flow voids are preserved. Skull and upper cervical spine: Grossly unremarkable bone marrow signal. Moderate pannus about the dens resulting in mild upper cervical spinal stenosis but without evidence of cord compression. Sinuses/Orbits: Bilateral cataract extraction. Trace right sphenoid sinus fluid. Clear mastoid air cells. Other: None. MRA HEAD FINDINGS The study is moderately to severely motion degraded. The vertebral arteries are patent to the basilar with the left being dominant. The basilar artery is patent without gross high-grade stenosis. The proximal right P1 segment is patent, however there is loss of flow related enhancement in the distal P1 segment which is somewhat asymmetric compared to the contralateral side and which could reflect artifactual signal loss versus occlusion or high-grade stenosis. The  left P1 segment is grossly patent with presumably artifactual signal loss of the P2 and more distal PCA branches. The internal carotid arteries are patent from skull base to carotid termini with limited assessment for stenosis due to artifact. The proximal to mid M1 segments are patent bilaterally, however severe artifact limits assessment of the MCA bifurcations and branch vessels. The right A1 segment in both A2 segments are grossly patent. The left A1 segment is not well seen, potentially small and obscured by motion. IMPRESSION: 1. Motion degraded, incomplete examination. 2. Scattered small acute cortical and white matter infarcts in the right cerebral hemisphere. 3. Advanced chronic small vessel ischemic disease and cerebral atrophy. 4. Severely motion degraded head MRA as above. Electronically Signed   By: Logan Bores M.D.   On: 07/27/2017 12:22   Mr Brain Wo Contrast  Result Date: 07/27/2017 CLINICAL DATA:  Altered mental status.  Fall. EXAM: MRI HEAD WITHOUT CONTRAST MRA HEAD WITHOUT CONTRAST TECHNIQUE: Multiplanar, multiecho pulse sequences of the brain and surrounding structures were obtained without intravenous contrast. Angiographic images of the head were obtained using MRA technique without contrast. COMPARISON:  Head CT 07/27/2017 FINDINGS: MRI HEAD FINDINGS  The examination had to be discontinued prior to completion due to patient's mental status and inability to tolerate the examination despite receiving medication. Axial and coronal diffusion, sagittal T1, and axial T2 sequences were obtained and are moderately motion degraded. Brain: Small foci of acute cortical/subcortical infarction are noted in the right frontal and right occipital lobes, and there is also a small infarct in the posterior right centrum semiovale. No gross intracranial hemorrhage, mass/mass effect, or extra-axial fluid collection is identified. There is advanced cerebral atrophy. Chronic lacunar infarcts are noted in the deep  cerebral white matter bilaterally as well as likely in the right pons. Confluent T2 hyperintensity throughout the cerebral white matter is nonspecific but compatible with extensive chronic small vessel ischemic disease. Vascular: Major intracranial vascular flow voids are preserved. Skull and upper cervical spine: Grossly unremarkable bone marrow signal. Moderate pannus about the dens resulting in mild upper cervical spinal stenosis but without evidence of cord compression. Sinuses/Orbits: Bilateral cataract extraction. Trace right sphenoid sinus fluid. Clear mastoid air cells. Other: None. MRA HEAD FINDINGS The study is moderately to severely motion degraded. The vertebral arteries are patent to the basilar with the left being dominant. The basilar artery is patent without gross high-grade stenosis. The proximal right P1 segment is patent, however there is loss of flow related enhancement in the distal P1 segment which is somewhat asymmetric compared to the contralateral side and which could reflect artifactual signal loss versus occlusion or high-grade stenosis. The left P1 segment is grossly patent with presumably artifactual signal loss of the P2 and more distal PCA branches. The internal carotid arteries are patent from skull base to carotid termini with limited assessment for stenosis due to artifact. The proximal to mid M1 segments are patent bilaterally, however severe artifact limits assessment of the MCA bifurcations and branch vessels. The right A1 segment in both A2 segments are grossly patent. The left A1 segment is not well seen, potentially small and obscured by motion. IMPRESSION: 1. Motion degraded, incomplete examination. 2. Scattered small acute cortical and white matter infarcts in the right cerebral hemisphere. 3. Advanced chronic small vessel ischemic disease and cerebral atrophy. 4. Severely motion degraded head MRA as above. Electronically Signed   By: Logan Bores M.D.   On: 07/27/2017 12:22    Dg Lumbar Puncture Fluoro Guide  Result Date: 07/27/2017 CLINICAL DATA:  Altered mental status, fever. EXAM: DIAGNOSTIC LUMBAR PUNCTURE UNDER FLUOROSCOPIC GUIDANCE FLUOROSCOPY TIME:  Radiation Exposure Index (if provided by the fluoroscopic device): 86.6 mGy. PROCEDURE: Informed consent was obtained from the patient's power of attorney prior to the procedure, including potential complications of headache, allergy, and pain. With the patient prone, the lower back was prepped with Betadine. 1% Lidocaine was used for local anesthesia. Under fluoroscopic guidance, lumbar puncture was performed at the L3-4 level using a 22 gauge needle with return of clear CSF; left paramedian approach was utilized. Opening pressure was not obtained as the patient could not be positioned appropriately. 10 ml of CSF were obtained for laboratory studies. The patient tolerated the procedure well and there were no apparent complications. IMPRESSION: Under fluoroscopic guidance, successful lumbar puncture was performed for diagnostic purposes. Electronically Signed   By: Marijo Conception, M.D.   On: 07/27/2017 15:27    Scheduled Meds: .  stroke: mapping our early stages of recovery book   Does not apply Once  . aspirin  300 mg Rectal Daily   Or  . aspirin  325 mg Oral Daily  .  betaxolol  1 drop Both Eyes [id number]  . carvedilol  12.5 mg Oral BID WC  . dorzolamide  1 drop Left Eye BID  . fluticasone  1 spray Each Nare Daily  . fluticasone furoate-vilanterol  1 puff Inhalation Daily  . heparin  5,000 Units Subcutaneous Q8H  . insulin aspart  0-9 Units Subcutaneous TID WC  . insulin detemir  5 Units Subcutaneous QHS  . latanoprost  1 drop Both Eyes Daily  . loratadine  10 mg Oral Daily  . Netarsudil Dimesylate  1 drop Ophthalmic QHS  . pantoprazole  40 mg Oral Daily  . pilocarpine  1 drop Left Eye QID  . tamsulosin  0.4 mg Oral Daily  . umeclidinium bromide  1 puff Inhalation Daily   Continuous Infusions: . sodium  chloride 100 mL/hr (07/27/17 2200)  . cefTRIAXone (ROCEPHIN)  IV Stopped (07/28/17 0000)   Time spent: 30 minutes   [facility] Pager [phone]. If 7PM-7AM, please contact night-coverage at [email or web address], password [facility] 07/28/2017, 10:55 AM  LOS: 1 day

## 2017-07-28 NOTE — Progress Notes (Signed)
*  PRELIMINARY RESULTS* Echocardiogram 2D Echocardiogram has been performed.  Anthony Murray 07/28/2017, 10:31 AM

## 2017-07-28 NOTE — Progress Notes (Signed)
Report given to Joyce, RN

## 2017-07-28 NOTE — Evaluation (Signed)
Physical Therapy Evaluation Patient Details Name: Anthony Murray MRN: [id number] DOB: [birth year] Today's Date: 07/28/2017   History of Present Illness  Anthony Murray is a 84 year old male with a past medical history significant for chronic kidney disease stage III, type 2 diabetes, hypertension, BPH, GERD, glaucoma and COPD; who presented to the emergency department secondary to fever, mechanical fall, changes in mentation and some headache.  Per family patient was doing great until the night prior to admission and then suddenly very early in the morning after he woke up he experienced to episodes back to back of falling with the last one hitting his head; he has been complaining of feeling and some upper respiratory infection symptoms with some ongoing congestion and a recent use of Mucinex DM to try to break up loose some phlegm. No CP, no hemoptysis, no complaints of dysuria, nausea, vomiting or any other complaints.     Clinical Impression  Patient limited for functional mobility as stated below secondary to BLE weakness, fatigue and fair/poor standing balance.  Patient will benefit from continued physical therapy in hospital and recommended venue below to increase strength, balance, endurance for safe ADLs and gait.     Follow Up Recommendations Home health PT;Supervision for mobility/OOB    Equipment Recommendations  Rolling walker with 5" wheels    Recommendations for Other Services       Precautions / Restrictions Precautions Precautions: Fall Restrictions Weight Bearing Restrictions: Yes LLE Weight Bearing: Weight bearing as tolerated      Mobility  Bed Mobility Overal bed mobility: Needs Assistance Bed Mobility: Supine to Sit     Supine to sit: Min guard        Transfers Overall transfer level: Needs assistance Equipment used: Rolling walker (2 wheeled);None Transfers: Sit to/from [employer] to Stand: Min assist Stand pivot transfers: Min  assist       General transfer comment: unsteady without assistive device  Ambulation/Gait Ambulation/Gait assistance: Min assist Ambulation Distance (Feet): 15 Feet Assistive device: Rolling walker (2 wheeled) Gait Pattern/deviations: Decreased step length - right;Decreased step length - left;Decreased stride length   Gait velocity interpretation: Below normal speed for age/gender General Gait Details: demonstrates slow labored cadence, fatigues easily when using RW as a pick-up walker, unsteady/unsafe to ambulate without assistive device  Stairs            Wheelchair Mobility    Modified Rankin (Stroke Patients Only)       Balance Overall balance assessment: Needs assistance Sitting-balance support: Feet supported;No upper extremity supported Sitting balance-Leahy Scale: Good     Standing balance support: During functional activity;Bilateral upper extremity supported Standing balance-Leahy Scale: Fair Standing balance comment: fair/poor without an assistive device                              Pertinent Vitals/Pain Pain Assessment: No/denies pain    Home Living Family/patient expects to be discharged to:: Private residence Living Arrangements: Spouse/significant other Available Help at Discharge: Family;Available PRN/intermittently Type of Home: House Home Access: Level entry     Home Layout: One level Home Equipment: Walker - standard;Cane - single point;Wheelchair - [employer];Hospital bed      Prior Function Level of Independence: Independent with assistive device(s);Needs assistance   Gait / Transfers Assistance Needed: can ambulate short household distances without assistive device, uses standard walker PRN, uses wheelchair for longer distances  ADL's / Homemaking Assistance Needed: assisted daily by  family  Comments: can ambulate short household distances without assistive device, uses standard walker PRN, uses wheelchair for  longer distances     Hand Dominance        Extremity/Trunk Assessment   Upper Extremity Assessment Upper Extremity Assessment: Generalized weakness    Lower Extremity Assessment Lower Extremity Assessment: Generalized weakness    Cervical / Trunk Assessment Cervical / Trunk Assessment: Normal  Communication   Communication: No difficulties  Cognition Arousal/Alertness: Awake/alert Behavior During Therapy: WFL for tasks assessed/performed Overall Cognitive Status: Within Functional Limits for tasks assessed                                        General Comments      Exercises     Assessment/Plan    PT Assessment Patient needs continued PT services  PT Problem List Decreased strength;Decreased activity tolerance;Decreased balance;Decreased mobility       PT Treatment Interventions Gait training;Functional mobility training;Therapeutic activities;Therapeutic exercise;Patient/family education    PT Goals (Current goals can be found in the Care Plan section)  Acute Rehab PT Goals Patient Stated Goal: return home with family to assist PT Goal Formulation: With patient/family Time For Goal Achievement: 08/04/17 Potential to Achieve Goals: Good    Frequency Min 3X/week   Barriers to discharge        Co-evaluation               AM-PAC PT "6 Clicks" Daily Activity  Outcome Measure Difficulty turning over in bed (including adjusting bedclothes, sheets and blankets)?: A Little Difficulty moving from lying on back to sitting on the side of the bed? : A Little Difficulty sitting down on and standing up from a chair with arms (e.g., wheelchair, bedside commode, etc,.)?: A Little Help needed moving to and from a bed to chair (including a wheelchair)?: A Little Help needed walking in hospital room?: A Little Help needed climbing 3-5 steps with a railing? : A Lot 6 Click Score: 17    End of Session   Activity Tolerance: Patient tolerated  treatment well;Patient limited by fatigue Patient left: in chair;with call bell/phone within reach;with family/visitor present Nurse Communication: Mobility status PT Visit Diagnosis: Unsteadiness on feet (R26.81);Other abnormalities of gait and mobility (R26.89);Muscle weakness (generalized) (M62.81)    Time: 9937-1696 PT Time Calculation (min) (ACUTE ONLY): 27 min   Charges:   PT Evaluation $PT Eval Moderate Complexity: 1 Mod PT Treatments $Therapeutic Activity: 23-37 mins   PT G Codes:        11:26 AM, Aug 26, 2017 Lonell Grandchild, MPT Physical Therapist with [facility] [phone] office [phone] mobile phone

## 2017-07-28 NOTE — Evaluation (Signed)
Clinical/Bedside Swallow Evaluation Patient Details  Name: Anthony Murray MRN: [id number] Date of Birth: 1928/03/28  Today's Date: 07/28/2017 Time: SLP Start Time (ACUTE ONLY): 2458 SLP Stop Time (ACUTE ONLY): 1545 SLP Time Calculation (min) (ACUTE ONLY): 24 min  Past Medical History:  Past Medical History:  Diagnosis Date  . Aortic valve stenosis   . ASCVD (arteriosclerotic cardiovascular disease)    50% ostial left left anterior desending 60% mid circumflex and normal ejection fractioon in 2006 exertional dyspnea;history of chest discomfort  . Cerebrovascular disease    minimal carotid bruits  . CKD (chronic kidney disease)   . Diabetes mellitus type 2, controlled ([place])    no insulin  . Hyperlipidemia    statin intolerant  . Hypertension   . Tobacco abuse, in remission    30 pack years stopped in 1971   Past Surgical History:  Past Surgical History:  Procedure Laterality Date  . APPENDECTOMY    . CARPAL TUNNEL RELEASE     surgery left 05/05/2001  . KNEE ARTHROSCOPY     bilaterial; unknown associated surgical procedure  . SHOULDER SURGERY     Right x2; third procedure on left  . TONSILLECTOMY    . TRANSURETHRAL INCISION OF PROSTATE     resection of the prostate   HPI:  84 year old male with a past medical history significant for chronic kidney disease stage III, type 2 diabetes, hypertension, BPH, GERD, glaucoma and COPD; who presented to the emergency department secondary to fever, mechanical fall, changes in mentation and some headache.  Per family patient was doing great until the night prior to admission and then suddenly very early in the morning after he woke up he experienced to episodes back to back of falling with the last one hitting his head; he has been complaining of feeling and some upper respiratory infection symptoms with some ongoing congestion and a recent use of Mucinex DM to try to break up loose some phlegm. No CP, no hemoptysis, no complaints of dysuria,  nausea, vomiting or any other complaints.  MRA head on 07/27/17 indicated Motion degraded, incomplete examination. 2. Scattered small acute cortical and white matter infarcts in the right cerebral hemisphere. 3. Advanced chronic small vessel ischemic disease and cerebral atrophy. 4. Severely motion degraded head MRA as above. CXR on 07/27/17 indicated No acute abnormality noted; BSE ordered d/t decreased mentation upon arrival in ED.   Assessment / Plan / Recommendation Clinical Impression   Pt with initial delayed throat clearing with spoon sip of thin liquid, but pt stated "It went back before I was ready for it"; subsequent swallows of thin liquids appeared to transition without difficulty; impaired mastication noted with solids, but pt did not have dentures available; mastication was slow but thorough with solids and granddaughter stated he eats "without dentures" often at home; no other overt s/s of aspiration noted during evaluation; recommend carb modified diet with thin liquids; ST will f/u for diet tolerance and education re: swallowing safety while in acute setting. SLP Visit Diagnosis: Dysphagia, oropharyngeal phase (R13.12)    Aspiration Risk  Mild aspiration risk    Diet Recommendation   Carb modified/thin liquids  Medication Administration: Whole meds with liquid    Other  Recommendations Oral Care Recommendations: Oral care BID   Follow up Recommendations Other (comment)(TBD)      Frequency and Duration min 2x/week  1 week       Prognosis Prognosis for Safe Diet Advancement: Good      Swallow Study  General Date of Onset: 07/27/17 HPI: 84 year old male with a past medical history significant for chronic kidney disease stage III, type 2 diabetes, hypertension, BPH, GERD, glaucoma and COPD; who presented to the emergency department secondary to fever, mechanical fall, changes in mentation and some headache.  Per family patient was doing great until the night prior to  admission and then suddenly very early in the morning after he woke up he experienced to episodes back to back of falling with the last one hitting his head; he has been complaining of feeling and some upper respiratory infection symptoms with some ongoing congestion and a recent use of Mucinex DM to try to break up loose some phlegm. No CP, no hemoptysis, no complaints of dysuria, nausea, vomiting or any other complaints.  Type of Study: Bedside Swallow Evaluation Previous Swallow Assessment: none found Diet Prior to this Study: NPO Temperature Spikes Noted: Yes Respiratory Status: Room air History of Recent Intubation: No Behavior/Cognition: Alert;Cooperative Oral Cavity Assessment: Within Functional Limits Oral Care Completed by SLP: Yes Oral Cavity - Dentition: Edentulous Vision: Functional for self-feeding Self-Feeding Abilities: Able to feed self;Needs assist Patient Positioning: Upright in bed Baseline Vocal Quality: Low vocal intensity Volitional Cough: Strong Volitional Swallow: Able to elicit    Oral/Motor/Sensory Function Overall Oral Motor/Sensory Function: Within functional limits   Ice Chips Ice chips: Within functional limits Presentation: Spoon   Thin Liquid Thin Liquid: Impaired Presentation: Cup;Spoon;Straw Pharyngeal  Phase Impairments: Throat Clearing - Delayed Other Comments: only with initial swallow via tsp    Nectar Thick Nectar Thick Liquid: Not tested   Honey Thick Honey Thick Liquid: Not tested   Puree Puree: Within functional limits Presentation: Spoon   Solid      Solid: Impaired Presentation: Self Fed Oral Phase Impairments: Impaired mastication Oral Phase Functional Implications: Impaired mastication        Elvina Sidle, M.S., CCC-SLP 07/28/2017,5:08 PM

## 2017-07-28 NOTE — Care Management Note (Signed)
Case Management Note  Patient Details  Name: Anthony Murray MRN: [id number] Date of Birth: [birth year]  Subjective/Objective:    Adm with acute encephalopathy and falls. From home with wife. Has cane, RW, and WC pta. No HH. Has PCP-Dr. [place] Crane. Reports no issues with transportation or obtaining medications. Uses [employer].                 Action/Plan:Recommended for [facility] PT. Agreeable and would like [facility]. Vaughan Basta of [facility] notified and will obtain orders from [place].   Expected Discharge Date:     07/29/2017             Expected Discharge Plan:  [place]  In-House Referral:     Discharge planning Services  CM Consult  Post Acute Care Choice:  Home Health, Durable Medical Equipment Choice offered to:  Patient  DME Arranged:  Walker rolling DME Agency:  [place]:  PT [place]:  [place]  Status of Service:  Completed, signed off  If discussed at [place] of Stay Meetings, dates discussed:    Additional Comments:  Jewelle Whitner, Chauncey [place], RN 07/28/2017, 2:16 PM

## 2017-07-28 NOTE — Consult Note (Signed)
[place] A. Merlene Laughter, MD     [email or web address]          Anthony Murray is an 84 y.o. male.   ASSESSMENT/PLAN: 1. Acute gait impairment/falls with MRI consistent with embolic strokes with abnormal echocardiography: The clinical picture is worrisome for endocarditis especially given fever 102 or intracardiac thrombus. Blood cultures are pending. Echocardiography will be obtained. Sedimentation rate and C-reactive protein will also be obtained. Continue with antiplatelet agents for now.  2. Examination suggest mild cervical myelopathy: Observe for now.     Patient is a 84 year old white male who presents with acute gait impairment and falls. It appears that he did not lose consciousness although there was reports of the patient being somewhat confused after the event. The patient apparently ambulates in his functional at baseline. He does not report having any difficulty speaking, loss of consciousness, dysphagia or abnormal movements. The review systems otherwise negative.   GENERAL: Very pleasant male who is in no acute distress.  HEENT:  neck is supple and trauma is not appreciated.  ABDOMEN: soft  EXTREMITIES: No edema; there is marked arthritic changes throughout    BACK: normal  SKIN: Normal by inspection.    MENTAL STATUS: Alert and oriented - including orientation to hospital, floor, month and age. Speech, language and cognition are generally intact. Judgment and insight normal.   CRANIAL NERVES: Pupils are equal, round and reactive to light and accomodation;  the pupils are pinpoint however, extra ocular movements are full, there is no significant nystagmus; visual fields are full; upper and lower facial muscles are normal in strength and symmetric,  he appears to have mild bilateral ptosis, there is no flattening of the nasolabial folds; tongue is midline; uvula is midline; shoulder elevation is normal.  MOTOR: The strength and bulk are normal  throughout. However, the patient seemed to have increased tone especially of the legs. There is no drift of the upper extremities or legs.  COORDINATION: Left finger to nose is normal, right finger to nose is normal, No rest tremor; no intention tremor; no postural tremor; no bradykinesia.  REFLEXES: Deep tendon reflexes are symmetrical and normal. Plantar reflexes are flexor bilaterally.   SENSATION: Normal to light touch, temperature, and pain. No neglect appreciated.    NIH stroke scale 0.   Blood pressure (!) 155/70, pulse 72, temperature 98.2 F (36.8 C), temperature source Oral, resp. rate 20, height '5\' 6"'  (1.676 m), weight 190 lb 0.6 oz (86.2 kg), SpO2 94 %.  Past Medical History:  Diagnosis Date  . Aortic valve stenosis   . ASCVD (arteriosclerotic cardiovascular disease)    50% ostial left left anterior desending 60% mid circumflex and normal ejection fractioon in 2006 exertional dyspnea;history of chest discomfort  . Cerebrovascular disease    minimal carotid bruits  . CKD (chronic kidney disease)   . Diabetes mellitus type 2, controlled ([place])    no insulin  . Hyperlipidemia    statin intolerant  . Hypertension   . Tobacco abuse, in remission    30 pack years stopped in 1971    Past Surgical History:  Procedure Laterality Date  . APPENDECTOMY    . CARPAL TUNNEL RELEASE     surgery left 05/05/2001  . KNEE ARTHROSCOPY     bilaterial; unknown associated surgical procedure  . SHOULDER SURGERY     Right x2; third procedure on left  . TONSILLECTOMY    . TRANSURETHRAL INCISION OF PROSTATE     resection of the  prostate    History reviewed. No pertinent family history.  Social History:  reports that he quit smoking about 47 years ago. His smoking use included cigarettes. He has a 40.00 pack-year smoking history. he has never used smokeless tobacco. He reports that he does not drink alcohol or use drugs.  Allergies:  Allergies  Allergen Reactions  . Codeine   .  Ezetimibe-Simvastatin     REACTION: myalgias  . Naproxen     REACTION: and all nonsteroidals  . Niacin   . Pravastatin Sodium     REACTION: myalgias no    Medications: Prior to Admission medications   Medication Sig Start Date End Date Taking? Authorizing Provider  acetaminophen (TYLENOL) 325 MG tablet Take 650 mg by mouth every 4 (four) hours as needed.   Yes [provider]  albuterol (PROVENTIL HFA;VENTOLIN HFA) 108 (90 Base) MCG/ACT inhaler Inhale 2 puffs into the lungs every 4 (four) hours as needed for wheezing or shortness of breath.    Yes [provider]  aspirin 81 MG tablet Take 81 mg by mouth 2 (two) times daily.    Yes [provider]  betaxolol (BETOPTIC-S) 0.5 % ophthalmic suspension Place 1 drop into both eyes every morning.  12/15/15  Yes [provider]  bimatoprost (LUMIGAN) 0.01 % SOLN Place 1 drop into both eyes daily.   Yes [provider]  carvedilol (COREG) 12.5 MG tablet Take 12.5 mg by mouth 2 (two) times daily with a meal.  09/28/12  Yes [provider]  Cholecalciferol 1000 units tablet Take 1,000 Units by mouth daily.   Yes [provider]  Cranberry 500 MG CAPS Take 1 capsule by mouth daily.   Yes [provider]  dorzolamide (TRUSOPT) 2 % ophthalmic solution Place 1 drop into the left eye 2 (two) times daily.     Yes [provider]  Fluticasone-Umeclidin-Vilant (TRELEGY ELLIPTA) 100-62.5-25 MCG/INH AEPB Inhale 1 puff into the lungs daily.   Yes [provider]  furosemide (LASIX) 20 MG tablet Take 20 mg by mouth daily.   Yes [provider]  glipiZIDE (GLUCOTROL) 5 MG tablet Take 5 mg by mouth daily before breakfast.   Yes [provider]  iron polysaccharides (NIFEREX) 150 MG capsule Take 150 mg by mouth daily.   Yes [provider]  Netarsudil Dimesylate (RHOPRESSA) 0.02 % SOLN Apply 1 drop to eye at bedtime. Left eye   Yes [provider]  pantoprazole (PROTONIX) 40 MG tablet Take 40 mg by mouth daily.   Yes [provider]  pilocarpine (PILOCAR) 2 % ophthalmic solution Place 1 drop into the left eye 4 (four) times daily.    Yes [provider]  potassium chloride (K-DUR) 10 MEQ tablet Take 10 mEq by mouth daily.   Yes [provider]  sitaGLIPtin (JANUVIA) 50 MG tablet Take 50 mg by mouth daily.   Yes [provider]  tamsulosin (FLOMAX) 0.4 MG CAPS capsule Take 0.4 mg by mouth daily.   Yes [provider]  traMADol (ULTRAM) 50 MG tablet Take 50 mg by mouth every 6 (six) hours as needed.   Yes [provider]    Scheduled Meds: .  stroke: mapping our early stages of recovery book   Does not apply Once  . aspirin  300 mg Rectal Daily   Or  . aspirin  325 mg Oral Daily  . betaxolol  1 drop Both Eyes [id number]  . carvedilol  12.5 mg  Oral BID WC  . dorzolamide  1 drop Left Eye BID  . fluticasone  1 spray Each Nare Daily  . fluticasone furoate-vilanterol  1 puff Inhalation Daily  . heparin  5,000 Units Subcutaneous Q8H  . insulin aspart  0-9 Units Subcutaneous TID WC  . insulin detemir  5 Units Subcutaneous QHS  . latanoprost  1 drop Both Eyes Daily  . loratadine  10 mg Oral Daily  . Netarsudil Dimesylate  1 drop Ophthalmic QHS  . omega-3 acid ethyl esters  1 g Oral BID  . pantoprazole  40 mg Oral Daily  . pilocarpine  1 drop Left Eye QID  . tamsulosin  0.4 mg Oral Daily  . umeclidinium bromide  1 puff Inhalation Daily   Continuous Infusions: . sodium chloride 100 mL/hr (07/27/17 2200)  . cefTRIAXone (ROCEPHIN)  IV Stopped (07/28/17 0000)   PRN Meds:.acetaminophen **OR** acetaminophen, ipratropium-albuterol     Results for orders placed or performed during the hospital encounter of 07/27/17 (from the past 48 hour(s))  Urinalysis, Routine w reflex microscopic     Status: Abnormal   Collection Time: 07/27/17  7:18 AM  Result Value Ref Range   Color, Urine  YELLOW YELLOW   APPearance CLEAR CLEAR   Specific Gravity, Urine 1.013 1.005 - 1.030   pH 8.0 5.0 - 8.0   Glucose, UA NEGATIVE NEGATIVE mg/dL   Hgb urine dipstick NEGATIVE NEGATIVE   Bilirubin Urine NEGATIVE NEGATIVE   Ketones, ur NEGATIVE NEGATIVE mg/dL   Protein, ur >=300 (A) NEGATIVE mg/dL   Nitrite NEGATIVE NEGATIVE   Leukocytes, UA NEGATIVE NEGATIVE   RBC / HPF 0-5 0 - 5 RBC/hpf   WBC, UA 0-5 0 - 5 WBC/hpf   Bacteria, UA NONE SEEN NONE SEEN   Squamous Epithelial / LPF NONE SEEN NONE SEEN   Mucus PRESENT     Comment: Performed at [facility], [address], [place], [place] [address]  Influenza panel by PCR (type A & B)     Status: None   Collection Time: 07/27/17  9:30 AM  Result Value Ref Range   Influenza A By PCR NEGATIVE NEGATIVE   Influenza B By PCR NEGATIVE NEGATIVE    Comment: (NOTE) The Xpert Xpress Flu assay is intended as an aid in the diagnosis of  influenza and should not be used as a sole basis for treatment.  This  assay is FDA approved for nasopharyngeal swab specimens only. Nasal  washings and aspirates are unacceptable for Xpert Xpress Flu testing. Performed at [facility], [address], [place], [place] [address]   CBC with Differential     Status: Abnormal   Collection Time: 07/27/17 10:28 AM  Result Value Ref Range   WBC 9.5 4.0 - 10.5 K/uL   RBC 5.59 4.22 - 5.81 MIL/uL   Hemoglobin 15.9 13.0 - 17.0 g/dL   HCT 49.5 39.0 - 52.0 %   MCV 88.6 78.0 - 100.0 fL   MCH 28.4 26.0 - 34.0 pg   MCHC 32.1 30.0 - 36.0 g/dL   RDW 16.2 (H) 11.5 - 15.5 %   Platelets 158 150 - 400 K/uL   Neutrophils Relative % 76 %   Neutro Abs 7.2 1.7 - 7.7 K/uL   Lymphocytes Relative 16 %   Lymphs Abs 1.5 0.7 - 4.0 K/uL   Monocytes Relative 7 %   Monocytes Absolute 0.7 0.1 - 1.0 K/uL   Eosinophils Relative 1 %   Eosinophils Absolute 0.1 0.0 - 0.7 K/uL  Basophils Relative 0 %   Basophils Absolute 0.0 0.0 - 0.1 K/uL    Comment: Performed at [facility], [address] [place], [place] [address]  Basic metabolic panel     Status: Abnormal   Collection Time: 07/27/17 10:28 AM  Result Value Ref Range   Sodium 139 135 - 145 mmol/L   Potassium 3.7 3.5 - 5.1 mmol/L   Chloride 103 101 - 111 mmol/L   CO2 25 22 - 32 mmol/L   Glucose, Bld 151 (H) 65 - 99 mg/dL   BUN 19 6 - 20 mg/dL   Creatinine, Ser 1.38 (H) 0.61 - 1.24 mg/dL   Calcium 9.0 8.9 - 10.3 mg/dL   GFR calc non Af Amer 44 (L) >60 mL/min   GFR calc Af Amer 51 (L) >60 mL/min    Comment: (NOTE) The eGFR has been calculated using the CKD EPI equation. This calculation has not been validated in all clinical situations. eGFR's persistently <60 mL/min signify possible Chronic Kidney Disease.    Anion gap 11 5 - 15    Comment: Performed at [facility], [address], [place], [place] [address]  Lactic acid, plasma     Status: None   Collection Time: 07/27/17 10:28 AM  Result Value Ref Range   Lactic Acid, Venous 1.3 0.5 - 1.9 mmol/L    Comment: Performed at [facility], [address] [place], [place] [address]  CSF cell count with differential collection tube #: 1     Status: Abnormal   Collection Time: 07/27/17 11:19 AM  Result Value Ref Range   Tube # 1    Color, CSF STRAW (A) COLORLESS   Appearance, CSF CLOUDY (A) CLEAR   Supernatant COLORLESS    RBC Count, CSF 2,050 (H) 0 /cu mm   WBC, CSF 4 0 - 5 /cu mm   Segmented Neutrophils-CSF TOO FEW TO COUNT, SMEAR AVAILABLE FOR REVIEW 0 - 6 %   Lymphs, CSF TOO FEW TO COUNT, SMEAR AVAILABLE FOR REVIEW 40 - 80 %   Monocyte-Macrophage-Spinal Fluid TOO FEW TO COUNT, SMEAR AVAILABLE FOR REVIEW 15 - 45 %   Eosinophils, CSF TOO FEW TO COUNT, SMEAR AVAILABLE FOR REVIEW 0 - 1 %   Other Cells, CSF TOO FEW TO COUNT, SMEAR AVAILABLE FOR REVIEW     Comment: Performed at [facility], [address], [place], [place] [address]  Vitamin B12     Status: None   Collection Time: 07/27/17 12:40 PM  Result Value Ref Range   Vitamin B-12 275 180 - 914 pg/mL     Comment: (NOTE) This assay is not validated for testing neonatal or myeloproliferative syndrome specimens for Vitamin B12 levels. Performed at [facility], [place] [address], [place], [place] [address]   Lactic acid, plasma     Status: None   Collection Time: 07/27/17 12:42 PM  Result Value Ref Range   Lactic Acid, Venous 1.4 0.5 - 1.9 mmol/L    Comment: Performed at [facility], [address], [place], [place] [address]  TSH     Status: None   Collection Time: 07/27/17 12:42 PM  Result Value Ref Range   TSH 3.050 0.350 - 4.500 uIU/mL    Comment: Performed by a 3rd Generation assay with a functional sensitivity of <=0.01 uIU/mL. Performed at [facility], [address] [place], [place] [address]   RPR     Status: None   Collection Time: 07/27/17 12:42 PM  Result Value Ref  Range   RPR Ser Ql Non Reactive Non Reactive    Comment: (NOTE) Performed At: [facility] [place], [place] [id number] Rush Farmer MD [id number] Performed at [facility], [address], [place], [place] [address]   Culture, blood (Routine X 2) w Reflex to ID Panel     Status: None (Preliminary result)   Collection Time: 07/27/17 12:42 PM  Result Value Ref Range   Specimen Description BLOOD LEFT FOREARM    Special Requests      BOTTLES DRAWN AEROBIC AND ANAEROBIC Blood Culture adequate volume   Culture      NO GROWTH < 24 HOURS Performed at [facility], [address] [place], [place] [address]    Report Status PENDING   HIV antibody (routine testing) (NOT for [facility])     Status: None   Collection Time: 07/27/17 12:42 PM  Result Value Ref Range   HIV Screen 4th Generation wRfx Non Reactive Non Reactive    Comment: (NOTE) Performed At: [facility] [place], [place] [id number] Rush Farmer MD [id number] Performed at [facility], [address] [place], [place] [address]   Culture, blood (Routine X 2) w Reflex to ID Panel     Status: None  (Preliminary result)   Collection Time: 07/27/17 12:43 PM  Result Value Ref Range   Specimen Description BLOOD LEFT WRIST    Special Requests      BOTTLES DRAWN AEROBIC ONLY Blood Culture adequate volume   Culture      NO GROWTH < 24 HOURS Performed at [facility], [address], [place], [place] [address]    Report Status PENDING   Glucose, CSF     Status: Abnormal   Collection Time: 07/27/17  3:12 PM  Result Value Ref Range   Glucose, CSF 84 (H) 40 - 70 mg/dL    Comment: Performed at [facility], [address], [place], [place] [address]  Protein, CSF     Status: Abnormal   Collection Time: 07/27/17  3:12 PM  Result Value Ref Range   Total  Protein, CSF 55 (H) 15 - 45 mg/dL    Comment: Performed at [facility], [address] [place], [place] [address]  CSF cell count with differential     Status: Abnormal   Collection Time: 07/27/17  3:12 PM  Result Value Ref Range   Tube # 4    Color, CSF COLORLESS COLORLESS   Appearance, CSF CLEAR CLEAR   Supernatant COLORLESS    RBC Count, CSF 148 (H) 0 /cu mm   WBC, CSF 0 0 - 5 /cu mm    Comment: Performed at [facility], [address] [place], [place] [address]  CSF culture     Status: None (Preliminary result)   Collection Time: 07/27/17  3:12 PM  Result Value Ref Range   Specimen Description      CSF Performed at [facility], [address] [place], [place] [address]    Special Requests      NONE Performed at [facility], [address] [place], [place] [address]    Gram Stain      NO ORGANISMS SEEN CYTOSPIN SMEAR Performed at [facility] NO WBC SEEN Performed at [facility], [address], [place], [place] [address]    Culture      NO GROWTH < 24 HOURS Performed at [facility], [place] [address], [place],  [address]    Report Status PENDING  Hemoglobin A1c     Status: Abnormal   Collection Time: 07/27/17  4:00 PM  Result Value Ref Range   Hgb A1c MFr Bld 6.6 (H) 4.8 - 5.6 %    Comment:  (NOTE) Pre diabetes:          5.7%-6.4% Diabetes:              >6.4% Glycemic control for   <7.0% adults with diabetes    Mean Plasma Glucose 142.72 mg/dL    Comment: Performed at [place] [address], [place], [place] [address]  CBG monitoring, ED     Status: Abnormal   Collection Time: 07/27/17  4:23 PM  Result Value Ref Range   Glucose-Capillary 113 (H) 65 - 99 mg/dL  MRSA PCR Screening     Status: None   Collection Time: 07/27/17 11:36 PM  Result Value Ref Range   MRSA by PCR NEGATIVE NEGATIVE    Comment:        The GeneXpert MRSA Assay (FDA approved for NASAL specimens only), is one component of a comprehensive MRSA colonization surveillance program. It is not intended to diagnose MRSA infection nor to guide or monitor treatment for MRSA infections. Performed at [facility], [address], [place], [place] [address]   Glucose, capillary     Status: None   Collection Time: 07/27/17 11:48 PM  Result Value Ref Range   Glucose-Capillary 87 65 - 99 mg/dL   Comment 1 Notify RN    Comment 2 Document in Chart   CBC     Status: Abnormal   Collection Time: 07/28/17  4:01 AM  Result Value Ref Range   WBC 6.7 4.0 - 10.5 K/uL   RBC 4.77 4.22 - 5.81 MIL/uL   Hemoglobin 13.6 13.0 - 17.0 g/dL   HCT 42.9 39.0 - 52.0 %   MCV 89.9 78.0 - 100.0 fL   MCH 28.5 26.0 - 34.0 pg   MCHC 31.7 30.0 - 36.0 g/dL   RDW 16.4 (H) 11.5 - 15.5 %   Platelets 130 (L) 150 - 400 K/uL    Comment: Performed at [facility], [address], [place], [place] [address]  Basic metabolic panel     Status: Abnormal   Collection Time: 07/28/17  4:01 AM  Result Value Ref Range   Sodium 138 135 - 145 mmol/L   Potassium 3.3 (L) 3.5 - 5.1 mmol/L   Chloride 106 101 - 111 mmol/L   CO2 24 22 - 32 mmol/L   Glucose, Bld 91 65 - 99 mg/dL   BUN 17 6 - 20 mg/dL   Creatinine, Ser 1.41 (H) 0.61 - 1.24 mg/dL   Calcium 8.0 (L) 8.9 - 10.3 mg/dL   GFR calc non Af Amer 43 (L) >60 mL/min   GFR calc Af Amer  50 (L) >60 mL/min    Comment: (NOTE) The eGFR has been calculated using the CKD EPI equation. This calculation has not been validated in all clinical situations. eGFR's persistently <60 mL/min signify possible Chronic Kidney Disease.    Anion gap 8 5 - 15    Comment: Performed at [facility], [address], [place], [place] [address]  Lipid panel     Status: Abnormal   Collection Time: 07/28/17  4:01 AM  Result Value Ref Range   Cholesterol 164 0 - 200 mg/dL   Triglycerides 156 (H) <150 mg/dL   HDL 25 (L) >40 mg/dL   Total CHOL/HDL Ratio 6.6 RATIO  VLDL 31 0 - 40 mg/dL   LDL Cholesterol 108 (H) 0 - 99 mg/dL    Comment:        Total Cholesterol/HDL:CHD Risk Coronary Heart Disease Risk Table                     Men   Women  1/2 Average Risk   3.4   3.3  Average Risk       5.0   4.4  2 X Average Risk   9.6   7.1  3 X Average Risk  23.4   11.0        Use the calculated Patient Ratio above and the CHD Risk Table to determine the patient's CHD Risk.        ATP III CLASSIFICATION (LDL):  <100     mg/dL   Optimal  100-129  mg/dL   Near or Above                    Optimal  130-159  mg/dL   Borderline  160-189  mg/dL   High  >190     mg/dL   Very High Performed at [place] [place], [place] [address]   Glucose, capillary     Status: None   Collection Time: 07/28/17  7:58 AM  Result Value Ref Range   Glucose-Capillary 87 65 - 99 mg/dL  Glucose, capillary     Status: None   Collection Time: 07/28/17 11:02 AM  Result Value Ref Range   Glucose-Capillary 99 65 - 99 mg/dL  Glucose, capillary     Status: None   Collection Time: 07/28/17  3:51 PM  Result Value Ref Range   Glucose-Capillary 95 65 - 99 mg/dL   Comment 1 Notify RN    Comment 2 Document in Chart     Studies/Results:  CAROTID DOPPLERS  rIGHT Color duplex indicates moderate heterogeneous plaque, with no hemodynamically significant stenosis by duplex criteria in the extracranial  cerebrovascular circulation.  Left:  Heterogeneous plaque at the left carotid bifurcation contributes to 50%-69% stenosis by established duplex criteria. Velocity 223    ICC/CCA 2.2      ECHO Study Conclusions  - Left ventricle: The cavity size was normal. Wall thickness was   increased in a pattern of mild LVH. Systolic function was normal.   The estimated ejection fraction was in the range of 55% to 60%.   Wall motion was normal; there were no regional wall motion   abnormalities. Doppler parameters are consistent with abnormal   left ventricular relaxation (grade 1 diastolic dysfunction). - Aortic valve: Functionally bicuspid; moderately calcified   leaflets. There was moderate to severe stenosis. There was mild   regurgitation. Mean gradient (S): 11 mm Hg. Peak gradient (S): 20   mm Hg. VTI ratio of LVOT to aortic valve: 0.35. Valve area (VTI):   0.88 cm^2. - Mitral valve: Mildly calcified annulus. There was mild   regurgitation. There is an intermittently seen echodensity seen   on the atrial side of the anterior mitral leaflet concerning for   vegetation in patient with history of fever and stroke. - Left atrium: The atrium was mildly dilated. - Right atrium: Central venous pressure (est): 8 mm Hg. - Tricuspid valve: There was trivial regurgitation. - Pulmonary arteries: Systolic pressure could not be accurately   estimated. - Pericardium, extracardiac: There was no pericardial effusion.  Impressions:  - There is an intermittently seen echodensity seen on  the atrial   side of the anterior mitral leaflet concerning for vegetation in   patient with history of fever and stroke.      BRAIN MRA MRI FINDINGS: MRI HEAD FINDINGS  The examination had to be discontinued prior to completion due to patient's mental status and inability to tolerate the examination despite receiving medication. Axial and coronal diffusion, sagittal T1, and axial T2 sequences were  obtained and are moderately motion degraded.  Brain: Small foci of acute cortical/subcortical infarction are noted in the right frontal and right occipital lobes, and there is also a small infarct in the posterior right centrum semiovale. No gross intracranial hemorrhage, mass/mass effect, or extra-axial fluid collection is identified. There is advanced cerebral atrophy. Chronic lacunar infarcts are noted in the deep cerebral white matter bilaterally as well as likely in the right pons. Confluent T2 hyperintensity throughout the cerebral white matter is nonspecific but compatible with extensive chronic small vessel ischemic disease.  Vascular: Major intracranial vascular flow voids are preserved.  Skull and upper cervical spine: Grossly unremarkable bone marrow signal. Moderate pannus about the dens resulting in mild upper cervical spinal stenosis but without evidence of cord compression.  Sinuses/Orbits: Bilateral cataract extraction. Trace right sphenoid sinus fluid. Clear mastoid air cells.  Other: None.  MRA HEAD FINDINGS  The study is moderately to severely motion degraded.  The vertebral arteries are patent to the basilar with the left being dominant. The basilar artery is patent without gross high-grade stenosis. The proximal right P1 segment is patent, however there is loss of flow related enhancement in the distal P1 segment which is somewhat asymmetric compared to the contralateral side and which could reflect artifactual signal loss versus occlusion or high-grade stenosis. The left P1 segment is grossly patent with presumably artifactual signal loss of the P2 and more distal PCA branches.  The internal carotid arteries are patent from skull base to carotid termini with limited assessment for stenosis due to artifact. The proximal to mid M1 segments are patent bilaterally, however severe artifact limits assessment of the MCA bifurcations and branch vessels.  The right A1 segment in both A2 segments are grossly patent. The left A1 segment is not well seen, potentially small and obscured by motion.  IMPRESSION: 1. Motion degraded, incomplete examination. 2. Scattered small acute cortical and white matter infarcts in the right cerebral hemisphere. 3. Advanced chronic small vessel ischemic disease and cerebral atrophy. 4. Severely motion degraded head MRA as above.          THE BRAIN MRI IS REVIEWED IN PERSON.  MRA IS ALSO REVIEWED.  There are several increased signal seen on DWI involving the cortex.  These are small lesion with embolic like the pattern involving the right frontal lobe particular in the parasagittal distribution but also out laterally.  There is also a single lesion seen in the occipital pole.  These also suggest multiple vascular distribution and likely embolic phenomena.  There is moderately severe periventricular leukoencephalopathy.  MRA is markedly degraded by motion artifact.  However no clear lesions are appreciated or can be appreciated.  There are some luminal irregularities involving the MCA and ACA.      Allysen Lazo A. Merlene Laughter, M.D.  Diplomate, Tax adviser of Psychiatry and Neurology ( Neurology). 07/28/2017, 8:27 PM

## 2017-07-28 NOTE — Plan of Care (Signed)
  Acute Rehab PT Goals(only PT should resolve) Pt Will Go Supine/Side To Sit 07/28/2017 1129 - Progressing by Lonell Grandchild, PT Flowsheets Taken 07/28/2017 1129  Pt will go Supine/Side to Sit with min guard assist Patient Will Transfer Sit To/From Stand 07/28/2017 1129 - Progressing by Lonell Grandchild, PT Flowsheets Taken 07/28/2017 1129  Patient will transfer sit to/from stand with min guard assist Pt Will Transfer Bed To Chair/Chair To Bed 07/28/2017 1129 - Progressing by Lonell Grandchild, PT Flowsheets Taken 07/28/2017 1129  Pt will Transfer Bed to Chair/Chair to Bed min guard assist Pt Will Ambulate 07/28/2017 1129 - Progressing by Lonell Grandchild, PT Flowsheets Taken 07/28/2017 1129  Pt will Ambulate with min guard assist;25 feet;with rolling walker  11:30 AM, 07/28/17 Lonell Grandchild, MPT Physical Therapist with [facility] [phone] office [phone] mobile phone

## 2017-07-29 ENCOUNTER — Encounter (HOSPITAL_COMMUNITY): Payer: Self-pay | Admitting: *Deleted

## 2017-07-29 ENCOUNTER — Inpatient Hospital Stay (HOSPITAL_COMMUNITY): Payer: PPO | Admitting: Anesthesiology

## 2017-07-29 ENCOUNTER — Encounter (HOSPITAL_COMMUNITY)
Admission: EM | Disposition: A | Discharge status: Home Health | Payer: Self-pay | Source: Home / Self Care | Attending: Internal Medicine

## 2017-07-29 ENCOUNTER — Inpatient Hospital Stay (HOSPITAL_COMMUNITY): Payer: PPO

## 2017-07-29 DIAGNOSIS — I350 Nonrheumatic aortic (valve) stenosis: Secondary | ICD-10-CM

## 2017-07-29 DIAGNOSIS — I339 Acute and subacute endocarditis, unspecified: Secondary | ICD-10-CM

## 2017-07-29 DIAGNOSIS — I358 Other nonrheumatic aortic valve disorders: Secondary | ICD-10-CM

## 2017-07-29 DIAGNOSIS — R9439 Abnormal result of other cardiovascular function study: Secondary | ICD-10-CM

## 2017-07-29 DIAGNOSIS — I330 Acute and subacute infective endocarditis: Secondary | ICD-10-CM

## 2017-07-29 HISTORY — PX: TEE WITHOUT CARDIOVERSION: SHX5443

## 2017-07-29 LAB — C-REACTIVE PROTEIN: CRP: 2.4 mg/dL — AB (ref <1.0)

## 2017-07-29 LAB — GLUCOSE, CAPILLARY
GLUCOSE-CAPILLARY: 139 mg/dL — AB (ref 65–99)
GLUCOSE-CAPILLARY: 151 mg/dL — AB (ref 65–99)
Glucose-Capillary: 153 mg/dL — ABNORMAL HIGH (ref 65–99)
Glucose-Capillary: 135 mg/dL — ABNORMAL HIGH (ref 65–99)
Glucose-Capillary: 128 mg/dL — ABNORMAL HIGH (ref 65–99)
Glucose-Capillary: 126 mg/dL — ABNORMAL HIGH (ref 65–99)

## 2017-07-29 LAB — URINE CULTURE

## 2017-07-29 LAB — CREATININE, SERUM
Creatinine, Ser: 1.46 mg/dL — ABNORMAL HIGH (ref 0.61–1.24)
GFR calc Af Amer: 48 mL/min — ABNORMAL LOW (ref >60)
GFR, EST NON AFRICAN AMERICAN: 41 mL/min — AB (ref >60)

## 2017-07-29 LAB — SEDIMENTATION RATE: SED RATE: 18 mm/h — AB (ref 0–16)

## 2017-07-29 SURGERY — ECHOCARDIOGRAM, TRANSESOPHAGEAL
Anesthesia: Monitor Anesthesia Care

## 2017-07-29 MED ORDER — CEFAZOLIN SODIUM-DEXTROSE 2-4 GM/100ML-% IV SOLN
2.0000 g | Freq: Three times a day (TID) | INTRAVENOUS | Status: DC
  Administered 2017-07-29: 2 g via INTRAVENOUS
  Filled 2017-07-29 (×10): qty 100

## 2017-07-29 MED ORDER — IPRATROPIUM-ALBUTEROL 0.5-2.5 (3) MG/3ML IN SOLN
RESPIRATORY_TRACT | Status: AC
  Filled 2017-07-29: qty 3

## 2017-07-29 MED ORDER — LIDOCAINE VISCOUS 2 % MT SOLN
OROMUCOSAL | Status: AC
  Filled 2017-07-29: qty 15

## 2017-07-29 MED ORDER — ASPIRIN EC 81 MG PO TBEC
81.0000 mg | DELAYED_RELEASE_TABLET | Freq: Two times a day (BID) | ORAL | Status: DC
  Administered 2017-07-29 (×2): 81 mg via ORAL
  Filled 2017-07-29 (×3): qty 1

## 2017-07-29 MED ORDER — SODIUM CHLORIDE 0.9 % IV SOLN
3.0000 g | Freq: Three times a day (TID) | INTRAVENOUS | Status: DC
  Administered 2017-07-29 – 2017-07-30 (×3): 3 g via INTRAVENOUS
  Filled 2017-07-29 (×9): qty 3

## 2017-07-29 MED ORDER — HALOPERIDOL LACTATE 5 MG/ML IJ SOLN: 1.0000 mg | Freq: Three times a day (TID) | INTRAMUSCULAR | Status: DC | PRN

## 2017-07-29 MED ORDER — PROPOFOL 500 MG/50ML IV EMUL
INTRAVENOUS | Status: DC | PRN
  Administered 2017-07-29: 50 ug/kg/min via INTRAVENOUS

## 2017-07-29 MED ORDER — LIDOCAINE VISCOUS 2 % MT SOLN
5.0000 mL | Freq: Once | OROMUCOSAL | Status: AC
  Administered 2017-07-29: 5 mL via OROMUCOSAL

## 2017-07-29 MED ORDER — MIDAZOLAM HCL 2 MG/2ML IJ SOLN: 1.0000 mg | INTRAMUSCULAR | Status: DC

## 2017-07-29 MED ORDER — PROPOFOL 10 MG/ML IV BOLUS
INTRAVENOUS | Status: DC | PRN
  Administered 2017-07-29 (×2): 10 mg via INTRAVENOUS
  Administered 2017-07-29 (×2): 20 mg via INTRAVENOUS

## 2017-07-29 MED ORDER — LACTATED RINGERS IV SOLN
INTRAVENOUS | Status: DC
  Administered 2017-07-29: 09:00:00 via INTRAVENOUS

## 2017-07-29 NOTE — Progress Notes (Signed)
[facility] PROGRESS NOTE  Anthony Murray FTD:[id number] DOB: April 11, 1928 DOA: 07/27/2017 PCP: Phillips Odor, MD  Interim summary and HPI 84 year old male with a past medical history significant for chronic kidney disease stage III, type 2 diabetes, hypertension, BPH, GERD, glaucoma and COPD; who presented to the emergency department secondary to fever, mechanical fall, changes in mentation and some headache.  Met SIRS/sepsis criteria on admission; work up demonstrated septic emboli and positive endocarditis and aortic vegetation.    Assessment/Plan: 1-Acute encephalopathy: etiology unclear, but most likely associated with acute septic emboli from endocarditis. -MRI motion degraded but suggesting acute/subacute lesions in cortical/white matter -appreciate assistance from neurology service -continue treatment with antibiotics for endocarditis (see below) -HIV and RPR non reactive, B12 and TSH WNL -will discontinue vancomycin and valtrex -now on unasyn  -patient w/o neck rigidity and currently without fever  -follow clinical response   2-Sepsis: due to endocarditis -patient with fever, tachycardia and mild elevation on RR on admission. -positive endocarditis and vegetation appreciated on 2-D echo and TEE -most likely autonomic response from acute stroke  -follow final blood cx's results (so far 48 hours, no growth and patient afebrile) -stop vancomycin and valtrex -continue unasyn as recommended by ID -will place PICC  3-HLD (hyperlipidemia) -LDL 108 -with intolerance to statins -continue lovaza  4-Essential hypertension -Blood pressure stable. -continue current antihypertensive regimen.  5-prior hx of CEREBROVASCULAR DISEASE: now with new 2 different focal lesions affecting cortical and white matter. -continue full dose ASA for now -neurology consulted -Presentation compatible with septic emboli due to endocarditis. -Antibiotics as recommended by ID.   6-Type 2  diabetes mellitus with complications (HCC) -continue lantus and SSI -Continue modified carbohydrate diet  7-CKD (chronic kidney disease), stage III (HCC) -Creatinine has remained at 1.4 range (which is baseline) -Will follow renal function trend.  8-Glaucoma -denying eye pain currently -Continue home eyedrop regimen. -per family, patient to have eye surgery next week; which will need to be rescheduled.  9-BPH -Stable and unchanged -Continue Flomax.   -No signs of acute urinary retention symptoms   10-sinusitis -continue flonase and loratadine -Will be receiving Unasyn  -Continue supportive care.  Code Status: Full Family Communication: granddaughter at bedside  Disposition Plan: Remains inpatient.  Patient S/P TEE confirming endocarditis/aortic vegetation. Follow neurology recommendations and ID recommendations.   Consultants:  Neurology  Cardiology  ID (Dr. Megan Salon)  Procedures:  See below for x-ray reports   Echo: with positive aortic vegetation  Carotid duplex: Right: Color duplex indicates moderate heterogeneous plaque, with no hemodynamically significant stenosis by duplex criteria.  Left: Heterogeneous plaque at the left carotid bifurcation contributes to 50%-69% stenosis   TEE: Positive aortic vegetation, preserved ejection fraction, no wall motion abnormalities.  Antibiotics:  Vancomycin and valtrex 07/27/17>>>07/28/17  Rocephin 07/27/17>>>07/29/17  Unasyn 2/8   HPI/Subjective: Afebrile, no chest pain, no shortness of breath.  Currently oriented x2, but experiencing with agitation.  Objective: Vitals:   07/29/17 1120 07/29/17 1520  BP: (!) 188/83 (!) 166/82  Pulse: 69 72  Resp: 18 18  Temp: 98 F (36.7 C) 99 F (37.2 C)  SpO2: 96% 96%    Intake/Output Summary (Last 24 hours) at 07/29/2017 1700 Last data filed at 07/29/2017 1018 Gross per 24 hour  Intake 500 ml  Output 1775 ml  Net -1275 ml   Filed Weights   07/27/17 2131 07/29/17 0300  07/29/17 0900  Weight: 86.2 kg (190 lb 0.6 oz) 86 kg (189 lb 9.5 oz) 85.7 kg (189 lb)  Exam:   General: No fever, patient oriented x2, but with episode of agitation and confusion.  Able to follow commands.  No chest pain, no shortness of breath, no palpitations, no nausea, no vomiting.   Cardiovascular: S1 and S2, no rubs, no gallops, no appreciated murmur on exam.  No JVD   Respiratory: Good air movement bilaterally, slightly expiratory wheezing, no rhonchi's, no crackles.    Abdomen: Soft, nontender, nondistended, positive bowel sounds.  S  Musculoskeletal: No edema, no cyanosis, no clubbing.   Neurology: Patient without focal motor deficit.    Data Reviewed: Basic Metabolic Panel: Recent Labs  Lab 07/27/17 1028 07/28/17 0401 07/29/17 0629  NA 139 138  --   K 3.7 3.3*  --   CL 103 106  --   CO2 25 24  --   GLUCOSE 151* 91  --   BUN 19 17  --   CREATININE 1.38* 1.41* 1.46*  CALCIUM 9.0 8.0*  --    CBC: Recent Labs  Lab 07/27/17 1028 07/28/17 0401  WBC 9.5 6.7  NEUTROABS 7.2  --   HGB 15.9 13.6  HCT 49.5 42.9  MCV 88.6 89.9  PLT 158 130*   CBG: Recent Labs  Lab 07/29/17 0742 07/29/17 0850 07/29/17 1033 07/29/17 1140 07/29/17 1605  GLUCAP 139* 151* 153* 135* 128*    Recent Results (from the past 240 hour(s))  Culture, Urine     Status: Abnormal   Collection Time: 07/27/17  7:20 AM  Result Value Ref Range Status   Specimen Description   Final    URINE, RANDOM Performed at [facility], [address], [place], [place] [address]    Special Requests   Final    NONE Performed at [facility], [address], [place], [place] [address]    Culture MULTIPLE SPECIES PRESENT, SUGGEST RECOLLECTION (A)  Final   Report Status 07/29/2017 FINAL  Final  Culture, blood (Routine X 2) w Reflex to ID Panel     Status: None (Preliminary result)   Collection Time: 07/27/17 12:42 PM  Result Value Ref Range Status   Specimen Description BLOOD LEFT FOREARM  Final    Special Requests   Final    BOTTLES DRAWN AEROBIC AND ANAEROBIC Blood Culture adequate volume   Culture   Final    NO GROWTH 2 DAYS Performed at [facility], [address] [place], [place] [address]    Report Status PENDING  Incomplete  Culture, blood (Routine X 2) w Reflex to ID Panel     Status: None (Preliminary result)   Collection Time: 07/27/17 12:43 PM  Result Value Ref Range Status   Specimen Description BLOOD LEFT WRIST  Final   Special Requests   Final    BOTTLES DRAWN AEROBIC ONLY Blood Culture adequate volume   Culture   Final    NO GROWTH 2 DAYS Performed at [facility], [address] [place], [place] [address]    Report Status PENDING  Incomplete  CSF culture     Status: None (Preliminary result)   Collection Time: 07/27/17  3:12 PM  Result Value Ref Range Status   Specimen Description   Final    CSF Performed at [facility] [address], [place], [place] [address]    Special Requests   Final    NONE Performed at [facility], [address] [place],  [address]    Gram Stain   Final    NO ORGANISMS SEEN CYTOSPIN SMEAR Performed at [facility] NO WBC SEEN Performed at [facility], [address], [place], [place] [address]    Culture   Final    NO GROWTH 1 DAY Performed at [facility] [address], [place], [place] [address]    Report Status PENDING  Incomplete  MRSA PCR Screening     Status: None   Collection Time: 07/27/17 11:36 PM  Result Value Ref Range Status   MRSA by PCR NEGATIVE NEGATIVE Final    Comment:        The GeneXpert MRSA Assay (FDA approved for NASAL specimens only), is one component of a comprehensive MRSA colonization surveillance program. It is not intended to diagnose MRSA infection nor to guide or monitor treatment for MRSA infections. Performed at [facility], [address], [place], [place] [address]      Studies: US Carotid Bilateral (at [facility] Only)  Result Date:  07/28/2017 CLINICAL DATA:  84 year old male with a history of vascular disease. Cardiovascular risk factors include hypertension EXAM: BILATERAL CAROTID DUPLEX ULTRASOUND TECHNIQUE: Pearline Cables scale imaging, color Doppler and duplex ultrasound were performed of bilateral carotid and vertebral arteries in the neck. COMPARISON:  05/29/2015 FINDINGS: Criteria: Quantification of carotid stenosis is based on velocity parameters that correlate the residual internal carotid diameter with NASCET-based stenosis levels, using the diameter of the distal internal carotid lumen as the denominator for stenosis measurement. The following velocity measurements were obtained: RIGHT ICA:  Systolic 90 cm/sec, Diastolic 26 cm/sec CCA:  61 cm/sec SYSTOLIC ICA/CCA RATIO:  1.2 ECA:  107 cm/sec LEFT ICA:  Systolic 638 cm/sec, Diastolic 44 cm/sec CCA:  90 cm/sec SYSTOLIC ICA/CCA RATIO:  2.2 ECA:  150 cm/sec Right Brachial SBP: Not acquired Left Brachial SBP: Not acquired RIGHT CAROTID ARTERY: No significant calcified disease of the right common carotid artery. Intermediate waveform maintained. Heterogeneous plaque without significant calcifications at the right carotid bifurcation. Low resistance waveform of the right ICA. No significant tortuosity. RIGHT VERTEBRAL ARTERY: Antegrade flow with low resistance waveform. LEFT CAROTID ARTERY: No significant calcified disease of the left common carotid artery. Intermediate waveform maintained. Heterogeneous plaque at the left carotid bifurcation without significant calcifications. Low resistance waveform of the left ICA. LEFT VERTEBRAL ARTERY: Not visualized IMPRESSION: Right: Color duplex indicates moderate heterogeneous plaque, with no hemodynamically significant stenosis by duplex criteria in the extracranial cerebrovascular circulation. Left: Heterogeneous plaque at the left carotid bifurcation contributes to 50%-69% stenosis by established duplex criteria. Signed, Dulcy Fanny. Earleen Newport, DO [facility] Radiology Electronically Signed   By: Corrie Mckusick D.O.   On: 07/28/2017 12:17    Scheduled Meds: .  stroke: mapping our early stages of recovery book   Does not apply Once  . aspirin EC  81 mg Oral BID  . aspirin  300 mg Rectal Daily   Or  . aspirin  325 mg Oral Daily  . betaxolol  1 drop Both Eyes BH-q7a  . carvedilol  12.5 mg Oral BID WC  . dorzolamide  1 drop Left Eye BID  . fluticasone  1 spray Each Nare Daily  . fluticasone furoate-vilanterol  1 puff Inhalation Daily  . heparin  5,000 Units Subcutaneous Q8H  . insulin aspart  0-9 Units Subcutaneous TID WC  . insulin detemir  5 Units Subcutaneous QHS  . latanoprost  1 drop Both Eyes Daily  . loratadine  10 mg Oral Daily  . Netarsudil Dimesylate  1 drop Ophthalmic QHS  . omega-3 acid ethyl  esters  1 g Oral BID  . pantoprazole  40 mg Oral Daily  . pilocarpine  1 drop Left Eye QID  . tamsulosin  0.4 mg Oral Daily  . umeclidinium bromide  1 puff Inhalation Daily   Continuous Infusions: . sodium chloride 100 mL/hr (07/27/17 2200)  . ampicillin-sulbactam (UNASYN) IV     Time spent: 35 minutes (over 50% of time dedicated to discussed with patient and family face to face findings, images results, conditions and plan of care. Also coordinating treatment and discussing with other services)   Barton Dubois  [facility] Pager [phone]. If 7PM-7AM, please contact night-coverage at [email or web address], password [facility] 07/29/2017, 5:00 PM  LOS: 2 days

## 2017-07-29 NOTE — Anesthesia Postprocedure Evaluation (Signed)
Anesthesia Post Note  Patient: Anthony Murray  Procedure(s) Performed: TRANSESOPHAGEAL ECHOCARDIOGRAM (TEE) WITH PROPOFOL (N/A )  Patient location during evaluation: PACU Anesthesia Type: MAC Level of consciousness: awake and alert and oriented Pain management: pain level controlled Vital Signs Assessment: post-procedure vital signs reviewed and stable Respiratory status: spontaneous breathing Cardiovascular status: blood pressure returned to baseline and stable Postop Assessment: no apparent nausea or vomiting Anesthetic complications: no     Last Vitals:  Vitals:   07/29/17 0900 07/29/17 0905  BP: (!) 178/91 (!) 184/89  Pulse: 69   Resp: (!) 21 19  Temp: 36.9 C   SpO2: 93% 97%    Last Pain:  Vitals:   07/29/17 0900  TempSrc: Oral  PainSc: 7                  Brenna Friesenhahn

## 2017-07-29 NOTE — Progress Notes (Signed)
*  PRELIMINARY RESULTS* Echocardiogram Echocardiogram Transesophageal has been performed.  Samuel Germany 07/29/2017, 10:35 AM

## 2017-07-29 NOTE — Consult Note (Signed)
    [facility] has been requested to perform a transesophageal echocardiogram on [name] for CVA and likely endocarditis. After careful review of history and examination, the risks and benefits of transesophageal echocardiogram have been explained including risks of esophageal damage, perforation (1:10,000 risk), bleeding, pharyngeal hematoma as well as other potential complications associated with conscious sedation including aspiration, arrhythmia, respiratory failure and death. Alternatives to treatment were discussed, questions were answered. Patient is willing to proceed.   The patient is A&Ox3 at the time of this encounter (issues with AMS earlier this admission). His granddaughter is at the bedside and the procedure was reviewed in detail with her as well. I also called the patient's wife upon their request and updated her with his TTE results and request for TEE. The patient and his family are in agreement to proceed.   He has been NPO since midnight (order not placed until this morning but he did not consume breakfast). Hgb stable at 13.6 and platelets 130. BP has been stable with no requirement for pressor support. The TEE is tentatively scheduled for today at 0900.   Erma Heritage, PA-C  07/29/2017 7:55 AM    Attending note:  Patient seen and examined.  I personally reviewed the transthoracic echocardiogram which indicates probable mitral valve vegetation.  Transesophageal echocardiogram has been requested by Dr. Dyann Kief.  Informed consent obtained and patient is prepared to proceed.  Anesthesia service will be providing sedation.  Satira Sark, M.D., F.A.C.C.

## 2017-07-29 NOTE — Interval H&P Note (Signed)
History and Physical Interval Note:  07/29/2017 9:00 AM  Patient presents for TEE for clarification of mitral valve vegetation and suspected endocarditis.  Informed consent obtained, procedure also discussed with granddaughter.  He is in agreement to proceed.  Satira Sark, M.D., F.A.C.C.

## 2017-07-29 NOTE — Anesthesia Preprocedure Evaluation (Signed)
Anesthesia Evaluation  Patient identified by MRN, date of birth, ID band Patient awake    Reviewed: Allergy & Precautions, NPO status , Patient's Chart, lab work & pertinent test results, reviewed documented beta blocker date and time   Airway Mallampati: II  TM Distance: >3 FB     Dental  (+) Edentulous Upper, Edentulous Lower   Pulmonary COPD, former smoker,    breath sounds clear to auscultation       Cardiovascular hypertension, Pt. on medications and Pt. on home beta blockers + CAD and + Peripheral Vascular Disease  + Valvular Problems/Murmurs AS  Rhythm:Regular Rate:Normal     Neuro/Psych Admitted with acute encephalopathy CVA    GI/Hepatic negative GI ROS, Neg liver ROS,   Endo/Other  diabetes, Type 2, Oral Hypoglycemic Agents  Renal/GU Renal disease     Musculoskeletal   Abdominal   Peds  Hematology   Anesthesia Other Findings   Reproductive/Obstetrics                             Anesthesia Physical Anesthesia Plan  ASA: IV  Anesthesia Plan: MAC   Post-op Pain Management:    Induction: Intravenous  PONV Risk Score and Plan:   Airway Management Planned: Simple Face Mask  Additional Equipment:   Intra-op Plan:   Post-operative Plan:   Informed Consent: I have reviewed the patients History and Physical, chart, labs and discussed the procedure including the risks, benefits and alternatives for the proposed anesthesia with the patient or authorized representative who has indicated his/her understanding and acceptance.     Plan Discussed with:   Anesthesia Plan Comments:         Anesthesia Quick Evaluation

## 2017-07-29 NOTE — Progress Notes (Signed)
  Speech Language Pathology Treatment: Dysphagia  Patient Details Name: WAH SABIC MRN: [id number] DOB: 1927-07-08 Today's Date: 07/29/2017 Time: 1730-1740 SLP Time Calculation (min) (ACUTE ONLY): 10 min  Assessment / Plan / Recommendation Clinical Impression  Dysphagia treatment provided to check diet tolerance. Pt showed no overt s/s of aspiration with trials of regular solid/ thin liquids, although this was somewhat limited as pt had just finished dinner and did not wish to have much more PO intake. Pt able to feed self without difficulty. Daughter at bedside indicated that pt has not shown any s/s of aspiration during meal today. Continues to have slowed mastication of regular solid. Recommend continuing dysphagia 3 diet/ thin liquids, meds whole with liquid. SLP will sign off at this time; please re-consult if needs arise.  HPI HPI: 84 year old male with a past medical history significant for chronic kidney disease stage III, type 2 diabetes, hypertension, BPH, GERD, glaucoma and COPD; who presented to the emergency department secondary to fever, mechanical fall, changes in mentation and some headache.  Per family patient was doing great until the night prior to admission and then suddenly very early in the morning after he woke up he experienced to episodes back to back of falling with the last one hitting his head; he has been complaining of feeling and some upper respiratory infection symptoms with some ongoing congestion and a recent use of Mucinex DM to try to break up loose some phlegm. No CP, no hemoptysis, no complaints of dysuria, nausea, vomiting or any other complaints.       SLP Plan  All goals met       Recommendations  Diet recommendations: Dysphagia 3 (mechanical soft);Thin liquid Liquids provided via: Cup;Straw Medication Administration: Whole meds with liquid Supervision: Patient able to self feed;Intermittent supervision to cue for compensatory  strategies Compensations: Slow rate;Small sips/bites Postural Changes and/or Swallow Maneuvers: Seated upright 90 degrees                Oral Care Recommendations: Oral care BID Follow up Recommendations: None SLP Visit Diagnosis: Dysphagia, unspecified (R13.10) Plan: All goals met       GO                Kern Reap, MA, CCC-SLP 07/29/2017, 5:43 PM

## 2017-07-29 NOTE — H&P (View-Only) (Signed)
    [facility] has been requested to perform a transesophageal echocardiogram on Anthony Murray for CVA and likely endocarditis. After careful review of history and examination, the risks and benefits of transesophageal echocardiogram have been explained including risks of esophageal damage, perforation (1:10,000 risk), bleeding, pharyngeal hematoma as well as other potential complications associated with conscious sedation including aspiration, arrhythmia, respiratory failure and death. Alternatives to treatment were discussed, questions were answered. Patient is willing to proceed.   The patient is A&Ox3 at the time of this encounter (issues with AMS earlier this admission). His granddaughter is at the bedside and the procedure was reviewed in detail with her as well. I also called the patient's wife upon their request and updated her with his TTE results and request for TEE. The patient and his family are in agreement to proceed.   He has been NPO since midnight (order not placed until this morning but he did not consume breakfast). Hgb stable at 13.6 and platelets 130. BP has been stable with no requirement for pressor support. The TEE is tentatively scheduled for today at 0900.   Erma Heritage, PA-C  07/29/2017 7:55 AM    Attending note:  Patient seen and examined.  I personally reviewed the transthoracic echocardiogram which indicates probable mitral valve vegetation.  Transesophageal echocardiogram has been requested by Dr. Dyann Kief.  Informed consent obtained and patient is prepared to proceed.  Anesthesia service will be providing sedation.  Satira Sark, M.D., F.A.C.C.

## 2017-07-29 NOTE — Care Management Important Message (Signed)
Important Message  Patient Details  Name: Anthony Murray MRN: [id number] Date of Birth: 1928/06/02   Medicare Important Message Given:  Yes    Sherald Barge, RN 07/29/2017, 4:23 PM

## 2017-07-29 NOTE — Plan of Care (Signed)
  Progressing Activity: Risk for activity intolerance will decrease 07/29/2017 1402 - Progressing by Berton Bon, RN Coping: Level of anxiety will decrease 07/29/2017 1402 - Progressing by Berton Bon, RN Pain Managment: General experience of comfort will improve 07/29/2017 1402 - Progressing by Berton Bon, RN Safety: Ability to remain free from injury will improve 07/29/2017 1402 - Progressing by Berton Bon, RN Skin Integrity: Risk for impaired skin integrity will decrease 07/29/2017 1402 - Progressing by Berton Bon, RN Coping: Will identify appropriate support needs 07/29/2017 1402 - Progressing by Berton Bon, RN Nutrition: Risk of aspiration will decrease 07/29/2017 1402 - Progressing by Berton Bon, RN Dietary intake will improve 07/29/2017 1402 - Progressing by Berton Bon, RN

## 2017-07-29 NOTE — Progress Notes (Signed)
Unable to perform neuro checks at scheduled time due to patient transported to OR for TEE.

## 2017-07-29 NOTE — Progress Notes (Signed)
Pharmacy Antibiotic Note  Anthony Murray is a 84 y.o. male admitted on 07/27/2017 with endocarditis.  Pharmacy has been consulted for Cefazolin dosing. Possible mitral valve vegetation by transthoracic echocardiogram   Plan: Cefazolin 2gm IV q8h F/U cxs and clinical progress Monitor V/S and labs  Height: 5\' 6"  (167.6 cm) Weight: 189 lb (85.7 kg) IBW/kg (Calculated) : 63.8  Temp (24hrs), Avg:98.1 F (36.7 C), Min:97.5 F (36.4 C), Max:98.5 F (36.9 C)  Recent Labs  Lab 07/27/17 1028 07/27/17 1242 07/28/17 0401 07/29/17 0629  WBC 9.5  --  6.7  --   CREATININE 1.38*  --  1.41* 1.46*  LATICACIDVEN 1.3 1.4  --   --     Estimated Creatinine Clearance: 36.6 mL/min (A) (by C-G formula based on SCr of 1.46 mg/dL (H)).    Allergies  Allergen Reactions  . Codeine   . Ezetimibe-Simvastatin     REACTION: myalgias  . Naproxen     REACTION: and all nonsteroidals  . Niacin   . Pravastatin Sodium     REACTION: myalgias no    Antimicrobials this admission: Cefazolin 2/8>> Vanco 2/6 >> 2/7 Rocephin 2/6 >> 2/8 Acyclovir 2/6>>2/7  Dose adjustments this admission: N/A  Microbiology results: 2/6 BCx: ngtd 2/6 urine: multiple species, NTR 2/6 MRSA PCR is negative 2/6 CSF culture: ngtd   Thank you for allowing pharmacy to be a part of this patient's care.  Isac Sarna, BS [place], [place] Clinical Pharmacist Pager [phone]  07/29/2017 12:24 PM

## 2017-07-29 NOTE — Evaluation (Signed)
Occupational Therapy Evaluation Patient Details Name: Anthony Murray MRN: [id number] DOB: July 10, 1927 Today's Date: 07/29/2017    History of Present Illness Anthony Murray is a 84 year old male with a past medical history significant for chronic kidney disease stage III, type 2 diabetes, hypertension, BPH, GERD, glaucoma and COPD; who presented to the emergency department secondary to fever, mechanical fall, changes in mentation and some headache.  Per family patient was doing great until the night prior to admission and then suddenly very early in the morning after he woke up he experienced to episodes back to back of falling with the last one hitting his head; he has been complaining of feeling and some upper respiratory infection symptoms with some ongoing congestion and a recent use of Mucinex DM to try to break up loose some phlegm. No CP, no hemoptysis, no complaints of dysuria, nausea, vomiting or any other complaints. MRI positive for acute infarct   Clinical Impression   Pt received seated at EOB attempting to get up to go to restroom. Pt requiring min guard assist for functional mobility with consistent cuing for correct use of RW and for safety. Min guard for standing ADLs, pt easily fatigued and leaning over onto sink during grooming tasks and when reaching for paper towels. Pt demonstrates generalized weakness of BUE, strength grossly 4-/5; coordination and sensation appear intact. Pt would benefit from continued OT services on discharge to evaluate B/ADL completion in home setting. Recommend HHOT, no further acute OT services required at this time. Also recommend tub/shower seat for safety during bathing tasks at home.     Follow Up Recommendations  Home health OT;Supervision/Assistance - 24 hour    Equipment Recommendations  Tub/shower seat       Precautions / Restrictions Precautions Precautions: Fall      Mobility Bed Mobility               General bed mobility  comments: Pt seated at EOB on OT arrival  Transfers Overall transfer level: Needs assistance Equipment used: Rolling walker (2 wheeled);None Transfers: Sit to/from Stand Sit to Stand: Min assist         General transfer comment: unsteady without assistive device        ADL either performed or assessed with clinical judgement   ADL Overall ADL's : Needs assistance/impaired     Grooming: Wash/dry hands;Min guard;Standing                   Toilet Transfer: Minimal assistance;Regular Toilet;RW   Toileting- Water quality scientist and Hygiene: Min guard;Sit to/from stand       Functional mobility during ADLs: Min guard;Rolling walker       Vision Baseline Vision/History: No visual deficits Patient Visual Report: No change from baseline Vision Assessment?: No apparent visual deficits            Pertinent Vitals/Pain Pain Assessment: No/denies pain     Hand Dominance Right   Extremity/Trunk Assessment Upper Extremity Assessment Upper Extremity Assessment: Generalized weakness   Lower Extremity Assessment Lower Extremity Assessment: Defer to PT evaluation   Cervical / Trunk Assessment Cervical / Trunk Assessment: Normal   Communication Communication Communication: No difficulties   Cognition Arousal/Alertness: Awake/alert Behavior During Therapy: WFL for tasks assessed/performed Overall Cognitive Status: Within Functional Limits for tasks assessed  Home Living Family/patient expects to be discharged to:: Private residence Living Arrangements: Spouse/significant other Available Help at Discharge: Family;Available PRN/intermittently Type of Home: House Home Access: Level entry     Home Layout: One level     Bathroom Shower/Tub: Tub/shower unit;Walk-in shower   Bathroom Toilet: Standard     Home Equipment: Walker - standard;Cane - single point;Wheelchair - [employer];Hospital bed          Prior Functioning/Environment Level of Independence: Needs assistance  Gait / Transfers Assistance Needed: can ambulate short household distances without assistive device, uses standard walker PRN, uses wheelchair for longer distances ADL's / Homemaking Assistance Needed: assisted daily by family-level of assistance unknown            OT Problem List: Decreased strength;Decreased activity tolerance;Impaired balance (sitting and/or standing);Decreased safety awareness;Decreased knowledge of use of DME or AE          End of Session Equipment Utilized During Treatment: Gait belt;Rolling walker  Activity Tolerance: Patient tolerated treatment well Patient left: in chair;with call bell/phone within reach;with chair alarm set  OT Visit Diagnosis: Muscle weakness (generalized) (M62.81)                Time: 0725-0800 OT Time Calculation (min): 35 min Charges:  OT General Charges $OT Visit: 1 Visit OT Evaluation $OT Eval Low Complexity: [place], OTR/L  [phone] 07/29/2017, 8:05 AM

## 2017-07-29 NOTE — CV Procedure (Signed)
Transthoracic echocardiogram  Indication: Possible mitral valve vegetation by transthoracic echocardiogram in the setting of sepsis and stroke  Description of procedure: After informed consent was obtained patient was taken to the procedure suite where a timeout was performed.  He was prepped in usual fashion and viscous lidocaine was utilized to anesthetize the oropharynx.  Moderate to deep sedation was provided by the anesthesia service with use of propofol.  Bite block was placed.  A multiplane transesophageal echocardiographic probe was inserted in the esophagus and multiple images obtained.  Patient tolerated the procedure well without immediate complications.  Findings are as follows:  Study Conclusions  - Left ventricle: Systolic function was normal. The estimated   ejection fraction was in the range of 55% to 60%. Wall motion was   normal; there were no regional wall motion abnormalities. - Aortic valve: Small, freely mobile echodensity seen   intermittently on the ventricular side of the right coronary   aortic cusp suggestive of vegetation given clinical scenario.   There was moderate stenosis. Valve area approximately 1.4 cm2 by   planimetry. There was mild regurgitation. - Aorta: Moderate to severe diffuse atherosclerosis present. - Mitral valve: Very small, freely mobile echodensity on the atrial   side of the posterior mitral leaflet suggestive of vegetation   given clinical scenario. The larger abnormality in region of   anterior leaflet noted on the transthoracic study is not present   - perhaps more reflective of artifact. Mildly calcified annulus.   There was mild regurgitation. - Left atrium: The atrium was dilated. No evidence of thrombus in   the atrial cavity or appendage. Emptying velocity was normal. - Right atrium: No evidence of thrombus in the atrial cavity or   appendage. - Atrial septum: No significant defect or patent foramen ovale was   identified by color  Doppler or agitated saline injection. - Tricuspid valve: There was trivial regurgitation. - Pulmonic valve: There was mild regurgitation. - Pericardium, extracardiac: There was no pericardial effusion.  Satira Sark, M.D., F.A.C.C.

## 2017-07-29 NOTE — Transfer of Care (Signed)
Immediate Anesthesia Transfer of Care Note  Patient: Anthony Murray  Procedure(s) Performed: TRANSESOPHAGEAL ECHOCARDIOGRAM (TEE) WITH PROPOFOL (N/A )  Patient Location: PACU  Anesthesia Type:MAC  Level of Consciousness: awake, alert  and oriented  Airway & Oxygen Therapy: Patient Spontanous Breathing and Patient connected to nasal cannula oxygen  Post-op Assessment: Report given to RN  Post vital signs: Reviewed and stable  Last Vitals:  Vitals:   07/29/17 0900 07/29/17 0905  BP: (!) 178/91 (!) 184/89  Pulse: 69   Resp: (!) 21 19  Temp: 36.9 C   SpO2: 93% 97%    Last Pain:  Vitals:   07/29/17 0900  TempSrc: Oral  PainSc: 7       Patients Stated Pain Goal: 8 (47/65/46 5035)  Complications: No apparent anesthesia complications

## 2017-07-29 NOTE — Progress Notes (Signed)
Physical Therapy Treatment Patient Details Name: Anthony Murray MRN: [id number] DOB: September 16, 1927 Today's Date: 07/29/2017    History of Present Illness Anthony Murray is a 84 year old male with a past medical history significant for chronic kidney disease stage III, type 2 diabetes, hypertension, BPH, GERD, glaucoma and COPD; who presented to the emergency department secondary to fever, mechanical fall, changes in mentation and some headache.  Per family patient was doing great until the night prior to admission and then suddenly very early in the morning after he woke up he experienced to episodes back to back of falling with the last one hitting his head; he has been complaining of feeling and some upper respiratory infection symptoms with some ongoing congestion and a recent use of Mucinex DM to try to break up loose some phlegm. No CP, no hemoptysis, no complaints of dysuria, nausea, vomiting or any other complaints.     PT Comments    Pt received lying in bed with family at bedside and was agreeable to PT treatment. Pt continues to demo generalized weakness, unsteadiness during gait, and impulsivity/improper use of RW during ambulation. Pt able to ambulate 17ft with RW this date but required max verbal cues and assistance to keep RW close to him. Pt able to perform sit <> stands with min guard today. Pt left with nursing and daughter at bedside. Continue to recommend venue below to further promote strength, balance, gait, and decrease risk for falls. Will continue to follow acutely.   Follow Up Recommendations  Home health PT;Supervision for mobility/OOB     Equipment Recommendations  Rolling walker with 5" wheels    Recommendations for Other Services       Precautions / Restrictions Precautions Precautions: Fall Restrictions Weight Bearing Restrictions: Yes LLE Weight Bearing: Weight bearing as tolerated    Mobility  Bed Mobility Overal bed mobility: Needs Assistance Bed  Mobility: Supine to Sit     Supine to sit: Min assist     General bed mobility comments: min A for upper body  Transfers Overall transfer level: Needs assistance Equipment used: Rolling walker (2 wheeled) Transfers: Sit to/from [facility] Sit to Stand: Min guard Stand pivot transfers: Min assist       General transfer comment: impulsive even with RW; let RW get way out in front of him and required assistance to keep it close  Ambulation/Gait Ambulation/Gait assistance: Min assist Ambulation Distance (Feet): 30 Feet Assistive device: Rolling walker (2 wheeled) Gait Pattern/deviations: Decreased step length - right;Decreased step length - left;Decreased stride length;Step-to pattern;Step-through pattern   Gait velocity interpretation: Below normal speed for age/gender General Gait Details: demonstrates slow labored cadence, fatigues easily when using RW as a pick-up walker, unsteady/unsafe to ambulate without assistive device; continually lets RW get way ahead of him, cues to keep it close and he says "I can't walk like that." Cued pt to take longer steps with RLE   Stairs            Wheelchair Mobility    Modified Rankin (Stroke Patients Only)       Balance Overall balance assessment: Needs assistance Sitting-balance support: Feet supported;No upper extremity supported Sitting balance-Leahy Scale: Fair Sitting balance - Comments: increased lean R posterolateral during sitting this date Postural control: Posterior lean;Right lateral lean Standing balance support: During functional activity;Bilateral upper extremity supported Standing balance-Leahy Scale: Fair Standing balance comment: fair/poor without an assistive device  Cognition Arousal/Alertness: Awake/alert Behavior During Therapy: WFL for tasks assessed/performed Overall Cognitive Status: Within Functional Limits for tasks assessed                                         Exercises Total Joint Exercises Ankle Circles/Pumps: Both;10 reps;Seated Long Arc Quad: Both;10 reps;Seated Marching in Standing: Both;10 reps;Seated;Limitations Marching in Standing Limitations: (performed seated marching)    General Comments        Pertinent Vitals/Pain Pain Assessment: No/denies pain    Home Living                      Prior Function            PT Goals (current goals can now be found in the care plan section) Acute Rehab PT Goals Patient Stated Goal: return home with family to assist PT Goal Formulation: With patient/family Time For Goal Achievement: 08/04/17 Potential to Achieve Goals: Good    Frequency    7X/week      PT Plan      Co-evaluation              AM-PAC PT "6 Clicks" Daily Activity  Outcome Measure  Difficulty turning over in bed (including adjusting bedclothes, sheets and blankets)?: A Little Difficulty moving from lying on back to sitting on the side of the bed? : A Little Difficulty sitting down on and standing up from a chair with arms (e.g., wheelchair, bedside commode, etc,.)?: A Little Help needed moving to and from a bed to chair (including a wheelchair)?: A Little Help needed walking in hospital room?: A Little Help needed climbing 3-5 steps with a railing? : A Lot 6 Click Score: 17    End of Session   Activity Tolerance: Patient tolerated treatment well;Patient limited by fatigue Patient left: in chair;with call bell/phone within reach;with family/visitor present;with nursing/sitter in room Nurse Communication: Mobility status PT Visit Diagnosis: Unsteadiness on feet (R26.81);Other abnormalities of gait and mobility (R26.89);Muscle weakness (generalized) (M62.81)     Time: 6237-6283 PT Time Calculation (min) (ACUTE ONLY): 36 min  Charges:  $Therapeutic Activity: 23-37 mins                    G Codes:         Geraldine Solar PT, DPT

## 2017-07-29 NOTE — Progress Notes (Signed)
Pt combative and has made frequent attempts to get out of the bed. Pt states that he is "leaving to go see his wife" Attempts to reorient patient were unsuccessful. Charge nurse and MD informed. Received order for Haloperidol 1 mg every 8 hours PRN agitation. Will continue to monitor patient.

## 2017-07-30 LAB — GLUCOSE, CAPILLARY
GLUCOSE-CAPILLARY: 117 mg/dL — AB (ref 65–99)
GLUCOSE-CAPILLARY: 141 mg/dL — AB (ref 65–99)
GLUCOSE-CAPILLARY: 148 mg/dL — AB (ref 65–99)
GLUCOSE-CAPILLARY: 150 mg/dL — AB (ref 65–99)

## 2017-07-30 MED ORDER — ASPIRIN EC 81 MG PO TBEC
81.0000 mg | DELAYED_RELEASE_TABLET | Freq: Every day | ORAL | Status: DC
  Administered 2017-07-30 – 2017-08-01 (×3): 81 mg via ORAL
  Filled 2017-07-30 (×3): qty 1

## 2017-07-30 MED ORDER — DEXTROSE 5 % IV SOLN
2.0000 g | INTRAVENOUS | Status: DC
  Administered 2017-07-30 – 2017-07-31 (×2): 2 g via INTRAVENOUS
  Filled 2017-07-30 (×3): qty 20

## 2017-07-30 MED ORDER — SODIUM CHLORIDE 0.9 % IV SOLN
1250.0000 mg | INTRAVENOUS | Status: DC
  Administered 2017-07-31 – 2017-08-01 (×2): 1250 mg via INTRAVENOUS
  Filled 2017-07-30 (×3): qty 1250

## 2017-07-30 MED ORDER — VANCOMYCIN HCL 10 G IV SOLR
1500.0000 mg | Freq: Once | INTRAVENOUS | Status: AC
  Administered 2017-07-30: 1500 mg via INTRAVENOUS
  Filled 2017-07-30: qty 1500

## 2017-07-30 MED ORDER — HALOPERIDOL LACTATE 5 MG/ML IJ SOLN: 0.5000 mg | Freq: Three times a day (TID) | INTRAMUSCULAR | Status: DC | PRN

## 2017-07-30 NOTE — Progress Notes (Addendum)
Pharmacy Antibiotic Note  Anthony Murray is a 84 y.o. male admitted on 07/27/2017 with negative culture endocarditis.  Pharmacy has been consulted for Vancomycin dosing. Possible mitral valve and aortic vegetation by transthoracic echocardiogram   Plan: Vancomycin 1500mg  loading dose, then 1250mg  IV q24h . Goal trough 15-46mcg/ml Also, added Rocephin 2g IV q24h F/U cxs and clinical progress Monitor V/S and labs and trough as needed  Height: 5\' 6"  (167.6 cm) Weight: 190 lb 14.7 oz (86.6 kg) IBW/kg (Calculated) : 63.8  Temp (24hrs), Avg:98.5 F (36.9 C), Min:98.3 F (36.8 C), Max:99 F (37.2 C)  Recent Labs  Lab 07/27/17 1028 07/27/17 1242 07/28/17 0401 07/29/17 0629  WBC 9.5  --  6.7  --   CREATININE 1.38*  --  1.41* 1.46*  LATICACIDVEN 1.3 1.4  --   --     Estimated Creatinine Clearance: 36.8 mL/min (A) (by C-G formula based on SCr of 1.46 mg/dL (H)).    Allergies  Allergen Reactions  . Codeine   . Ezetimibe-Simvastatin     REACTION: myalgias  . Naproxen     REACTION: and all nonsteroidals  . Niacin   . Pravastatin Sodium     REACTION: myalgias no    Antimicrobials this admission: Cefazolin 2/8>>2/9 Vanco 2/6 >> 2/7  Restarted 2/9>> Rocephin 2/6 >> 2/8  Restarted 2/9>> Acyclovir 2/6>>2/7  Dose adjustments this admission: N/A  Microbiology results: 2/6 BCx: ngtd 2/6 urine: multiple species, NTR 2/6 MRSA PCR is negative 2/6 CSF culture: ngtd   Thank you for allowing pharmacy to be a part of this patient's care.  Isac Sarna, BS Pharm D, BCPS Clinical Pharmacist Pager [phone]  07/30/2017 3:00 PM

## 2017-07-30 NOTE — Progress Notes (Signed)
Physical Therapy Treatment Patient Details Name: Anthony Murray MRN: [id number] DOB: Sep 12, 1927 Today's Date: 07/30/2017    History of Present Illness Anthony Murray is a 84 year old male with a past medical history significant for chronic kidney disease stage III, type 2 diabetes, hypertension, BPH, GERD, glaucoma and COPD; who presented to the emergency department secondary to fever, mechanical fall, changes in mentation and some headache.  Per family patient was doing great until the night prior to admission and then suddenly very early in the morning after he woke up he experienced to episodes back to back of falling with the last one hitting his head; he has been complaining of feeling and some upper respiratory infection symptoms with some ongoing congestion and a recent use of Mucinex DM to try to break up loose some phlegm. No CP, no hemoptysis, no complaints of dysuria, nausea, vomiting or any other complaints.     PT Comments    Pt has just gotten back from a procedure with anesthesia and is very sleepy.  He is willing to  Complete exercises but we will not ambulate due to pt lethargic state.  Follow Up Recommendations  Home health PT;Supervision for mobility/OOB     Equipment Recommendations  Rolling walker with 5" wheels    Recommendations for Other Services       Precautions / Restrictions Precautions Precautions: Fall Restrictions Weight Bearing Restrictions: Yes    Mobility  Bed Mobility                  Transfers                    Ambulation/Gait                 Stairs            Wheelchair Mobility    Modified Rankin (Stroke Patients Only)          Cognition Arousal/Alertness: Lethargic(just back from procedure w/anesthesia ) Behavior During Therapy: WFL for tasks assessed/performed Overall Cognitive Status: Within Functional Limits for tasks assessed                                        Exercises  General Exercises - Lower Extremity Ankle Circles/Pumps: Both;10 reps Quad Sets: Both;10 reps Gluteal Sets: (bridging x 10) Heel Slides: Both;5 reps Hip ABduction/ADduction: Both(isometric x 10 ) Straight Leg Raises: Both;10 reps        Pertinent Vitals/Pain Pain Assessment: No/denies pain           PT Goals (current goals can now be found in the care plan section) Acute Rehab PT Goals Patient Stated Goal: return home with family to assist PT Goal Formulation: With patient/family Time For Goal Achievement: 08/04/17 Potential to Achieve Goals: Good Progress towards PT goals: Not progressing toward goals - comment(PT to lethargic from procedure to ambultate.)    Frequency    7X/week      PT Plan  Continue with strengthening exercises and gait training to improve safety.           End of Session   Activity Tolerance: Patient limited by lethargy Patient left: with call bell/phone within reach;in bed Nurse Communication: Mobility status PT Visit Diagnosis: Unsteadiness on feet (R26.81);Other abnormalities of gait and mobility (R26.89);Muscle weakness (generalized) (M62.81)     Time: 1510-1530 PT Time Calculation (min) (ACUTE ONLY): 20 min  Charges:  $Therapeutic Exercise: 8-22 mins                        Rayetta Humphrey, [place] CLT [phone] 07/30/2017, 3:32 PM

## 2017-07-30 NOTE — Addendum Note (Signed)
Addendum  created 07/30/17 0746 by Charmaine Downs, CRNA   Sign clinical note

## 2017-07-30 NOTE — Progress Notes (Signed)
[facility] PROGRESS NOTE  Anthony Murray MRN:[id number] DOB: 02/04/1928 DOA: 07/27/2017 PCP: [place], Kofi, MD  Interim summary and HPI 84-year-old male with a past medical history significant for chronic kidney disease stage III, type 2 diabetes, hypertension, BPH, GERD, glaucoma and COPD; who presented to the emergency department secondary to fever, mechanical fall, changes in mentation and some headache.  Met SIRS/sepsis criteria on admission; work up demonstrated septic emboli and positive endocarditis and aortic vegetation.    Assessment/Plan: 1-Acute encephalopathy: etiology unclear, but most likely associated with acute septic emboli from endocarditis. -MRI motion degraded but suggesting acute/subacute lesions in cortical/white matter -appreciate assistance from neurology service -continue treatment with antibiotics for endocarditis (see below) -HIV and RPR non reactive, B12 and TSH WNL -patient w/o neck rigidity and currently without fever  -follow clinical response  -following ID rec's, will start treatment with vancomycin and rocephin and will treat for 4 weeks.   2-Sepsis: due to endocarditis -patient with fever, tachycardia and mild elevation on RR on admission. -positive endocarditis and vegetation appreciated on 2-D echo and TEE -most likely autonomic response from acute stroke  -follow final blood cx's results (so far 72 hours, no growth and patient afebrile) -following ID rec's for neg culture endocarditis will use vancomycin and rocephin and treat for 4 weeks. -follow PICC placement  3-HLD (hyperlipidemia) -LDL 108 -with intolerance to statins -continue lovaza -repeat lipid panel in 6 weeks   4-Essential hypertension -BP has remained stable. -continue current antihypertensive regimen. -heart healthy diet.  5-prior hx of CEREBROVASCULAR DISEASE: now with new 2 different focal lesions affecting cortical and white matter. -ASA 81mg daily -appreciate  neurology inputs and rec's -Presentation compatible with septic emboli due to endocarditis. -Antibiotics as recommended by ID.   6-Type 2 diabetes mellitus with complications (HCC) -continue lantus and SSI -Continue modified carbohydrate diet  7-CKD (chronic kidney disease), stage III (HCC) -Creatinine has remained at 1.4 range (which is baseline) -Will follow renal function trend. -pharmacy to assist with medication dose adjustment base on renal function    8-Glaucoma -denying eye pain currently -Continue home eyedrop regimen. -per family, patient to have eye surgery next week; which will need to be rescheduled.  9-BPH -Stable and unchanged -Continue Flomax.   -No signs of acute urinary retention symptoms   10-sinusitis -continue flonase and loratadine -Will be on vancomycin and Rocephin now (good coverage anyway for potential underlying sinus infection)  Code Status: Full Family Communication: granddaughter at bedside  Disposition Plan: Remains inpatient.  Patient S/P TEE confirming endocarditis/aortic vegetation. Will place PICC; initiate treatment with vancomycin and rocephin (plan for 4 weeks)   Consultants:  Neurology  Cardiology  ID (Dr. Campbell)  Procedures:  See below for x-ray reports   Echo: with positive aortic vegetation  Carotid duplex: Right: Color duplex indicates moderate heterogeneous plaque, with no hemodynamically significant stenosis by duplex criteria.  Left: Heterogeneous plaque at the left carotid bifurcation contributes to 50%-69% stenosis   TEE: Positive aortic vegetation, preserved ejection fraction, no wall motion abnormalities.  Antibiotics:  Vancomycin and valtrex 07/27/17>>>07/28/17; restarted 2/9 (planning 4 weeks of antibiotics)  Rocephin 07/27/17>>>07/29/17; restarted 2/9 (planning 4 weeks of antibiotics)  Unasyn 2/8 >>2/9  HPI/Subjective: No fever, no chest pain, no shortness of breath.  Currently oriented x3, in no acute  distress and able to follow commands and answer questions appropriately.  Objective: Vitals:   07/30/17 0331 07/30/17 1053  BP: (!) 153/59 (!) 159/98  Pulse: 73 75  Resp: 18 18  Temp:   98.4 F (36.9 C) 98.3 F (36.8 C)  SpO2: 97% 96%    Intake/Output Summary (Last 24 hours) at 07/30/2017 1459 Last data filed at 07/30/2017 0700 Gross per 24 hour  Intake 2600 ml  Output 950 ml  Net 1650 ml   Filed Weights   07/29/17 0300 07/29/17 0900 07/30/17 0331  Weight: 86 kg (189 lb 9.5 oz) 85.7 kg (189 lb) 86.6 kg (190 lb 14.7 oz)    Exam:   General: Afebrile, oriented x3, no chest pain, no shortness of breath.  Patient with agitation and confusion throughout the night according to nursing staff reports.  No nausea, no vomiting and tolerating diet without problem.  Cardiovascular: S1 and S2, no rubs, no gallops, no appreciated murmur on exam.  No JVD.  Respiratory: Good air movement bilaterally, currently without any wheezing or crackles on exam.  Abdomen: Soft, nontender, nondistended, positive bowel sounds.    Musculoskeletal: No edema, no cyanosis, no clubbing.    Neurology: No focal motor deficit appreciated.  Data Reviewed: Basic Metabolic Panel: Recent Labs  Lab 07/27/17 1028 07/28/17 0401 07/29/17 0629  NA 139 138  --   K 3.7 3.3*  --   CL 103 106  --   CO2 25 24  --   GLUCOSE 151* 91  --   BUN 19 17  --   CREATININE 1.38* 1.41* 1.46*  CALCIUM 9.0 8.0*  --    CBC: Recent Labs  Lab 07/27/17 1028 07/28/17 0401  WBC 9.5 6.7  NEUTROABS 7.2  --   HGB 15.9 13.6  HCT 49.5 42.9  MCV 88.6 89.9  PLT 158 130*   CBG: Recent Labs  Lab 07/29/17 1140 07/29/17 1605 07/29/17 2234 07/30/17 0743 07/30/17 1126  GLUCAP 135* 128* 126* 117* 141*    Recent Results (from the past 240 hour(s))  Culture, Urine     Status: Abnormal   Collection Time: 07/27/17  7:20 AM  Result Value Ref Range Status   Specimen Description   Final    URINE, RANDOM Performed at [facility], [address], [place], [place] [address]    Special Requests   Final    NONE Performed at [facility], [address], [place], [place] [address]    [place], SUGGEST RECOLLECTION (A)  Final   Report Status 07/29/2017 FINAL  Final  Culture, blood (Routine X 2) w Reflex to ID Panel     Status: None (Preliminary result)   Collection Time: 07/27/17 12:42 PM  Result Value Ref Range Status   Specimen Description BLOOD LEFT FOREARM  Final   Special Requests   Final    BOTTLES DRAWN AEROBIC AND ANAEROBIC Blood Culture adequate volume   Culture   Final    NO GROWTH 3 DAYS Performed at [facility], [address], [place], [place] [address]    Report Status PENDING  Incomplete  Culture, blood (Routine X 2) w Reflex to ID Panel     Status: None (Preliminary result)   Collection Time: 07/27/17 12:43 PM  Result Value Ref Range Status   Specimen Description BLOOD LEFT WRIST  Final   Special Requests   Final    BOTTLES DRAWN AEROBIC ONLY Blood Culture adequate volume   Culture   Final    NO GROWTH 3 DAYS Performed at [facility], [address], [place], [place] [address]    Report Status PENDING  Incomplete  CSF culture     Status: None (Preliminary result)  Collection Time: 07/27/17  3:12 PM  Result Value Ref Range Status   Specimen Description   Final    CSF Performed at [facility], [address] [place], [place] [address]    Special Requests   Final    NONE Performed at [facility], [address] [place], [place] [address]    Gram Stain   Final    NO ORGANISMS SEEN CYTOSPIN SMEAR Performed at [facility] NO WBC SEEN Performed at [facility], [address] [place], [place] [address]    Culture   Final    NO GROWTH 2 DAYS Performed at [facility], [address] [place], [place] [address]    Report Status PENDING  Incomplete  MRSA PCR Screening     Status: None   Collection Time: 07/27/17 11:36 PM  Result Value Ref Range  Status   MRSA by PCR NEGATIVE NEGATIVE Final    Comment:        The GeneXpert MRSA Assay (FDA approved for NASAL specimens only), is one component of a comprehensive MRSA colonization surveillance program. It is not intended to diagnose MRSA infection nor to guide or monitor treatment for MRSA infections. Performed at [facility], [address] , [place] [address]      Studies: No results found.  Scheduled Meds: .  stroke: mapping our early stages of recovery book   Does not apply Once  . aspirin EC  81 mg Oral Daily  . betaxolol  1 drop Both Eyes BH-q7a  . carvedilol  12.5 mg Oral BID WC  . dorzolamide  1 drop Left Eye BID  . fluticasone  1 spray Each Nare Daily  . fluticasone furoate-vilanterol  1 puff Inhalation Daily  . heparin  5,000 Units Subcutaneous Q8H  . insulin aspart  0-9 Units Subcutaneous TID WC  . insulin detemir  5 Units Subcutaneous QHS  . latanoprost  1 drop Both Eyes Daily  . loratadine  10 mg Oral Daily  . Netarsudil Dimesylate  1 drop Ophthalmic QHS  . omega-3 acid ethyl esters  1 g Oral BID  . pantoprazole  40 mg Oral Daily  . pilocarpine  1 drop Left Eye QID  . tamsulosin  0.4 mg Oral Daily  . umeclidinium bromide  1 puff Inhalation Daily   Continuous Infusions: . sodium chloride 100 mL/hr at 07/30/17 0647  . cefTRIAXone (ROCEPHIN)  IV     Time spent: 30 minutes (over 50% of time dedicated to discussed with patient and family face to face findings, images results, conditions and plan of care. Also coordinating treatment and discussing with other services)      [facility] Pager [phone]. If 7PM-7AM, please contact night-coverage at [email or web address], password [email or web address] 07/30/2017, 2:59 PM  LOS: 3 days             

## 2017-07-30 NOTE — Progress Notes (Signed)
Overheard patient yelling at daughter. Upon entering patient's room pt was observed perched at the end of the bed very precariously. Pt appeared confused and attempts at reorientation was unsuccessful. I attempted to reposition patient in the bed but pt refused to be repositioned. I explained to the patient about his risk for falling because of his gait instability and pt stated that it would not be the first time he has fallen. I then explained to patient that my main priority was his safety. Pt became extremely agitated and security, charge nurse,  RN4  was contacted for assistance with patient. Security did not place any hands on the patient. RN4 was able to descalate patient's behavior and he got himself back to bed. No restrictive measures have been utilized on patient. MD has been notified. Pt's daughter was in room at time of incident. Will continue to monitor patient.

## 2017-07-30 NOTE — Anesthesia Postprocedure Evaluation (Signed)
Anesthesia Post Note  Patient: [place] F[place]  Procedure(s) Performed: TRANSESOPHAGEAL ECHOCARDIOGRAM (TEE) WITH PROPOFOL (N/A )  Patient location during evaluation: Nursing Unit Anesthesia Type: MAC Level of consciousness: awake and patient cooperative Pain management: pain level controlled Vital Signs Assessment: post-procedure vital signs reviewed and stable Respiratory status: spontaneous breathing, nonlabored ventilation and respiratory function stable Cardiovascular status: blood pressure returned to baseline Postop Assessment: adequate PO intake Anesthetic complications: no     Last Vitals:  Vitals:   07/29/17 2331 07/30/17 0331  BP: (!) 168/84 (!) 153/59  Pulse: 71 73  Resp: 18 18  Temp: 36.8 C 36.9 C  SpO2: 96% 97%    Last Pain:  Vitals:   07/30/17 0331  TempSrc: Oral  PainSc:                  Nandika Stetzer J

## 2017-07-31 LAB — CBC
HEMATOCRIT: 42.5 % (ref 39.0–52.0)
HEMOGLOBIN: 13.1 g/dL (ref 13.0–17.0)
MCH: 27.9 pg (ref 26.0–34.0)
MCHC: 30.8 g/dL (ref 30.0–36.0)
MCV: 90.4 fL (ref 78.0–100.0)
Platelets: 140 10*3/uL — ABNORMAL LOW (ref 150–400)
RBC: 4.7 MIL/uL (ref 4.22–5.81)
RDW: 16.3 % — ABNORMAL HIGH (ref 11.5–15.5)
WBC: 5 10*3/uL (ref 4.0–10.5)

## 2017-07-31 LAB — BASIC METABOLIC PANEL
ANION GAP: 8 (ref 5–15)
BUN: 19 mg/dL (ref 6–20)
CHLORIDE: 110 mmol/L (ref 101–111)
CO2: 24 mmol/L (ref 22–32)
Calcium: 8.2 mg/dL — ABNORMAL LOW (ref 8.9–10.3)
Creatinine, Ser: 1.33 mg/dL — ABNORMAL HIGH (ref 0.61–1.24)
GFR calc non Af Amer: 46 mL/min — ABNORMAL LOW (ref >60)
GFR, EST AFRICAN AMERICAN: 54 mL/min — AB (ref >60)
GLUCOSE: 120 mg/dL — AB (ref 65–99)
POTASSIUM: 3.7 mmol/L (ref 3.5–5.1)
Sodium: 142 mmol/L (ref 135–145)

## 2017-07-31 LAB — CSF CULTURE: Culture: NO GROWTH

## 2017-07-31 LAB — CSF CULTURE W GRAM STAIN: Gram Stain: NONE SEEN

## 2017-07-31 LAB — GLUCOSE, CAPILLARY
GLUCOSE-CAPILLARY: 147 mg/dL — AB (ref 65–99)
GLUCOSE-CAPILLARY: 139 mg/dL — AB (ref 65–99)
Glucose-Capillary: 118 mg/dL — ABNORMAL HIGH (ref 65–99)
Glucose-Capillary: 105 mg/dL — ABNORMAL HIGH (ref 65–99)

## 2017-07-31 MED ORDER — HYDRALAZINE HCL 20 MG/ML IJ SOLN
5.0000 mg | Freq: Once | INTRAMUSCULAR | Status: AC
  Administered 2017-07-31: 5 mg via INTRAVENOUS
  Filled 2017-07-31: qty 1

## 2017-07-31 NOTE — Progress Notes (Signed)
[facility] PROGRESS NOTE  Anthony Murray WVP:[id number] DOB: 05/21/1928 DOA: 07/27/2017 PCP: Phillips Odor, MD  Interim summary and HPI 84 year old male with a past medical history significant for chronic kidney disease stage III, type 2 diabetes, hypertension, BPH, GERD, glaucoma and COPD; who presented to the emergency department secondary to fever, mechanical fall, changes in mentation and some headache.  Met SIRS/sepsis criteria on admission; work up demonstrated septic emboli and positive endocarditis and aortic vegetation.    Assessment/Plan: 1-Acute encephalopathy: etiology unclear, but most likely associated with acute septic emboli from endocarditis. -MRI motion degraded but suggesting acute/subacute lesions in cortical/white matter -appreciate assistance from neurology service -continue treatment with antibiotics for endocarditis (see below) -HIV and RPR non reactive, B12 and TSH WNL -patient w/o neck rigidity and currently without fever/non toxic -follow clinical response  -following ID rec's, will continue vancomycin and rocephin and will treat for 4 weeks.  -pharmacy to create OPAT  2-Sepsis: due to endocarditis -patient with fever, tachycardia and mild elevation on RR on admission. -positive endocarditis and vegetation appreciated on 2-D echo and TEE -most likely autonomic response from acute stroke  -follow final blood cx's results (so far 72 hours, no growth and patient afebrile) -following ID rec's for neg culture endocarditis will use vancomycin and rocephin and treat for 4 weeks. -follow PICC placement; schedule for placement today   3-HLD (hyperlipidemia) -LDL 108 -with intolerance to statins -continue lovaza -recommending repeating lipid panel in 6 weeks   4-Essential hypertension -BP has remained stable. -continue current antihypertensive regimen. -heart healthy diet. -PRN hydralazine -stop IVF's  5-prior hx of CEREBROVASCULAR DISEASE: now with  new 2 different focal lesions affecting cortical and white matter. -ASA 76m daily -appreciate neurology inputs and rec's -Presentation compatible with septic emboli due to endocarditis. -Antibiotics as recommended by ID.   6-Type 2 diabetes mellitus with complications (HCC) -continue lantus and SSI -Continue modified carbohydrate diet  7-CKD (chronic kidney disease), stage III (HCC) -Creatinine has remained stable -currently 1.33 -Will follow renal function trend. -pharmacy to assist with medication dose adjustment base on renal function    8-Glaucoma -denying eye pain currently -Continue home eyedrop regimen. -per family, patient to have eye surgery next week; which will need to be rescheduled.  9-BPH -Stable and unchanged -Continue Flomax.   -No signs of acute urinary retention symptoms   10-sinusitis -continue flonase and loratadine -Will be on vancomycin and Rocephin now (good coverage anyway for potential underlying sinus infection)  11-physical deconditioning -[facility] -will place order, CM to arrange  Code Status: Full Family Communication: granddaughter at bedside  Disposition Plan: Remains inpatient.  Patient S/P TEE confirming endocarditis/aortic vegetation. Waiting on PICC placement; Pharmacy to set up OPAT with vancomycin and rocephin (plan for 4 weeks)   Consultants:  Neurology  Cardiology  ID (Dr. CMegan Salon  Procedures:  See below for x-ray reports   Echo: with positive aortic vegetation  Carotid duplex: Right: Color duplex indicates moderate heterogeneous plaque, with no hemodynamically significant stenosis by duplex criteria.  Left: Heterogeneous plaque at the left carotid bifurcation contributes to 50%-69% stenosis   TEE: Positive aortic vegetation, preserved ejection fraction, no wall motion abnormalities.  Antibiotics:  Vancomycin and valtrex 07/27/17>>>07/28/17; restarted 2/9 (planning 4 weeks of  antibiotics)  Rocephin 07/27/17>>>07/29/17; restarted 2/9 (planning 4 weeks of antibiotics)  Unasyn 2/8 >>2/9  HPI/Subjective: No fever, no chest pain, no shortness of breath.  Has remained alert, awake and oriented x3.  Denies acute complaints.  Waiting for PICC line to be placed.  Objective: Vitals:   07/31/17 1323 07/31/17 1620  BP: (!) 158/76 (!) 181/78  Pulse: 72 71  Resp: 18 18  Temp: (!) 97.3 F (36.3 C) 98.1 F (36.7 C)  SpO2: 96% 98%    Intake/Output Summary (Last 24 hours) at 07/31/2017 1814 Last data filed at 07/31/2017 1200 Gross per 24 hour  Intake 1476.67 ml  Output 700 ml  Net 776.67 ml   Filed Weights   07/29/17 0900 07/30/17 0331 07/31/17 0523  Weight: 85.7 kg (189 lb) 86.6 kg (190 lb 14.7 oz) 86.3 kg (190 lb 4.1 oz)    Exam:   General: No fever, nontoxic appearance.  Patient denies chest pain or shortness of breath.  Alert, awake and oriented x3 able to follow commands and answer questions appropriately.  Overall with good insight.   Cardiovascular: S1 and S2, no rubs, no gallops, no murmurs, no JVD.  Respiratory: Good air movement bilaterally, no wheezing, no crackles.  Abdomen: Soft, nontender, nondistended, positive bowel sounds  Musculoskeletal: No edema, no cyanosis, no clubbing.  Neurology: Cranial nerves grossly intact, no dysarthria, no focal motor deficit.  Data Reviewed: Basic Metabolic Panel: Recent Labs  Lab 07/27/17 1028 07/28/17 0401 07/29/17 0629 07/31/17 0727  NA 139 138  --  142  K 3.7 3.3*  --  3.7  CL 103 106  --  110  CO2 25 24  --  24  GLUCOSE 151* 91  --  120*  BUN 19 17  --  19  CREATININE 1.38* 1.41* 1.46* 1.33*  CALCIUM 9.0 8.0*  --  8.2*   CBC: Recent Labs  Lab 07/27/17 1028 07/28/17 0401 07/31/17 0727  WBC 9.5 6.7 5.0  NEUTROABS 7.2  --   --   HGB 15.9 13.6 13.1  HCT 49.5 42.9 42.5  MCV 88.6 89.9 90.4  PLT 158 130* 140*   CBG: Recent Labs  Lab 07/30/17 1629 07/30/17 2104 07/31/17 0726  07/31/17 1129 07/31/17 1643  GLUCAP 148* 150* 118* 147* 105*    Recent Results (from the past 240 hour(s))  Culture, Urine     Status: Abnormal   Collection Time: 07/27/17  7:20 AM  Result Value Ref Range Status   Specimen Description   Final    URINE, RANDOM Performed at [facility], [address], [place], [place] [address]    Special Requests   Final    NONE Performed at [facility], [address], [place], [place] [address]    Culture MULTIPLE SPECIES PRESENT, SUGGEST RECOLLECTION (A)  Final   Report Status 07/29/2017 FINAL  Final  Culture, blood (Routine X 2) w Reflex to ID Panel     Status: None (Preliminary result)   Collection Time: 07/27/17 12:42 PM  Result Value Ref Range Status   Specimen Description BLOOD LEFT FOREARM  Final   Special Requests   Final    BOTTLES DRAWN AEROBIC AND ANAEROBIC Blood Culture adequate volume   Culture   Final    NO GROWTH 4 DAYS Performed at [facility], [address], [place],  [address]    Report Status PENDING  Incomplete  Culture, blood (Routine X 2) w Reflex to ID Panel     Status: None (Preliminary result)   Collection Time: 07/27/17 12:43 PM  Result Value Ref Range Status   Specimen Description BLOOD LEFT WRIST  Final   Special Requests   Final    BOTTLES DRAWN AEROBIC ONLY Blood Culture adequate  volume   Culture   Final    NO GROWTH 4 DAYS Performed at [facility], [address], [place], [place] [address]    Report Status PENDING  Incomplete  CSF culture     Status: None   Collection Time: 07/27/17  3:12 PM  Result Value Ref Range Status   Specimen Description   Final    CSF Performed at [facility], [address], [place], [place] [address]    Special Requests   Final    NONE Performed at [facility], [address], [place], [place] [address]    Gram Stain   Final    NO ORGANISMS SEEN CYTOSPIN SMEAR Performed at [facility] NO WBC SEEN Performed at [facility], [address],  [place], [place] [address]    Culture   Final    NO GROWTH 3 DAYS Performed at [facility], [place] [address] [place], [place] [address]    Report Status 07/31/2017 FINAL  Final  MRSA PCR Screening     Status: None   Collection Time: 07/27/17 11:36 PM  Result Value Ref Range Status   MRSA by PCR NEGATIVE NEGATIVE Final    Comment:        The GeneXpert MRSA Assay (FDA approved for NASAL specimens only), is one component of a comprehensive MRSA colonization surveillance program. It is not intended to diagnose MRSA infection nor to guide or monitor treatment for MRSA infections. Performed at [facility], [address], [place], [place] [address]      Studies: No results found.  Scheduled Meds: .  stroke: mapping our early stages of recovery book   Does not apply Once  . aspirin EC  81 mg Oral Daily  . betaxolol  1 drop Both Eyes BH-q7a  . carvedilol  12.5 mg Oral BID WC  . dorzolamide  1 drop Left Eye BID  . fluticasone  1 spray Each Nare Daily  . fluticasone furoate-vilanterol  1 puff Inhalation Daily  . heparin  5,000 Units Subcutaneous Q8H  . insulin aspart  0-9 Units Subcutaneous TID WC  . insulin detemir  5 Units Subcutaneous QHS  . latanoprost  1 drop Both Eyes Daily  . loratadine  10 mg Oral Daily  . Netarsudil Dimesylate  1 drop Ophthalmic QHS  . omega-3 acid ethyl esters  1 g Oral BID  . pantoprazole  40 mg Oral Daily  . pilocarpine  1 drop Left Eye QID  . tamsulosin  0.4 mg Oral Daily  . umeclidinium bromide  1 puff Inhalation Daily   Continuous Infusions: . cefTRIAXone (ROCEPHIN)  IV 2 g (07/31/17 1655)  . vancomycin 1,250 mg (07/31/17 1731)   Time spent: 30 minutes   [facility] Pager [phone]. If 7PM-7AM, please contact night-coverage at [email or web address], password [email or web address] 07/31/2017, 6:14 PM  LOS: 4 days

## 2017-07-31 NOTE — Progress Notes (Signed)
Physical Therapy Treatment Patient Details Name: Anthony Murray MRN: [id number] DOB: 1928/01/30 Today's Date: 07/31/2017    History of Present Illness RENARD CAPERTON is a 84 year old male with a past medical history significant for chronic kidney disease stage III, type 2 diabetes, hypertension, BPH, GERD, glaucoma and COPD; who presented to the emergency department secondary to fever, mechanical fall, changes in mentation and some headache.  Per family patient was doing great until the night prior to admission and then suddenly very early in the morning after he woke up he experienced to episodes back to back of falling with the last one hitting his head; he has been complaining of feeling and some upper respiratory infection symptoms with some ongoing congestion and a recent use of Mucinex DM to try to break up loose some phlegm. No CP, no hemoptysis, no complaints of dysuria, nausea, vomiting or any other complaints.     PT Comments    Pt progressing well with pt more at a mod I than a min guard with bed mobility. Pt demonstrating  Improved activity tolerance.    Follow Up Recommendations  Home health PT;Supervision for mobility/OOB     Equipment Recommendations  Rolling walker with 5" wheels    Recommendations for Other Services       Precautions / Restrictions Precautions Precautions: None Restrictions Weight Bearing Restrictions: No    Mobility  Bed Mobility   Bed Mobility: Supine to Sit     Supine to sit: Min assist;Modified independent (Device/Increase time)     General bed mobility comments: min A for upper body  Transfers Overall transfer level: Modified independent Equipment used: Rolling walker (2 wheeled) Transfers: Sit to/from [facility] Sit to Stand: Modified independent (Device/Increase time) Stand pivot transfers: Min assist       General transfer comment: impulsive even with RW; let RW get way out in front of him and required  assistance to keep it close  Ambulation/Gait   Ambulation Distance (Feet): 65 Feet Assistive device: Rolling walker (2 wheeled) Gait Pattern/deviations: Decreased step length - right;Decreased step length - left;Step-through pattern   Gait velocity interpretation: Below normal speed for age/gender     Stairs            Wheelchair Mobility    Modified Rankin (Stroke Patients Only)          Cognition  alert and awake                                             Exercises General Exercises - Lower Extremity Gluteal Sets: (bridging x 10) Long Arc Quad: 10 reps;Both Hip ABduction/ADduction: 10 reps;Strengthening Straight Leg Raises: 10 reps;Strengthening    General Comments        Pertinent Vitals/Pain Pain Assessment: No/denies pain    Home Living                      Prior Function            PT Goals (current goals can now be found in the care plan section)      Frequency    7X/week      PT Plan Current plan remains appropriate          End of Session Equipment Utilized During Treatment: Gait belt Activity Tolerance: Patient tolerated treatment well Patient left: in bed;with bed  alarm set Nurse Communication: Mobility status PT Visit Diagnosis: Unsteadiness on feet (R26.81);Other abnormalities of gait and mobility (R26.89);Muscle weakness (generalized) (M62.81)     Time: 3958-4417 PT Time Calculation (min) (ACUTE ONLY): 27 min  Charges:  $Gait Training: 8-22 mins $Therapeutic Exercise: 8-22 mins                          Rayetta Humphrey, PT CLT [phone] 07/31/2017, 2:30 PM

## 2017-08-01 DIAGNOSIS — R509 Fever, unspecified: Secondary | ICD-10-CM

## 2017-08-01 DIAGNOSIS — I779 Disorder of arteries and arterioles, unspecified: Secondary | ICD-10-CM

## 2017-08-01 DIAGNOSIS — R9439 Abnormal result of other cardiovascular function study: Secondary | ICD-10-CM

## 2017-08-01 LAB — CULTURE, BLOOD (ROUTINE X 2)
CULTURE: NO GROWTH
CULTURE: NO GROWTH
Special Requests: ADEQUATE
Special Requests: ADEQUATE

## 2017-08-01 LAB — GLUCOSE, CAPILLARY
GLUCOSE-CAPILLARY: 117 mg/dL — AB (ref 65–99)
Glucose-Capillary: 133 mg/dL — ABNORMAL HIGH (ref 65–99)
Glucose-Capillary: 124 mg/dL — ABNORMAL HIGH (ref 65–99)

## 2017-08-01 LAB — BASIC METABOLIC PANEL
Anion gap: 10 (ref 5–15)
BUN: 20 mg/dL (ref 6–20)
CALCIUM: 8.7 mg/dL — AB (ref 8.9–10.3)
CO2: 22 mmol/L (ref 22–32)
CREATININE: 1.33 mg/dL — AB (ref 0.61–1.24)
Chloride: 107 mmol/L (ref 101–111)
GFR calc Af Amer: 54 mL/min — ABNORMAL LOW (ref >60)
GFR, EST NON AFRICAN AMERICAN: 46 mL/min — AB (ref >60)
GLUCOSE: 131 mg/dL — AB (ref 65–99)
Potassium: 3.8 mmol/L (ref 3.5–5.1)
SODIUM: 139 mmol/L (ref 135–145)

## 2017-08-01 MED ORDER — SODIUM CHLORIDE 0.9 % IV SOLN
2.0000 g | INTRAVENOUS | Status: DC
  Administered 2017-08-01: 2 g via INTRAVENOUS
  Filled 2017-08-01: qty 20

## 2017-08-01 MED ORDER — CEFTRIAXONE IV (FOR PTA / DISCHARGE USE ONLY): 2.0000 g | INTRAVENOUS | 0 refills | Status: DC

## 2017-08-01 MED ORDER — SODIUM CHLORIDE 0.9 % IV SOLN
1.0000 g | INTRAVENOUS | Status: DC
  Filled 2017-08-01: qty 10

## 2017-08-01 MED ORDER — VANCOMYCIN IV (FOR PTA / DISCHARGE USE ONLY): 1250.0000 mg | INTRAVENOUS | 0 refills | Status: DC

## 2017-08-01 MED ORDER — OMEGA-3-ACID ETHYL ESTERS 1 G PO CAPS: 1.0000 g | ORAL_CAPSULE | Freq: Two times a day (BID) | ORAL | 1 refills | Status: DC

## 2017-08-01 NOTE — Progress Notes (Signed)
IV removed to left forearm, patient tolerated well, 2x2 gauze and paper tape applied to site.  Reviewed AVS with patient, patient's daughter and patient's granddaughter.  All verbalized understanding.  Patient to be transported home by daughter.

## 2017-08-01 NOTE — Discharge Summary (Signed)
Physician Discharge Summary  Anthony Murray WUX:[id number] DOB: 10-01-1927 DOA: 07/27/2017  PCP: Phillips Odor, MD  Admit date: 07/27/2017 Discharge date: 08/01/2017  Time spent: 35 minutes  Recommendations for Outpatient Follow-up:  1. Repeat basic metabolic panel to follow electrolytes and renal function  Discharge Diagnoses:  Principal Problem:   Acute encephalopathy Active Problems:   HLD (hyperlipidemia)   Essential hypertension   CEREBROVASCULAR DISEASE   Type 2 diabetes mellitus with complications (HCC)   CKD (chronic kidney disease), stage III (HCC)   Glaucoma   Acute endocarditis   Abnormal carotid duplex scan   Fever, unspecified   Discharge Condition: Stable and improved.  Patient discharged home with home health services for IV antibiotics and physical therapy.  Follow-up with PCP in 2 weeks and with infectious disease service in 3 weeks.  Plan is for a total of 4 weeks of IV antibiotics using vancomycin and Rocephin.  Diet recommendation: Heart healthy and modified carbohydrates diet; dysphagia 3 with thin liquids recommended by speech therapy.  Filed Weights   07/30/17 0331 07/31/17 0523 08/01/17 0421  Weight: 86.6 kg (190 lb 14.7 oz) 86.3 kg (190 lb 4.1 oz) 86 kg (189 lb 9.6 oz)    History of present illness:  84 year old male with a past medical history significant for chronic kidney disease stage III, type 2 diabetes, hypertension, BPH, GERD, glaucoma and COPD; who presented to the emergency department secondary to fever, mechanical fall, changes in mentation and some headache.  Per family patient was doing great until the night prior to admission and then suddenly very early in the morning after he woke up he experienced to episodes back to back of falling with the last one hitting his head; he has been complaining of feeling and some upper respiratory infection symptoms with some ongoing congestion and a recent use of Mucinex DM to try to break up loose some phlegm.  No CP, no hemoptysis, no complaints of dysuria, nausea, vomiting or any other complaints.  After the second fall he reports having some headaches and also some eye discomfort.    Of note during my evaluation patient was a status post benzodiazepines and Haldol in order to assist with ongoing agitation and to allow patient to have an MRI of the brain.  Hospital Course:  1-Acute encephalopathy: etiology unclear, but most likely associated with acute septic emboli from endocarditis. -MRI motion degraded but suggesting acute/subacute lesions in cortical/white matter -appreciate assistance from neurology service -continue treatment with antibiotics for endocarditis (see below) -HIV and RPR non reactive, B12 and TSH WNL -patient w/o neck rigidity and currently without fever  -follow clinical response  -following ID rec's, will discharge on treatment using vancomycin and rocephin and will treat for 4 weeks (final date 08/27/17).  -outpatient follow up with ID in 3 weeks.   2-Sepsis: due to endocarditis -patient with fever, tachycardia and mild elevation on RR on admission. -positive endocarditis and vegetation appreciated on 2-D echo and TEE -follow final blood cx's results (so far 96 hours, no growth and patient afebrile) -following ID rec's for neg culture endocarditis will use vancomycin and rocephin and treat for 4 weeks. -PICC line place in right arm  -sepsis features resolved  3-HLD (hyperlipidemia) -LDL 108 -with intolerance to statins -continue lovaza -repeat lipid panel in 6 weeks   4-Essential hypertension -BP has remained stable. -continue current antihypertensive regimen. -heart healthy diet recommended.  5-prior hx ofCEREBROVASCULAR DISEASE: now with new 2 different focal lesions affecting cortical and white matter. -  continue ASA 3m daily -appreciate neurology inputs and rec's -Presentation compatible with septic emboli due to endocarditis. -Antibiotics as  recommended by ID.  6-Type 2 diabetes mellitus with complications (HCC) -Continue modified carbohydrate diet -Resume home hypoglycemic regimen  7-CKD (chronic kidney disease), stage III (HCC) -Creatinine has remained at 1.3-1.44 range (which is baseline) -Will recommend follow renal function trend while receiving IV antibiotics. infusion.  8-Glaucoma -denying eye pain currently -Continue home eyedrop regimen. -per family, patient to have eye surgery next week; which will need to be rescheduled.  9-BPH -Stable and unchanged -Continue Flomax.   -No signs of acute urinary retention symptoms   10-sinusitis -continue flonase  -Will be on vancomycin and Rocephin now (good coverage anyway for potential underlying sinus infection)  Procedures:  See below for x-ray reports   Echo: with positive aortic vegetation  Carotid duplex: Right: Color duplex indicates moderate heterogeneous plaque, with no hemodynamically significant stenosis by duplex criteria.  Left: Heterogeneous plaque at the left carotid bifurcation contributes to 50%-69% stenosis   TEE: Positive aortic vegetation, preserved ejection fraction, no wall motion abnormalities.  Consultations:  Cardiology  Neurology  ID  Discharge Exam: Vitals:   08/01/17 0820 08/01/17 1220  BP: (!) 174/82 (!) 159/79  Pulse: 77 77  Resp: 18 18  Temp: 98.2 F (36.8 C) 98.2 F (36.8 C)  SpO2: 97% 96%    General: No fever, nontoxic appearance.  Patient denies chest pain or shortness of breath.  Alert, awake and oriented x3 able to follow commands and answer questions appropriately.  Overall with good insight.   Cardiovascular: S1 and S2, no rubs, no gallops, no murmurs, no JVD.  Respiratory: Good air movement bilaterally, no wheezing, no crackles.  Abdomen: Soft, nontender, nondistended, positive bowel sounds  Musculoskeletal: No edema, no cyanosis, no clubbing.  Neurology: Cranial nerves grossly intact, no  dysarthria, no focal motor deficit.     Discharge Instructions   Discharge Instructions    Diet - low sodium heart healthy   Complete by:  As directed    Discharge instructions   Complete by:  As directed    Take medications as prescribed Follow dysphagia 3 with liquid diets Keep yourself well-hydrated Arrange follow-up with PCP in 10 days Follow-up with infectious disease in about 3 weeks (Dr. CMegan Salon. [facility]   Home infusion instructions [facility] May follow [place] Protocol; May administer Cathflo as needed to maintain patency of vascular access device.; Flushing of vascular access device: per [facility]Protocol: 0.9% NaCl pre/post medica...   Complete by:  As directed    Instructions:  May follow [facility] Protocol   Instructions:  May administer Cathflo as needed to maintain patency of vascular access device.   Instructions:  Flushing of vascular access device: per [facility]: 0.9% NaCl pre/post medication administration and prn patency; Heparin 100 u/ml, 543mfor implanted ports and Heparin 10u/ml, 75m82mor all other central venous catheters.   Instructions:  May follow [facility] Anaphylaxis Protocol for First Dose Administration in the home: 0.9% NaCl at 25-50 ml/hr to maintain IV access for protocol meds. Epinephrine 0.3 ml IV/IM PRN and Benadryl 25-50 IV/IM PRN s/s of anaphylaxis.   Instructions:  [facility] Coordinator (RN) to assist per patient IV care needs in the home PRN.     Allergies as of 08/01/2017      Reactions   Codeine    Ezetimibe-simvastatin    REACTION: myalgias   Naproxen  REACTION: and all nonsteroidals   Niacin    Pravastatin Sodium    REACTION: myalgias no      Medication List    TAKE these medications   acetaminophen 325 MG tablet Commonly known as:  TYLENOL Take 650 mg by mouth every 4 (four) hours as needed.   albuterol 108 (90 Base) MCG/ACT inhaler Commonly known  as:  PROVENTIL HFA;VENTOLIN HFA Inhale 2 puffs into the lungs every 4 (four) hours as needed for wheezing or shortness of breath.   aspirin 81 MG tablet Take 81 mg by mouth 2 (two) times daily.   betaxolol 0.5 % ophthalmic suspension Commonly known as:  BETOPTIC-S Place 1 drop into both eyes every morning.   bimatoprost 0.01 % Soln Commonly known as:  LUMIGAN Place 1 drop into both eyes daily.   carvedilol 12.5 MG tablet Commonly known as:  COREG Take 12.5 mg by mouth 2 (two) times daily with a meal.   cefTRIAXone IVPB Commonly known as:  ROCEPHIN Inject 2 g into the vein daily for 27 days. Indication:  Culture negative endocarditis Last Day of Therapy:  08/27/2017 Labs - Once weekly:  CBC/D and BMP, Labs - Every other week:  ESR and CRP   Cholecalciferol 1000 units tablet Take 1,000 Units by mouth daily.   Cranberry 500 MG Caps Take 1 capsule by mouth daily.   dorzolamide 2 % ophthalmic solution Commonly known as:  TRUSOPT Place 1 drop into the left eye 2 (two) times daily.   furosemide 20 MG tablet Commonly known as:  LASIX Take 20 mg by mouth daily.   glipiZIDE 5 MG tablet Commonly known as:  GLUCOTROL Take 5 mg by mouth daily before breakfast.   iron polysaccharides 150 MG capsule Commonly known as:  NIFEREX Take 150 mg by mouth daily.   omega-3 acid ethyl esters 1 g capsule Commonly known as:  LOVAZA Take 1 capsule (1 g total) by mouth 2 (two) times daily.   pantoprazole 40 MG tablet Commonly known as:  PROTONIX Take 40 mg by mouth daily.   pilocarpine 2 % ophthalmic solution Commonly known as:  PILOCAR Place 1 drop into the left eye 4 (four) times daily.   potassium chloride 10 MEQ tablet Commonly known as:  K-DUR Take 10 mEq by mouth daily.   RHOPRESSA 0.02 % Soln Generic drug:  Netarsudil Dimesylate Apply 1 drop to eye at bedtime. Left eye   sitaGLIPtin 50 MG tablet Commonly known as:  JANUVIA Take 50 mg by mouth daily.   tamsulosin 0.4 MG  Caps capsule Commonly known as:  FLOMAX Take 0.4 mg by mouth daily.   traMADol 50 MG tablet Commonly known as:  ULTRAM Take 50 mg by mouth every 6 (six) hours as needed.   TRELEGY ELLIPTA 100-62.5-25 MCG/INH Aepb Generic drug:  Fluticasone-Umeclidin-Vilant Inhale 1 puff into the lungs daily.   vancomycin IVPB Inject 1,250 mg into the vein daily for 27 days. Indication:  Culture negative endocarditis Last Day of Therapy:  08/27/2017 Labs - Sunday/Monday:  CBC/D, BMP, and vancomycin trough. Labs - Thursday:  BMP and vancomycin trough Labs - Every other week:  ESR and CRP            Home Infusion Instuctions  (From admission, onward)        Start     Ordered   08/01/17 0000  Home infusion instructions [facility] May follow [place] Dosing Protocol; May administer Cathflo as needed to maintain patency of vascular  access device.; Flushing of vascular access device: per [facility] Protocol: 0.9% NaCl pre/post medica...    Question Answer Comment  Instructions May follow [place] Dosing Protocol   Instructions May administer Cathflo as needed to maintain patency of vascular access device.   Instructions Flushing of vascular access device: per [facility] Protocol: 0.9% NaCl pre/post medication administration and prn patency; Heparin 100 u/ml, 29m for implanted ports and Heparin 10u/ml, 559mfor all other central venous catheters.   Instructions May follow [facility] Anaphylaxis Protocol for First Dose Administration in the home: 0.9% NaCl at 25-50 ml/hr to maintain IV access for protocol meds. Epinephrine 0.3 ml IV/IM PRN and Benadryl 25-50 IV/IM PRN s/s of anaphylaxis.   Instructions [facility] Infusion Coordinator (RN) to assist per patient IV care needs in the home PRN.      08/01/17 1333       Durable Medical Equipment  (From admission, onward)        Start     Ordered   07/31/17 1815  For home use only DME Walker rolling  Once    Question:  Patient needs a walker to  treat with the following condition  Answer:  Physical deconditioning   07/31/17 1814     Allergies  Allergen Reactions  . Codeine   . Ezetimibe-Simvastatin     REACTION: myalgias  . Naproxen     REACTION: and all nonsteroidals  . Niacin   . Pravastatin Sodium     REACTION: myalgias no   Follow-up Information    DoPhillips OdorMD. Schedule an appointment as soon as possible for a visit in 2 week(s).   Specialty:  Neurology Contact information: [address] [phone]        CaMichel BickersMD. Schedule an appointment as soon as possible for a visit in 3 week(s).   Specialty:  Infectious Diseases Contact information: [address] [place] [phone]          The results of significant diagnostics from this hospitalization (including imaging, microbiology, ancillary and laboratory) are listed below for reference.    Significant Diagnostic Studies: Dg Chest 2 View  Result Date: 07/27/2017 CLINICAL DATA:  Altered mental status EXAM: CHEST  2 VIEW COMPARISON:  02/02/2017 FINDINGS: Cardiac shadow is mildly enlarged but stable. Aortic calcifications are again seen and stable. The lungs are well aerated bilaterally. No focal infiltrate or sizable effusion is seen. No acute bony abnormality is noted. IMPRESSION: No acute abnormality noted. Electronically Signed   By: MaInez Catalina.D.   On: 07/27/2017 10:01   Ct Head Wo Contrast  Result Date: 07/27/2017 CLINICAL DATA:  Pain following fall EXAM: CT HEAD WITHOUT CONTRAST CT CERVICAL SPINE WITHOUT CONTRAST TECHNIQUE: Multidetector CT imaging of the head and cervical spine was performed following the standard protocol without intravenous contrast. Multiplanar CT image reconstructions of the cervical spine were also generated. COMPARISON:  Head CT February 02, 2017 FINDINGS: CT HEAD FINDINGS Brain: There is moderate diffuse atrophy, stable. There is no appreciable  intracranial mass, hemorrhage, extra-axial fluid collection, or midline shift. There is extensive small vessel disease in the centra semiovale bilaterally. Small vessel disease is noted in each internal and external capsule. No acute infarct is demonstrable. Vascular: There is no hyperdense vessel appreciable. There is calcification in each carotid siphon region and in each distal vertebral artery. Skull: Bony calvarium appears intact. Sinuses/Orbits: There is opacification multiple ethmoid air cells. There is  mucosal thickening in each visualized maxillary antrum. There is opacification in the posterior right sphenoid sinus as well. Orbits appear symmetric bilaterally. Other: Mastoid air cells are clear. CT CERVICAL SPINE FINDINGS Alignment: There is 3 mm of anterolisthesis of C6 on C7. There is 1 mm of retrolisthesis of C5 on C6. No other appreciable spondylolisthesis. Skull base and vertebrae: The skull base and craniocervical junction regions appear normal. There is no evident acute fracture. There are no blastic or lytic bone lesions. Soft tissues and spinal canal: Prevertebral soft tissues and predental space regions are normal. No paraspinous lesion. No cord or canal hematoma evident. Disc levels: There is moderately severe disc space narrowing at C3-4 and C5-6. There is moderate disc space narrowing at C4-5, C6-7, and C7-T1. There is multifocal facet osteoarthritic change. There is exit foraminal narrowing at multiple levels due to bony overgrowth. No frank disc extrusion or stenosis is evident. Upper chest: Visualized upper lung zone regions are clear. Other: There is bilateral carotid artery calcification. IMPRESSION: CT head: Atrophy with supratentorial small vessel disease. No acute infarct. No mass or hemorrhage. There are foci of arterial vascular calcification. There are foci of paranasal sinus disease. CT cervical spine: No fracture. Areas of mild spondylolisthesis are felt to be due to underlying  spondylosis. There is multilevel osteoarthritic change with exit foraminal narrowing at multiple levels. There is carotid artery calcification bilaterally. Electronically Signed   By: Lowella Grip III M.D.   On: 07/27/2017 09:26   Ct Cervical Spine Wo Contrast  Result Date: 07/27/2017 CLINICAL DATA:  Pain following fall EXAM: CT HEAD WITHOUT CONTRAST CT CERVICAL SPINE WITHOUT CONTRAST TECHNIQUE: Multidetector CT imaging of the head and cervical spine was performed following the standard protocol without intravenous contrast. Multiplanar CT image reconstructions of the cervical spine were also generated. COMPARISON:  Head CT February 02, 2017 FINDINGS: CT HEAD FINDINGS Brain: There is moderate diffuse atrophy, stable. There is no appreciable intracranial mass, hemorrhage, extra-axial fluid collection, or midline shift. There is extensive small vessel disease in the centra semiovale bilaterally. Small vessel disease is noted in each internal and external capsule. No acute infarct is demonstrable. Vascular: There is no hyperdense vessel appreciable. There is calcification in each carotid siphon region and in each distal vertebral artery. Skull: Bony calvarium appears intact. Sinuses/Orbits: There is opacification multiple ethmoid air cells. There is mucosal thickening in each visualized maxillary antrum. There is opacification in the posterior right sphenoid sinus as well. Orbits appear symmetric bilaterally. Other: Mastoid air cells are clear. CT CERVICAL SPINE FINDINGS Alignment: There is 3 mm of anterolisthesis of C6 on C7. There is 1 mm of retrolisthesis of C5 on C6. No other appreciable spondylolisthesis. Skull base and vertebrae: The skull base and craniocervical junction regions appear normal. There is no evident acute fracture. There are no blastic or lytic bone lesions. Soft tissues and spinal canal: Prevertebral soft tissues and predental space regions are normal. No paraspinous lesion. No cord or canal  hematoma evident. Disc levels: There is moderately severe disc space narrowing at C3-4 and C5-6. There is moderate disc space narrowing at C4-5, C6-7, and C7-T1. There is multifocal facet osteoarthritic change. There is exit foraminal narrowing at multiple levels due to bony overgrowth. No frank disc extrusion or stenosis is evident. Upper chest: Visualized upper lung zone regions are clear. Other: There is bilateral carotid artery calcification. IMPRESSION: CT head: Atrophy with supratentorial small vessel disease. No acute infarct. No mass or hemorrhage. There are foci of arterial  vascular calcification. There are foci of paranasal sinus disease. CT cervical spine: No fracture. Areas of mild spondylolisthesis are felt to be due to underlying spondylosis. There is multilevel osteoarthritic change with exit foraminal narrowing at multiple levels. There is carotid artery calcification bilaterally. Electronically Signed   By: Lowella Grip III M.D.   On: 07/27/2017 09:26   Mr Jodene Nam Head Wo Contrast  Result Date: 07/27/2017 CLINICAL DATA:  Altered mental status.  Fall. EXAM: MRI HEAD WITHOUT CONTRAST MRA HEAD WITHOUT CONTRAST TECHNIQUE: Multiplanar, multiecho pulse sequences of the brain and surrounding structures were obtained without intravenous contrast. Angiographic images of the head were obtained using MRA technique without contrast. COMPARISON:  Head CT 07/27/2017 FINDINGS: MRI HEAD FINDINGS The examination had to be discontinued prior to completion due to patient's mental status and inability to tolerate the examination despite receiving medication. Axial and coronal diffusion, sagittal T1, and axial T2 sequences were obtained and are moderately motion degraded. Brain: Small foci of acute cortical/subcortical infarction are noted in the right frontal and right occipital lobes, and there is also a small infarct in the posterior right centrum semiovale. No gross intracranial hemorrhage, mass/mass effect, or  extra-axial fluid collection is identified. There is advanced cerebral atrophy. Chronic lacunar infarcts are noted in the deep cerebral white matter bilaterally as well as likely in the right pons. Confluent T2 hyperintensity throughout the cerebral white matter is nonspecific but compatible with extensive chronic small vessel ischemic disease. Vascular: Major intracranial vascular flow voids are preserved. Skull and upper cervical spine: Grossly unremarkable bone marrow signal. Moderate pannus about the dens resulting in mild upper cervical spinal stenosis but without evidence of cord compression. Sinuses/Orbits: Bilateral cataract extraction. Trace right sphenoid sinus fluid. Clear mastoid air cells. Other: None. MRA HEAD FINDINGS The study is moderately to severely motion degraded. The vertebral arteries are patent to the basilar with the left being dominant. The basilar artery is patent without gross high-grade stenosis. The proximal right P1 segment is patent, however there is loss of flow related enhancement in the distal P1 segment which is somewhat asymmetric compared to the contralateral side and which could reflect artifactual signal loss versus occlusion or high-grade stenosis. The left P1 segment is grossly patent with presumably artifactual signal loss of the P2 and more distal PCA branches. The internal carotid arteries are patent from skull base to carotid termini with limited assessment for stenosis due to artifact. The proximal to mid M1 segments are patent bilaterally, however severe artifact limits assessment of the MCA bifurcations and branch vessels. The right A1 segment in both A2 segments are grossly patent. The left A1 segment is not well seen, potentially small and obscured by motion. IMPRESSION: 1. Motion degraded, incomplete examination. 2. Scattered small acute cortical and white matter infarcts in the right cerebral hemisphere. 3. Advanced chronic small vessel ischemic disease and  cerebral atrophy. 4. Severely motion degraded head MRA as above. Electronically Signed   By: Logan Bores M.D.   On: 07/27/2017 12:22   Mr Brain Wo Contrast  Result Date: 07/27/2017 CLINICAL DATA:  Altered mental status.  Fall. EXAM: MRI HEAD WITHOUT CONTRAST MRA HEAD WITHOUT CONTRAST TECHNIQUE: Multiplanar, multiecho pulse sequences of the brain and surrounding structures were obtained without intravenous contrast. Angiographic images of the head were obtained using MRA technique without contrast. COMPARISON:  Head CT 07/27/2017 FINDINGS: MRI HEAD FINDINGS The examination had to be discontinued prior to completion due to patient's mental status and inability to tolerate the examination despite  receiving medication. Axial and coronal diffusion, sagittal T1, and axial T2 sequences were obtained and are moderately motion degraded. Brain: Small foci of acute cortical/subcortical infarction are noted in the right frontal and right occipital lobes, and there is also a small infarct in the posterior right centrum semiovale. No gross intracranial hemorrhage, mass/mass effect, or extra-axial fluid collection is identified. There is advanced cerebral atrophy. Chronic lacunar infarcts are noted in the deep cerebral white matter bilaterally as well as likely in the right pons. Confluent T2 hyperintensity throughout the cerebral white matter is nonspecific but compatible with extensive chronic small vessel ischemic disease. Vascular: Major intracranial vascular flow voids are preserved. Skull and upper cervical spine: Grossly unremarkable bone marrow signal. Moderate pannus about the dens resulting in mild upper cervical spinal stenosis but without evidence of cord compression. Sinuses/Orbits: Bilateral cataract extraction. Trace right sphenoid sinus fluid. Clear mastoid air cells. Other: None. MRA HEAD FINDINGS The study is moderately to severely motion degraded. The vertebral arteries are patent to the basilar with the  left being dominant. The basilar artery is patent without gross high-grade stenosis. The proximal right P1 segment is patent, however there is loss of flow related enhancement in the distal P1 segment which is somewhat asymmetric compared to the contralateral side and which could reflect artifactual signal loss versus occlusion or high-grade stenosis. The left P1 segment is grossly patent with presumably artifactual signal loss of the P2 and more distal PCA branches. The internal carotid arteries are patent from skull base to carotid termini with limited assessment for stenosis due to artifact. The proximal to mid M1 segments are patent bilaterally, however severe artifact limits assessment of the MCA bifurcations and branch vessels. The right A1 segment in both A2 segments are grossly patent. The left A1 segment is not well seen, potentially small and obscured by motion. IMPRESSION: 1. Motion degraded, incomplete examination. 2. Scattered small acute cortical and white matter infarcts in the right cerebral hemisphere. 3. Advanced chronic small vessel ischemic disease and cerebral atrophy. 4. Severely motion degraded head MRA as above. Electronically Signed   By: Logan Bores M.D.   On: 07/27/2017 12:22   US Carotid Bilateral (at [facility] Only)  Result Date: 07/28/2017 CLINICAL DATA:  84 year old male with a history of vascular disease. Cardiovascular risk factors include hypertension EXAM: BILATERAL CAROTID DUPLEX ULTRASOUND TECHNIQUE: Pearline Cables scale imaging, color Doppler and duplex ultrasound were performed of bilateral carotid and vertebral arteries in the neck. COMPARISON:  05/29/2015 FINDINGS: Criteria: Quantification of carotid stenosis is based on velocity parameters that correlate the residual internal carotid diameter with NASCET-based stenosis levels, using the diameter of the distal internal carotid lumen as the denominator for stenosis measurement. The following velocity measurements were obtained:  RIGHT ICA:  Systolic 90 cm/sec, Diastolic 26 cm/sec CCA:  61 cm/sec SYSTOLIC ICA/CCA RATIO:  1.2 ECA:  107 cm/sec LEFT ICA:  Systolic 827 cm/sec, Diastolic 44 cm/sec CCA:  90 cm/sec SYSTOLIC ICA/CCA RATIO:  2.2 ECA:  150 cm/sec Right Brachial SBP: Not acquired Left Brachial SBP: Not acquired RIGHT CAROTID ARTERY: No significant calcified disease of the right common carotid artery. Intermediate waveform maintained. Heterogeneous plaque without significant calcifications at the right carotid bifurcation. Low resistance waveform of the right ICA. No significant tortuosity. RIGHT VERTEBRAL ARTERY: Antegrade flow with low resistance waveform. LEFT CAROTID ARTERY: No significant calcified disease of the left common carotid artery. Intermediate waveform maintained. Heterogeneous plaque at the left carotid bifurcation without significant calcifications. Low resistance waveform of  the left ICA. LEFT VERTEBRAL ARTERY: Not visualized IMPRESSION: Right: Color duplex indicates moderate heterogeneous plaque, with no hemodynamically significant stenosis by duplex criteria in the extracranial cerebrovascular circulation. Left: Heterogeneous plaque at the left carotid bifurcation contributes to 50%-69% stenosis by established duplex criteria. Signed, Dulcy Fanny. Earleen Newport, DO [facility] Electronically Signed   By: Corrie Mckusick D.O.   On: 07/28/2017 12:17   Dg Lumbar Puncture Fluoro Guide  Result Date: 07/27/2017 CLINICAL DATA:  Altered mental status, fever. EXAM: DIAGNOSTIC LUMBAR PUNCTURE UNDER FLUOROSCOPIC GUIDANCE FLUOROSCOPY TIME:  Radiation Exposure Index (if provided by the fluoroscopic device): 86.6 mGy. PROCEDURE: Informed consent was obtained from the patient's power of attorney prior to the procedure, including potential complications of headache, allergy, and pain. With the patient prone, the lower back was prepped with Betadine. 1% Lidocaine was used for local  anesthesia. Under fluoroscopic guidance, lumbar puncture was performed at the L3-4 level using a 22 gauge needle with return of clear CSF; left paramedian approach was utilized. Opening pressure was not obtained as the patient could not be positioned appropriately. 10 ml of CSF were obtained for laboratory studies. The patient tolerated the procedure well and there were no apparent complications. IMPRESSION: Under fluoroscopic guidance, successful lumbar puncture was performed for diagnostic purposes. Electronically Signed   By: Marijo Conception, M.D.   On: 07/27/2017 15:27    Microbiology: Recent Results (from the past 240 hour(s))  Culture, Urine     Status: Abnormal   Collection Time: 07/27/17  7:20 AM  Result Value Ref Range Status   Specimen Description   Final    URINE, RANDOM Performed at [facility], [address] [place], [place] [address]    Special Requests   Final    NONE Performed at [facility], [address], [place], [place] [address]    Culture MULTIPLE SPECIES PRESENT, SUGGEST RECOLLECTION (A)  Final   Report Status 07/29/2017 FINAL  Final  Culture, blood (Routine X 2) w Reflex to ID Panel     Status: None   Collection Time: 07/27/17 12:42 PM  Result Value Ref Range Status   Specimen Description BLOOD LEFT FOREARM  Final   Special Requests   Final    BOTTLES DRAWN AEROBIC AND ANAEROBIC Blood Culture adequate volume   Culture   Final    NO GROWTH 5 DAYS Performed at [facility], [address], [place], [place] [address]    Report Status 08/01/2017 FINAL  Final  Culture, blood (Routine X 2) w Reflex to ID Panel     Status: None   Collection Time: 07/27/17 12:43 PM  Result Value Ref Range Status   Specimen Description BLOOD LEFT WRIST  Final   Special Requests   Final    BOTTLES DRAWN AEROBIC ONLY Blood Culture adequate volume   Culture   Final    NO GROWTH 5 DAYS Performed at [facility], [address], [place], [place] [address]    Report Status 08/01/2017  FINAL  Final  CSF culture     Status: None   Collection Time: 07/27/17  3:12 PM  Result Value Ref Range Status   Specimen Description   Final    CSF Performed at [facility], [address], [place], [place] [address]    Special Requests   Final    NONE Performed at [facility], [address], [place], [place] [address]    Gram Stain   Final  NO ORGANISMS SEEN CYTOSPIN SMEAR Performed at [facility] NO WBC SEEN Performed at [facility], [address] [place], [place] [address]    Culture   Final    NO GROWTH 3 DAYS Performed at [facility], [place] [address], [place],  [address]    Report Status 07/31/2017 FINAL  Final  MRSA PCR Screening     Status: None   Collection Time: 07/27/17 11:36 PM  Result Value Ref Range Status   MRSA by PCR NEGATIVE NEGATIVE Final    Comment:        The GeneXpert MRSA Assay (FDA approved for NASAL specimens only), is one component of a comprehensive MRSA colonization surveillance program. It is not intended to diagnose MRSA infection nor to guide or monitor treatment for MRSA infections. Performed at [facility], [address], [place],  [address]      Labs: Basic Metabolic Panel: Recent Labs  Lab 07/27/17 1028 07/28/17 0401 07/29/17 0629 07/31/17 0727 08/01/17 0513  NA 139 138  --  142 139  K 3.7 3.3*  --  3.7 3.8  CL 103 106  --  110 107  CO2 25 24  --  24 22  GLUCOSE 151* 91  --  120* 131*  BUN 19 17  --  19 20  CREATININE 1.38* 1.41* 1.46* 1.33* 1.33*  CALCIUM 9.0 8.0*  --  8.2* 8.7*   CBC: Recent Labs  Lab 07/27/17 1028 07/28/17 0401 07/31/17 0727  WBC 9.5 6.7 5.0  NEUTROABS 7.2  --   --   HGB 15.9 13.6 13.1  HCT 49.5 42.9 42.5  MCV 88.6 89.9 90.4  PLT 158 130* 140*   CBG: Recent Labs  Lab 07/31/17 1129 07/31/17 1643 07/31/17 2109 08/01/17 0723 08/01/17 1108  GLUCAP 147* 105* 139* 117* 133*    Signed:  Barton Dubois MD.  [facility] 08/01/2017, 1:36  PM

## 2017-08-01 NOTE — Addendum Note (Signed)
Addendum  created 08/01/17 0732 by Vista Deck, CRNA   Intraprocedure Staff edited

## 2017-08-01 NOTE — Care Management Note (Addendum)
Case Management Note  Patient Details  Name: Anthony Murray MRN: [id number] Date of Birth: 09/27/1927  Expected Discharge Date:  08/01/17               Expected Discharge Plan:  [place] Discharge planning Services  CM Consult  Post Acute Care Choice:  Home Health, Durable Medical Equipment Choice offered to:  Patient  DME Arranged:  [facility] Agency:  [place] Arranged:  PT [facility] Agency:  [place]  Status of Service:  Completed, signed off   Additional Comments: Discharging home today with [facility] for PT/OT, nursing and IV abx. Juliann Pulse, [facility] rep, has delivered RW to pt's room. Pt has been ordered to get IV vanc givne at 12pm tomorrow. DC plan discussed with Bobby Rumpf (granddaughter) who will be administering IV abx daily. Pt referred to [facility] for Emmi calls.   Sherald Barge, RN 08/01/2017, 1:38 PM

## 2017-08-01 NOTE — Progress Notes (Signed)
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 To discharging provider:  please sign these orders via discharge navigator,  Select New Orders & click on the button choice - Manage This Unsigned Work.     Thank you for allowing pharmacy to be a part of this patient's care.  Isac Sarna, BS Vena [place], BCPS Clinical Pharmacist Pager [phone] 08/01/2017, 9:31 AM

## 2017-08-01 NOTE — Care Management Important Message (Signed)
Important Message  Patient Details  Name: Anthony Murray MRN: [id number] Date of Birth: Aug 23, 1927   Medicare Important Message Given:  Yes    Sherald Barge, RN 08/01/2017, 1:38 PM

## 2017-08-02 ENCOUNTER — Encounter (HOSPITAL_COMMUNITY): Payer: Self-pay | Admitting: Cardiology

## 2017-08-02 DIAGNOSIS — I129 Hypertensive chronic kidney disease with stage 1 through stage 4 chronic kidney disease, or unspecified chronic kidney disease: Secondary | ICD-10-CM | POA: Diagnosis not present

## 2017-08-02 DIAGNOSIS — J441 Chronic obstructive pulmonary disease with (acute) exacerbation: Secondary | ICD-10-CM | POA: Diagnosis not present

## 2017-08-02 DIAGNOSIS — M545 Low back pain: Secondary | ICD-10-CM | POA: Diagnosis not present

## 2017-08-02 DIAGNOSIS — R339 Retention of urine, unspecified: Secondary | ICD-10-CM | POA: Diagnosis not present

## 2017-08-02 DIAGNOSIS — I350 Nonrheumatic aortic (valve) stenosis: Secondary | ICD-10-CM | POA: Diagnosis not present

## 2017-08-02 DIAGNOSIS — Z792 Long term (current) use of antibiotics: Secondary | ICD-10-CM | POA: Diagnosis not present

## 2017-08-02 DIAGNOSIS — Z8673 Personal history of transient ischemic attack (TIA), and cerebral infarction without residual deficits: Secondary | ICD-10-CM | POA: Diagnosis not present

## 2017-08-02 DIAGNOSIS — Z7982 Long term (current) use of aspirin: Secondary | ICD-10-CM | POA: Diagnosis not present

## 2017-08-02 DIAGNOSIS — E785 Hyperlipidemia, unspecified: Secondary | ICD-10-CM | POA: Diagnosis not present

## 2017-08-02 DIAGNOSIS — S72012D Unspecified intracapsular fracture of left femur, subsequent encounter for closed fracture with routine healing: Secondary | ICD-10-CM | POA: Diagnosis not present

## 2017-08-02 DIAGNOSIS — Z79891 Long term (current) use of opiate analgesic: Secondary | ICD-10-CM | POA: Diagnosis not present

## 2017-08-02 DIAGNOSIS — Z6830 Body mass index (BMI) 30.0-30.9, adult: Secondary | ICD-10-CM | POA: Diagnosis not present

## 2017-08-02 DIAGNOSIS — Z7984 Long term (current) use of oral hypoglycemic drugs: Secondary | ICD-10-CM | POA: Diagnosis not present

## 2017-08-02 DIAGNOSIS — E1122 Type 2 diabetes mellitus with diabetic chronic kidney disease: Secondary | ICD-10-CM | POA: Diagnosis not present

## 2017-08-02 DIAGNOSIS — Z452 Encounter for adjustment and management of vascular access device: Secondary | ICD-10-CM | POA: Diagnosis not present

## 2017-08-02 DIAGNOSIS — E1151 Type 2 diabetes mellitus with diabetic peripheral angiopathy without gangrene: Secondary | ICD-10-CM | POA: Diagnosis not present

## 2017-08-02 DIAGNOSIS — I339 Acute and subacute endocarditis, unspecified: Secondary | ICD-10-CM | POA: Diagnosis not present

## 2017-08-02 DIAGNOSIS — R944 Abnormal results of kidney function studies: Secondary | ICD-10-CM | POA: Diagnosis not present

## 2017-08-02 DIAGNOSIS — Z5181 Encounter for therapeutic drug level monitoring: Secondary | ICD-10-CM | POA: Diagnosis not present

## 2017-08-02 DIAGNOSIS — A419 Sepsis, unspecified organism: Secondary | ICD-10-CM | POA: Diagnosis not present

## 2017-08-02 DIAGNOSIS — I2510 Atherosclerotic heart disease of native coronary artery without angina pectoris: Secondary | ICD-10-CM | POA: Diagnosis not present

## 2017-08-02 DIAGNOSIS — N400 Enlarged prostate without lower urinary tract symptoms: Secondary | ICD-10-CM | POA: Diagnosis not present

## 2017-08-02 DIAGNOSIS — Z9181 History of falling: Secondary | ICD-10-CM | POA: Diagnosis not present

## 2017-08-02 DIAGNOSIS — Z7951 Long term (current) use of inhaled steroids: Secondary | ICD-10-CM | POA: Diagnosis not present

## 2017-08-02 DIAGNOSIS — N183 Chronic kidney disease, stage 3 (moderate): Secondary | ICD-10-CM | POA: Diagnosis not present

## 2017-08-02 DIAGNOSIS — S72099A Other fracture of head and neck of unspecified femur, initial encounter for closed fracture: Secondary | ICD-10-CM | POA: Diagnosis not present

## 2017-08-02 LAB — BARTONELLA ANTIBODY PANEL
B henselae IgG: NEGATIVE titer
B henselae IgM: NEGATIVE titer
B quintana IgG: NEGATIVE titer

## 2017-08-02 LAB — BARTONELLA ANITBODY PANEL: B QUINTANA IGM: NEGATIVE {titer}

## 2017-08-03 DIAGNOSIS — N189 Chronic kidney disease, unspecified: Secondary | ICD-10-CM | POA: Diagnosis not present

## 2017-08-03 DIAGNOSIS — M84459A Pathological fracture, hip, unspecified, initial encounter for fracture: Secondary | ICD-10-CM | POA: Diagnosis not present

## 2017-08-03 DIAGNOSIS — J449 Chronic obstructive pulmonary disease, unspecified: Secondary | ICD-10-CM | POA: Diagnosis not present

## 2017-08-07 ENCOUNTER — Other Ambulatory Visit: Payer: Self-pay | Admitting: Cardiology

## 2017-08-08 ENCOUNTER — Other Ambulatory Visit: Payer: Self-pay | Admitting: Cardiology

## 2017-08-08 DIAGNOSIS — I330 Acute and subacute infective endocarditis: Secondary | ICD-10-CM | POA: Diagnosis not present

## 2017-08-08 DIAGNOSIS — Z5181 Encounter for therapeutic drug level monitoring: Secondary | ICD-10-CM | POA: Diagnosis not present

## 2017-08-08 DIAGNOSIS — M545 Low back pain: Secondary | ICD-10-CM | POA: Diagnosis not present

## 2017-08-08 LAB — MISC LABCORP TEST (SEND OUT): Labcorp test code: 86638

## 2017-08-11 DIAGNOSIS — Z5181 Encounter for therapeutic drug level monitoring: Secondary | ICD-10-CM | POA: Diagnosis not present

## 2017-08-11 DIAGNOSIS — Z792 Long term (current) use of antibiotics: Secondary | ICD-10-CM | POA: Diagnosis not present

## 2017-08-12 ENCOUNTER — Encounter: Payer: Self-pay | Admitting: Cardiology

## 2017-08-12 ENCOUNTER — Ambulatory Visit: Payer: PPO | Admitting: Cardiology

## 2017-08-12 VITALS — BP 136/84 | HR 79 | Ht 66.5 in | Wt 197.2 lb

## 2017-08-12 DIAGNOSIS — I350 Nonrheumatic aortic (valve) stenosis: Secondary | ICD-10-CM | POA: Diagnosis not present

## 2017-08-12 DIAGNOSIS — R600 Localized edema: Secondary | ICD-10-CM

## 2017-08-12 DIAGNOSIS — I2510 Atherosclerotic heart disease of native coronary artery without angina pectoris: Secondary | ICD-10-CM | POA: Diagnosis not present

## 2017-08-12 DIAGNOSIS — I10 Essential (primary) hypertension: Secondary | ICD-10-CM | POA: Diagnosis not present

## 2017-08-12 DIAGNOSIS — Z79899 Other long term (current) drug therapy: Secondary | ICD-10-CM | POA: Diagnosis not present

## 2017-08-12 DIAGNOSIS — E782 Mixed hyperlipidemia: Secondary | ICD-10-CM

## 2017-08-12 MED ORDER — FUROSEMIDE 40 MG PO TABS: 40.0000 mg | ORAL_TABLET | Freq: Every day | ORAL | 3 refills | Status: DC

## 2017-08-12 NOTE — Patient Instructions (Addendum)
Your physician wants you to follow-up in:2 months  with Dr.Branch    INCREASE Lasix to 40 mg daily    Get lab work :BMET and Magnesium in 2 weeks  (March 8th )      No tests ordered today        Thank you for choosing [facility] !

## 2017-08-12 NOTE — Progress Notes (Signed)
Clinical Summary Mr. Stuard is a 84 y.o.male seen today for follow up of the following medical problems.   1. Aortic stenosis - 08/2014 echo LVEF 55-60%, mod to severe AS (mean grad 11, AVA0.99) 08/2015 echo LVEF 32-02%, grade I diastolic dysfunction, mean grad 10, dimensionless index 0.39, AVA VTI 0.88 - 07/2017 echo mean grad 11, dimensionless index 35, AVA VTI 0.88 - 07/2017 TEE AVA planimetry 1.44 - breathing improved with previous change in inhalers.    - occasional SOB. Can have some LE edema. + orthopnea - compliant with meds. Limiting sodium intake.    2. COPD - sore throat about 2.5 weeks ago. Later developed cough, productive yellowish. Cough slowly getting better - occasional wheezing.  - followed by pcp   3. CAD - cath 11/2004 with moderate non-obstructive disease - echo 09/2012 LVEF 55-60%  - no recent chest pain  4. HL - reports statins have caused muscle aches including pravastatin  - currently diet controlled  - reports last labs with pcp  5. Carotid stenosis - Jan 2018 carotid US: RICA 3-34%, LICA 35-68%.   07/2017 Carotid US mild RICA, LICA 61-68% - no recent neuro symptoms   6. HTN - he remaisn compliant with meds  7. CKD III - followed by nephrology.   8. Endocarditis - small vegetations noted on MV and AV during 07/2017 admission - ID plans for 4 weeks of vanc and rocephin  9. CVA - ASA was increased to bid dosing from hospital notes   SH: enjoys working on garden. Past Medical History:  Diagnosis Date  . Aortic valve stenosis   . ASCVD (arteriosclerotic cardiovascular disease)    50% ostial left left anterior desending 60% mid circumflex and normal ejection fractioon in 2006 exertional dyspnea;history of chest discomfort  . Cerebrovascular disease    minimal carotid bruits  . CKD (chronic kidney disease)   . Diabetes mellitus type 2, controlled ([place])    no insulin  . Hyperlipidemia    statin intolerant  .  Hypertension   . Tobacco abuse, in remission    30 pack years stopped in 1971     Allergies  Allergen Reactions  . Codeine   . Ezetimibe-Simvastatin     REACTION: myalgias  . Naproxen     REACTION: and all nonsteroidals  . Niacin   . Pravastatin Sodium     REACTION: myalgias no     Current Outpatient Medications  Medication Sig Dispense Refill  . acetaminophen (TYLENOL) 325 MG tablet Take 650 mg by mouth every 4 (four) hours as needed.    Marland Kitchen albuterol (PROVENTIL HFA;VENTOLIN HFA) 108 (90 Base) MCG/ACT inhaler Inhale 2 puffs into the lungs every 4 (four) hours as needed for wheezing or shortness of breath.     Marland Kitchen aspirin 81 MG tablet Take 81 mg by mouth 2 (two) times daily.     . betaxolol (BETOPTIC-S) 0.5 % ophthalmic suspension Place 1 drop into both eyes every morning.     . bimatoprost (LUMIGAN) 0.01 % SOLN Place 1 drop into both eyes daily.    . carvedilol (COREG) 12.5 MG tablet Take 12.5 mg by mouth 2 (two) times daily with a meal.     . cefTRIAXone (ROCEPHIN) IVPB Inject 2 g into the vein daily for 27 days. Indication:  Culture negative endocarditis Last Day of Therapy:  08/27/2017 Labs - Once weekly:  CBC/D and BMP, Labs - Every other week:  ESR and CRP 27 Units 0  .  Cholecalciferol 1000 units tablet Take 1,000 Units by mouth daily.    . Cranberry 500 MG CAPS Take 1 capsule by mouth daily.    . dorzolamide (TRUSOPT) 2 % ophthalmic solution Place 1 drop into the left eye 2 (two) times daily.      . Fluticasone-Umeclidin-Vilant (TRELEGY ELLIPTA) 100-62.5-25 MCG/INH AEPB Inhale 1 puff into the lungs daily.    . furosemide (LASIX) 20 MG tablet Take 20 mg by mouth daily.    Marland Kitchen glipiZIDE (GLUCOTROL) 5 MG tablet Take 5 mg by mouth daily before breakfast.    . iron polysaccharides (NIFEREX) 150 MG capsule Take 150 mg by mouth daily.    Mckinley Jewel Dimesylate (RHOPRESSA) 0.02 % SOLN Apply 1 drop to eye at bedtime. Left eye    . omega-3 acid ethyl esters (LOVAZA) 1 g capsule Take 1  capsule (1 g total) by mouth 2 (two) times daily. 60 capsule 1  . pantoprazole (PROTONIX) 40 MG tablet Take 40 mg by mouth daily.    . pilocarpine (PILOCAR) 2 % ophthalmic solution Place 1 drop into the left eye 4 (four) times daily.     . potassium chloride (K-DUR) 10 MEQ tablet Take 10 mEq by mouth daily.    . sitaGLIPtin (JANUVIA) 50 MG tablet Take 50 mg by mouth daily.    . tamsulosin (FLOMAX) 0.4 MG CAPS capsule Take 0.4 mg by mouth daily.    . traMADol (ULTRAM) 50 MG tablet Take 50 mg by mouth every 6 (six) hours as needed.    . vancomycin IVPB Inject 1,250 mg into the vein daily for 27 days. Indication:  Culture negative endocarditis Last Day of Therapy:  08/27/2017 Labs - Sunday/Monday:  CBC/D, BMP, and vancomycin trough. Labs - Thursday:  BMP and vancomycin trough Labs - Every other week:  ESR and CRP 27 Units 0   No current facility-administered medications for this visit.      Past Surgical History:  Procedure Laterality Date  . APPENDECTOMY    . CARPAL TUNNEL RELEASE     surgery left 05/05/2001  . KNEE ARTHROSCOPY     bilaterial; unknown associated surgical procedure  . SHOULDER SURGERY     Right x2; third procedure on left  . TEE WITHOUT CARDIOVERSION N/A 07/29/2017   Procedure: TRANSESOPHAGEAL ECHOCARDIOGRAM (TEE) WITH PROPOFOL;  Surgeon: Satira Sark, MD;  Location: [facility];  Service: Cardiovascular;  Laterality: N/A;  . TONSILLECTOMY    . TRANSURETHRAL INCISION OF PROSTATE     resection of the prostate     Allergies  Allergen Reactions  . Codeine   . Ezetimibe-Simvastatin     REACTION: myalgias  . Naproxen     REACTION: and all nonsteroidals  . Niacin   . Pravastatin Sodium     REACTION: myalgias no      No family history on file.   Social History Mr. Lederman reports that he quit smoking about 47 years ago. His smoking use included cigarettes. He has a 40.00 pack-year smoking history. he has never used smokeless tobacco. Mr. Beauregard reports  that he does not drink alcohol.   Review of Systems CONSTITUTIONAL: No weight loss, fever, chills, weakness or fatigue.  HEENT: Eyes: No visual loss, blurred vision, double vision or yellow sclerae.No hearing loss, sneezing, congestion, runny nose or sore throat.  SKIN: No rash or itching.  CARDIOVASCULAR: per hpi RESPIRATORY: per hpi GASTROINTESTINAL: No anorexia, nausea, vomiting or diarrhea. No abdominal pain or blood.  GENITOURINARY: No burning on urination,  no polyuria NEUROLOGICAL: No headache, dizziness, syncope, paralysis, ataxia, numbness or tingling in the extremities. No change in bowel or bladder control.  MUSCULOSKELETAL: No muscle, back pain, joint pain or stiffness.  LYMPHATICS: No enlarged nodes. No history of splenectomy.  PSYCHIATRIC: No history of depression or anxiety.  ENDOCRINOLOGIC: No reports of sweating, cold or heat intolerance. No polyuria or polydipsia.  Marland Kitchen   Physical Examination Vitals:   08/12/17 1039  BP: 136/84  Pulse: 79  SpO2: 96%   Vitals:   08/12/17 1039  Weight: 197 lb 3.2 oz (89.4 kg)  Height: 5' 6.5" (1.689 m)    Gen: resting comfortably, no acute distress HEENT: no scleral icterus, pupils equal round and reactive, no palptable cervical adenopathy,  CV: RRR, 3/6 systolic murmur rusb, no jvd Resp: Clear to auscultation bilaterally GI: abdomen is soft, non-tender, non-distended, normal bowel sounds, no hepatosplenomegaly MSK: extremities are warm, 1+ bilateral edema Skin: warm, no rash Neuro:  no focal deficits Psych: appropriate affect   Diagnostic Studies 09/2012 stress echo:  Study Conclusions  - Stress ECG conclusions: There were no significant stress arrhythmias or conduction abnormalities; rare PVCs were present during exercise and in recovery. The stress ECG was negative for ischemia. - Staged echo: There was no echocardiographic evidence for stress-induced ischemia.  09/2012 echo Study Conclusions  - Left  ventricle: The cavity size was normal. Wall thickness was increased in a pattern of mild LVH. There was moderate focal basal hypertrophy of the septum. Systolic function was normal. The estimated ejection fraction was in the range of 55% to 60%. Wall motion was normal; there were no regional wall motion abnormalities. - Aortic valve: Mildly to moderately calcified annulus. Moderately thickened, moderately calcified leaflets. Valve mobility was mildly restricted. There was mild stenosis. Trivial regurgitation. Valve area: 0.9cm^2(VTI). Valve area: 0.79cm^2 (Vmax). - Aorta: The aorta was moderately calcified. - Mitral valve: Calcified annulus. Mildly calcified leaflets . - Atrial septum: No defect or patent foramen ovale was identified.  11/2004 Cath FINDINGS: 1. Patient does not have dextrocardia. The heart is normally located in  the left side of the chest. Aortic arch appears normal based on the  course of the catheter through it. 2. LV 167/19/25. 3. No aortic stenosis. 4. Left main: Angiographically normal. 5. LAD: Large vessel giving rise to a single, very large, diagonal. There  is a 50% stenosis of the ostium of the LAD. 6. Circumflex: Large vessel giving rise to three obtuse marginals. There  is a 60% stenosis of the mid vessel. 7. RCA: Tortuous, dominant vessel. It is angiographically normal.  IMPRESSION: 1. No dextrocardia. 2. No previously placed coronary stent. 3. Moderate nonobstructive coronary disease. 4. Elevated left ventricular end diastolic pressure in the setting of  systemic hypertension.  08/2014 echo Study Conclusions  - Left ventricle: The cavity size was normal. There was mild focal basal hypertrophy of the septum. Systolic function was normal. The estimated ejection fraction was in the range of 55% to 60%. Wall motion was normal; there were no regional wall  motion abnormalities. Doppler parameters are consistent with abnormal left ventricular relaxation (grade 1 diastolic dysfunction). - Aortic valve: Functionally bicuspid; moderately calcified leaflets. Cusp separation was reduced. There was moderate to severe stenosis. There was mild regurgitation. Mean gradient (S): 11 mm Hg. Peak gradient (S): 22 mm Hg. VTI ratio of LVOT to aortic valve: 0.44. Valve area (VTI): 0.99 cm^2. - Mitral valve: Calcified annulus. There was trivial regurgitation. - Left atrium: The atrium was mildly dilated. -  Right atrium: Central venous pressure (est): 8 mm Hg. - Tricuspid valve: There was trivial regurgitation. - Pulmonary arteries: Systolic pressure could not be accurately estimated. - Pericardium, extracardiac: There was no pericardial effusion.  Impressions:  - Mild basal septal hypertrophy with LVEF 55-60% and grade 1 diastolic dysfunction. Mild left atrial enlargement. Functionally bicuspid aortic valve with mild aortic regurgitation and moderate to severe aortic stenosis. Measures are somewhat discordant with relatively low gradients and LVOT/AV ratio 0.44, but planimetry and VTI valve area around 1 cm2. Unable to assess PASP.  01/2015 PFTs Mild to moderate ventilatory defect   08/2015 echo  Study Conclusions  - Left ventricle: The cavity size was normal. Wall thickness was  increased in a pattern of mild LVH. Systolic function was normal.  The estimated ejection fraction was in the range of 60% to 65%.  Wall motion was normal; there were no regional wall motion  abnormalities. Doppler parameters are consistent with abnormal  left ventricular relaxation (grade 1 diastolic dysfunction). - Aortic valve: Mildly to moderately calcified annulus.  Functionally bicuspid. There was mild regurgitation. Mean  gradient (S): 10 mm Hg. Peak gradient (S): 20 mm Hg. VTI ratio of  LVOT to aortic valve: 0.39. Valve area  (VTI): 0.88 cm^2. - Mitral valve: Calcified annulus. There was trivial regurgitation. - Left atrium: The atrium was at the upper limits of normal in  size. - Right atrium: Central venous pressure (est): 3 mm Hg. - Tricuspid valve: There was trivial regurgitation. - Pulmonary arteries: Systolic pressure could not be accurately  estimated. - Pericardium, extracardiac: There was no pericardial effusion.  Impressions:  - Mild LVH with LVEF 60-65%. Grade 1 diastolic dysfunction with  normal estimated LV filling pressure. Upper normal left atrial  chamber size. MAC with trivial mitral regurgitation. Functionally  bicuspid, calcified aortic valve with at least moderate aortic  stenosis. As mentioned in the prior study, measurements are  discordant with relatively low gradients but LVOT/AV VTI ratio  0.39. Unable to get good valve planimetry on this particular  study. There is mild aortic regurgitation.  07/2017 echo Study Conclusions  - Left ventricle: The cavity size was normal. Wall thickness was   increased in a pattern of mild LVH. Systolic function was normal.   The estimated ejection fraction was in the range of 55% to 60%.   Wall motion was normal; there were no regional wall motion   abnormalities. Doppler parameters are consistent with abnormal   left ventricular relaxation (grade 1 diastolic dysfunction). - Aortic valve: Functionally bicuspid; moderately calcified   leaflets. There was moderate to severe stenosis. There was mild   regurgitation. Mean gradient (S): 11 mm Hg. Peak gradient (S): 20   mm Hg. VTI ratio of LVOT to aortic valve: 0.35. Valve area (VTI):   0.88 cm^2. - Mitral valve: Mildly calcified annulus. There was mild   regurgitation. There is an intermittently seen echodensity seen   on the atrial side of the anterior mitral leaflet concerning for   vegetation in patient with history of fever and stroke. - Left atrium: The atrium was mildly  dilated. - Right atrium: Central venous pressure (est): 8 mm Hg. - Tricuspid valve: There was trivial regurgitation. - Pulmonary arteries: Systolic pressure could not be accurately   estimated. - Pericardium, extracardiac: There was no pericardial effusion.  Impressions:  - There is an intermittently seen echodensity seen on the atrial   side of the anterior mitral leaflet concerning for vegetation in   patient with  history of fever and stroke.   07/2017 TEE Study Conclusions  - Left ventricle: Systolic function was normal. The estimated   ejection fraction was in the range of 55% to 60%. Wall motion was   normal; there were no regional wall motion abnormalities. - Aortic valve: Small, freely mobile echodensity seen   intermittently on the ventricular side of the right coronary   aortic cusp suggestive of vegetation given clinical scenario.   There was moderate stenosis. Valve area approximately 1.4 cm2 by   planimetry. There was mild regurgitation. - Aorta: Moderate to severe diffuse atherosclerosis present. - Mitral valve: Very small, freely mobile echodensity on the atrial   side of the posterior mitral leaflet suggestive of vegetation   given clinical scenario. The larger abnormality in region of   anterior leaflet noted on the transthoracic study is not present   - perhaps more reflective of artifact. Mildly calcified annulus.   There was mild regurgitation. - Left atrium: The atrium was dilated. No evidence of thrombus in   the atrial cavity or appendage. Emptying velocity was normal. - Right atrium: No evidence of thrombus in the atrial cavity or   appendage. - Atrial septum: No significant defect or patent foramen ovale was   identified by color Doppler or agitated saline injection. - Tricuspid valve: There was trivial regurgitation. - Pulmonic valve: There was mild regurgitation. - Pericardium, extracardiac: There was no pericardial effusion.  Impressions:  -  Very small, freely mobile echodensity on the atrial side of the   posterior mitral leaflet suggestive of vegetation given clinical   scenario. The larger abnormality in region of anterior leaflet   noted on the transthoracic study is not present - perhaps more   reflective of artifact. There is also a small, freely mobile   echodensity seen intermittently on the ventricular side of the   right coronary aortic cusp suggestive of vegetation given   clinical scenario. No significant defect or patent foramen ovale   was identified by color Doppler or agitated saline injection  Assessment and Plan   1. Aortic stenosis - consistent with moderate AS, no symptoms. Continue to monitor.   2. CAD - no recent symptoms, continue current meds  3. HL - intolerant to statins, continue dietary modification.  LDL has been reasonable, does not require alternative therapies.   - continue to monitor at this time  4. HTN - at goal, continue currentmeds  5. Carotid stenosis - no symptoms, we will continue to monitor.   6. LE edema - increase lasix to 68m daily, check BMET/Mg in 2 weeks.      JArnoldo Lenis M.D.

## 2017-08-15 DIAGNOSIS — I330 Acute and subacute infective endocarditis: Secondary | ICD-10-CM | POA: Diagnosis not present

## 2017-08-15 DIAGNOSIS — Z5181 Encounter for therapeutic drug level monitoring: Secondary | ICD-10-CM | POA: Diagnosis not present

## 2017-08-16 ENCOUNTER — Encounter: Payer: Self-pay | Admitting: Cardiology

## 2017-08-16 ENCOUNTER — Other Ambulatory Visit: Payer: Self-pay | Admitting: Pharmacist

## 2017-08-18 DIAGNOSIS — Z5181 Encounter for therapeutic drug level monitoring: Secondary | ICD-10-CM | POA: Diagnosis not present

## 2017-08-18 DIAGNOSIS — I10 Essential (primary) hypertension: Secondary | ICD-10-CM | POA: Diagnosis not present

## 2017-08-18 DIAGNOSIS — M130 Polyarthritis, unspecified: Secondary | ICD-10-CM | POA: Diagnosis not present

## 2017-08-18 DIAGNOSIS — I330 Acute and subacute infective endocarditis: Secondary | ICD-10-CM | POA: Diagnosis not present

## 2017-08-18 DIAGNOSIS — I69398 Other sequelae of cerebral infarction: Secondary | ICD-10-CM | POA: Diagnosis not present

## 2017-08-18 DIAGNOSIS — R296 Repeated falls: Secondary | ICD-10-CM | POA: Diagnosis not present

## 2017-08-19 ENCOUNTER — Other Ambulatory Visit: Payer: Self-pay | Admitting: Pharmacist

## 2017-08-22 DIAGNOSIS — I330 Acute and subacute infective endocarditis: Secondary | ICD-10-CM | POA: Diagnosis not present

## 2017-08-22 DIAGNOSIS — Z5181 Encounter for therapeutic drug level monitoring: Secondary | ICD-10-CM | POA: Diagnosis not present

## 2017-08-23 ENCOUNTER — Telehealth: Payer: Self-pay | Admitting: Pharmacist

## 2017-08-23 NOTE — Telephone Encounter (Signed)
Patient is on vancomycin + ceftriaxone for cx negative endocarditis until 3/9. Patient has CKD at baseline and his SCr has been trending up on the vancomycin:  2/6 1.38 2/7 1.41 2/8 1.46 2/10 1.33 2/11 1.33 2/25 1.22 2/23 1.35 3/4 1.63  Per verbal from Dr. Megan Salon, called Coretta at [facility] and gave orders to stop vancomycin today but continue ceftriaxone until original end date of 3/9.  Patient follows up with Dr. Megan Salon on 3/7 so if plans change I told her we would call and let them know.

## 2017-08-25 ENCOUNTER — Ambulatory Visit: Payer: PPO | Admitting: Internal Medicine

## 2017-08-25 ENCOUNTER — Telehealth: Payer: Self-pay | Admitting: Pharmacist

## 2017-08-25 ENCOUNTER — Encounter: Payer: Self-pay | Admitting: Internal Medicine

## 2017-08-25 DIAGNOSIS — I059 Rheumatic mitral valve disease, unspecified: Secondary | ICD-10-CM

## 2017-08-25 DIAGNOSIS — I058 Other rheumatic mitral valve diseases: Secondary | ICD-10-CM | POA: Diagnosis not present

## 2017-08-25 DIAGNOSIS — Z8679 Personal history of other diseases of the circulatory system: Secondary | ICD-10-CM | POA: Insufficient documentation

## 2017-08-25 MED ORDER — CEFTRIAXONE IV (FOR PTA / DISCHARGE USE ONLY): 2.0000 g | INTRAVENOUS | 0 refills | Status: AC

## 2017-08-25 NOTE — Progress Notes (Signed)
[facility]  Reason for Consult: Culture negative endocarditis Referring Physician: Dr. Barton Dubois  Assessment: He is doing better on treatment for presumed culture-negative endocarditis complicated by embolic CVAs.  He appears to have recovered back to baseline.  He will complete 2 more days of ceftriaxone then stop and have his PICC removed.  I would recommend a follow-up transthoracic echocardiogram within the next month.  He will follow-up here in 6 weeks.  Plan: 1. 2 more days of ceftriaxone 2. Repeat BMP and have PICC pulled on 08/27/2017 3. Recommend follow-up transthoracic echocardiogram 4. Follow-up here in 6 weeks  Patient Active Problem List   Diagnosis Date Noted  . Endocarditis of mitral valve 08/25/2017    Priority: High  . Aortic valve endocarditis     Priority: High  . CVA (cerebrovascular accident) ([place]) 07/27/2017    Priority: High  . Abnormal carotid duplex scan   . Glaucoma 07/27/2017  . Closed displaced subtrochanteric fracture of left femur ([place]) 02/03/2017  . Aortic stenosis 10/19/2012  . CKD (chronic kidney disease), stage III ([place]) 10/10/2011  . Peripheral vascular disease ([place]) 10/10/2011  . Tobacco abuse, in remission   . Type 2 diabetes mellitus with complications ([place])   . Arteriosclerotic cardiovascular disease (ASCVD) 08/28/2009  . CEREBROVASCULAR DISEASE 08/28/2009  . HLD (hyperlipidemia) 12/09/2008  . Essential hypertension 12/09/2008    Patient's Medications  New Prescriptions   No medications on file  Previous Medications   ACETAMINOPHEN (TYLENOL) 325 MG TABLET    Take 650 mg by mouth every 4 (four) hours as needed.   ALBUTEROL (PROVENTIL HFA;VENTOLIN HFA) 108 (90 BASE) MCG/ACT INHALER    Inhale 2 puffs into the lungs every 4 (four) hours as needed for wheezing or shortness of breath.    ASPIRIN 81 MG TABLET    Take 81 mg by mouth 2 (two) times daily.    BETAXOLOL (BETOPTIC-S) 0.5 % OPHTHALMIC  SUSPENSION    Place 1 drop into both eyes every morning.    BIMATOPROST (LUMIGAN) 0.01 % SOLN    Place 1 drop into both eyes daily.   CALCIUM-MAGNESIUM-VITAMIN D (CALCIUM 1200+D3 PO)    Take 1 tablet by mouth daily.   CARVEDILOL (COREG) 12.5 MG TABLET    Take 12.5 mg by mouth 2 (two) times daily with a meal.    CEFTRIAXONE (ROCEPHIN) IVPB    Inject 2 g into the vein daily for 27 days. Indication:  Culture negative endocarditis Last Day of Therapy:  08/27/2017 Labs - Once weekly:  CBC/D and BMP, Labs - Every other week:  ESR and CRP   CHOLECALCIFEROL 1000 UNITS TABLET    Take 2,000 Units by mouth daily.    CRANBERRY 500 MG CAPS    Take 1 capsule by mouth daily.   DORZOLAMIDE (TRUSOPT) 2 % OPHTHALMIC SOLUTION    Place 1 drop into the left eye 2 (two) times daily.     FERROUS SULFATE (IRON) 28 MG TABS    Take 1 tablet by mouth daily.   FLUTICASONE-UMECLIDIN-VILANT (TRELEGY ELLIPTA) 100-62.5-25 MCG/INH AEPB    Inhale 1 puff into the lungs daily.   FUROSEMIDE (LASIX) 40 MG TABLET    Take 1 tablet (40 mg total) by mouth daily.   GLIPIZIDE (GLUCOTROL) 5 MG TABLET    Take 5 mg by mouth daily before breakfast.   NETARSUDIL DIMESYLATE (RHOPRESSA) 0.02 % SOLN    Apply 1 drop to eye at bedtime. Left  eye   OMEGA-3 FATTY ACIDS (FISH OIL) 1000 MG CAPS    Take 1 capsule by mouth 2 (two) times daily.   OMEPRAZOLE (PRILOSEC) 20 MG CAPSULE    Take 1 capsule by mouth daily.   PILOCARPINE (PILOCAR) 2 % OPHTHALMIC SOLUTION    Place 1 drop into the left eye 4 (four) times daily.    POTASSIUM CHLORIDE (K-DUR) 10 MEQ TABLET    Take 10 mEq by mouth daily.   SITAGLIPTIN (JANUVIA) 50 MG TABLET    Take 50 mg by mouth daily.   TAMSULOSIN (FLOMAX) 0.4 MG CAPS CAPSULE    Take 0.4 mg by mouth daily.  Modified Medications   No medications on file  Discontinued Medications   VANCOMYCIN IVPB    Inject 1,250 mg into the vein daily for 27 days. Indication:  Culture negative endocarditis Last Day of Therapy:  08/27/2017 Labs -  Sunday/Monday:  CBC/D, BMP, and vancomycin trough. Labs - Thursday:  BMP and vancomycin trough Labs - Every other week:  ESR and CRP    HPI: Anthony Murray is a 84 y.o. male who developed fever, altered mental status, headache and then had a fall at home leading to admission to [facility] on 07/27/2017.  He was found to have a temperature of 102 degrees.  MRI revealed multiple acute infarcts in different vascular territories of the right hemisphere.  TTE showed a small mobile mitral valve vegetation, calcified aortic valve and a functionally bicuspid aortic valve.  TEE showed the small mitral valve vegetation as well as a small mobile echodensity on the ventricular side of the right coronary cusp of the aortic valve.  Blood cultures were negative.  Lumbar puncture was negative.  I ordered Bartonella and Coxiella antibodies and both were negative.  I spoke to Dr. Dyann Kief by phone about the case and recommended treatment for presumed culture-negative endocarditis with vancomycin and ceftriaxone.  He improved and was discharged on 08/01/2017.  He does not have any recall of being in the hospital.  He is in today with 2 of his granddaughters.  They say that he is doing much better and back to his baseline clinical status.  He has not had any problems tolerating his PICC line.  He has had no side effects of his antibiotics other than a slight increase in his creatinine.  He has chronic kidney disease but his creatinine bumped up slightly recently to 1.63.  As a result I stopped his vancomycin 2 days ago.  He will complete 4 weeks of total antibiotic therapy on 08/27/2017.  Review of Systems: Review of Systems  Constitutional: Negative for chills, diaphoresis and fever.  Respiratory: Negative for cough, sputum production and shortness of breath.   Cardiovascular: Negative for chest pain.  Gastrointestinal: Negative for abdominal pain, diarrhea, nausea and vomiting.  Skin: Negative for rash.    Neurological: Negative for speech change and focal weakness.      Past Medical History:  Diagnosis Date  . Aortic valve stenosis   . ASCVD (arteriosclerotic cardiovascular disease)    50% ostial left left anterior desending 60% mid circumflex and normal ejection fractioon in 2006 exertional dyspnea;history of chest discomfort  . Cerebrovascular disease    minimal carotid bruits  . CKD (chronic kidney disease)   . Diabetes mellitus type 2, controlled ([place])    no insulin  . Hyperlipidemia    statin intolerant  . Hypertension   . Tobacco abuse, in remission    30 pack years  stopped in 1971    Social History   Tobacco Use  . Smoking status: Former Smoker    Packs/day: 1.00    Years: 40.00    Pack years: 40.00    Types: Cigarettes    Last attempt to quit: 10/29/1969    Years since quitting: 47.8  . Smokeless tobacco: Never Used  Substance Use Topics  . Alcohol use: No    Alcohol/week: 0.0 oz  . Drug use: No    No family history on file. Allergies  Allergen Reactions  . Codeine   . Ezetimibe-Simvastatin     REACTION: myalgias  . Naproxen     REACTION: and all nonsteroidals  . Niacin   . Pravastatin Sodium     REACTION: myalgias no    OBJECTIVE: Vitals:   08/25/17 1353  BP: (!) 165/78  Pulse: 74  Temp: 97.6 F (36.4 C)  TempSrc: Oral  SpO2: 96%  Weight: 200 lb (90.7 kg)  Height: '5\' 6"'  (1.676 m)   Body mass index is 32.28 kg/m.   Physical Exam  Constitutional:  He is pleasant and in no distress.  He defers to his granddaughters to answer most questions.  Cardiovascular: Normal rate and regular rhythm.  Murmur heard. 2/6 systolic murmur heard best at the right upper sternal border.  Pulmonary/Chest: Effort normal. He has no wheezes. He has no rales.  Abdominal: Soft. He exhibits no distension. There is no tenderness.  Neurological: He is alert.  Skin: No rash noted.  Right arm PICC site appears normal.    Microbiology: No results found for  this or any previous visit (from the past 240 hour(s)).  Michel Bickers, MD [facility] [place] [facility] [phone] pager   [phone] cell 08/25/2017, 2:31 PM

## 2017-08-25 NOTE — Telephone Encounter (Signed)
Per verbal from Dr. Megan Salon, called and spoke to [facility] and gave orders to repeat patient's BMP before the PICC line is pulled on Saturday 3/9.  Jeani Hawking verbalized understanding.

## 2017-08-26 ENCOUNTER — Other Ambulatory Visit (HOSPITAL_COMMUNITY)
Admission: RE | Admit: 2017-08-26 | Discharge: 2017-08-26 | Disposition: A | Discharge status: Home / Self Care | Payer: PPO | Source: Other Acute Inpatient Hospital | Attending: Internal Medicine | Admitting: Internal Medicine

## 2017-08-26 DIAGNOSIS — Z5181 Encounter for therapeutic drug level monitoring: Secondary | ICD-10-CM | POA: Diagnosis not present

## 2017-08-26 LAB — BASIC METABOLIC PANEL
Anion gap: 12 (ref 5–15)
BUN: 28 mg/dL — AB (ref 6–20)
CHLORIDE: 106 mmol/L (ref 101–111)
CO2: 23 mmol/L (ref 22–32)
Calcium: 8.8 mg/dL — ABNORMAL LOW (ref 8.9–10.3)
Creatinine, Ser: 1.57 mg/dL — ABNORMAL HIGH (ref 0.61–1.24)
GFR calc Af Amer: 44 mL/min — ABNORMAL LOW (ref >60)
GFR calc non Af Amer: 38 mL/min — ABNORMAL LOW (ref >60)
Glucose, Bld: 219 mg/dL — ABNORMAL HIGH (ref 65–99)
POTASSIUM: 3.6 mmol/L (ref 3.5–5.1)
Sodium: 141 mmol/L (ref 135–145)

## 2017-08-29 ENCOUNTER — Other Ambulatory Visit: Payer: Self-pay | Admitting: Pharmacist

## 2017-08-30 ENCOUNTER — Ambulatory Visit: Payer: PPO | Admitting: Urology

## 2017-08-30 DIAGNOSIS — N401 Enlarged prostate with lower urinary tract symptoms: Secondary | ICD-10-CM

## 2017-08-30 DIAGNOSIS — N3941 Urge incontinence: Secondary | ICD-10-CM | POA: Diagnosis not present

## 2017-08-31 DIAGNOSIS — M84459A Pathological fracture, hip, unspecified, initial encounter for fracture: Secondary | ICD-10-CM | POA: Diagnosis not present

## 2017-08-31 DIAGNOSIS — J449 Chronic obstructive pulmonary disease, unspecified: Secondary | ICD-10-CM | POA: Diagnosis not present

## 2017-08-31 DIAGNOSIS — N189 Chronic kidney disease, unspecified: Secondary | ICD-10-CM | POA: Diagnosis not present

## 2017-09-05 DIAGNOSIS — I350 Nonrheumatic aortic (valve) stenosis: Secondary | ICD-10-CM | POA: Diagnosis not present

## 2017-09-05 DIAGNOSIS — Z7984 Long term (current) use of oral hypoglycemic drugs: Secondary | ICD-10-CM | POA: Diagnosis not present

## 2017-09-05 DIAGNOSIS — R339 Retention of urine, unspecified: Secondary | ICD-10-CM | POA: Diagnosis not present

## 2017-09-05 DIAGNOSIS — Z6830 Body mass index (BMI) 30.0-30.9, adult: Secondary | ICD-10-CM | POA: Diagnosis not present

## 2017-09-05 DIAGNOSIS — J441 Chronic obstructive pulmonary disease with (acute) exacerbation: Secondary | ICD-10-CM | POA: Diagnosis not present

## 2017-09-05 DIAGNOSIS — M545 Low back pain: Secondary | ICD-10-CM | POA: Diagnosis not present

## 2017-09-05 DIAGNOSIS — E1151 Type 2 diabetes mellitus with diabetic peripheral angiopathy without gangrene: Secondary | ICD-10-CM | POA: Diagnosis not present

## 2017-09-05 DIAGNOSIS — I129 Hypertensive chronic kidney disease with stage 1 through stage 4 chronic kidney disease, or unspecified chronic kidney disease: Secondary | ICD-10-CM | POA: Diagnosis not present

## 2017-09-05 DIAGNOSIS — J029 Acute pharyngitis, unspecified: Secondary | ICD-10-CM | POA: Diagnosis not present

## 2017-09-05 DIAGNOSIS — S72012D Unspecified intracapsular fracture of left femur, subsequent encounter for closed fracture with routine healing: Secondary | ICD-10-CM | POA: Diagnosis not present

## 2017-09-05 DIAGNOSIS — R944 Abnormal results of kidney function studies: Secondary | ICD-10-CM | POA: Diagnosis not present

## 2017-09-05 DIAGNOSIS — S72099A Other fracture of head and neck of unspecified femur, initial encounter for closed fracture: Secondary | ICD-10-CM | POA: Diagnosis not present

## 2017-09-22 DIAGNOSIS — R944 Abnormal results of kidney function studies: Secondary | ICD-10-CM | POA: Diagnosis not present

## 2017-09-22 DIAGNOSIS — M545 Low back pain: Secondary | ICD-10-CM | POA: Diagnosis not present

## 2017-09-22 DIAGNOSIS — Z7984 Long term (current) use of oral hypoglycemic drugs: Secondary | ICD-10-CM | POA: Diagnosis not present

## 2017-09-22 DIAGNOSIS — I350 Nonrheumatic aortic (valve) stenosis: Secondary | ICD-10-CM | POA: Diagnosis not present

## 2017-09-22 DIAGNOSIS — R339 Retention of urine, unspecified: Secondary | ICD-10-CM | POA: Diagnosis not present

## 2017-09-22 DIAGNOSIS — S72012D Unspecified intracapsular fracture of left femur, subsequent encounter for closed fracture with routine healing: Secondary | ICD-10-CM | POA: Diagnosis not present

## 2017-09-22 DIAGNOSIS — S72099A Other fracture of head and neck of unspecified femur, initial encounter for closed fracture: Secondary | ICD-10-CM | POA: Diagnosis not present

## 2017-09-22 DIAGNOSIS — J441 Chronic obstructive pulmonary disease with (acute) exacerbation: Secondary | ICD-10-CM | POA: Diagnosis not present

## 2017-09-22 DIAGNOSIS — Z6830 Body mass index (BMI) 30.0-30.9, adult: Secondary | ICD-10-CM | POA: Diagnosis not present

## 2017-09-22 DIAGNOSIS — I129 Hypertensive chronic kidney disease with stage 1 through stage 4 chronic kidney disease, or unspecified chronic kidney disease: Secondary | ICD-10-CM | POA: Diagnosis not present

## 2017-09-22 DIAGNOSIS — J029 Acute pharyngitis, unspecified: Secondary | ICD-10-CM | POA: Diagnosis not present

## 2017-09-22 DIAGNOSIS — E1151 Type 2 diabetes mellitus with diabetic peripheral angiopathy without gangrene: Secondary | ICD-10-CM | POA: Diagnosis not present

## 2017-09-26 DIAGNOSIS — E1122 Type 2 diabetes mellitus with diabetic chronic kidney disease: Secondary | ICD-10-CM | POA: Diagnosis not present

## 2017-09-26 DIAGNOSIS — I10 Essential (primary) hypertension: Secondary | ICD-10-CM | POA: Diagnosis not present

## 2017-09-26 DIAGNOSIS — R600 Localized edema: Secondary | ICD-10-CM | POA: Diagnosis not present

## 2017-09-26 DIAGNOSIS — F339 Major depressive disorder, recurrent, unspecified: Secondary | ICD-10-CM | POA: Diagnosis not present

## 2017-09-26 DIAGNOSIS — N183 Chronic kidney disease, stage 3 (moderate): Secondary | ICD-10-CM | POA: Diagnosis not present

## 2017-09-26 DIAGNOSIS — N400 Enlarged prostate without lower urinary tract symptoms: Secondary | ICD-10-CM | POA: Diagnosis not present

## 2017-09-26 DIAGNOSIS — M19049 Primary osteoarthritis, unspecified hand: Secondary | ICD-10-CM | POA: Diagnosis not present

## 2017-09-26 DIAGNOSIS — E782 Mixed hyperlipidemia: Secondary | ICD-10-CM | POA: Diagnosis not present

## 2017-09-26 DIAGNOSIS — R3911 Hesitancy of micturition: Secondary | ICD-10-CM | POA: Diagnosis not present

## 2017-09-26 DIAGNOSIS — H409 Unspecified glaucoma: Secondary | ICD-10-CM | POA: Diagnosis not present

## 2017-09-26 DIAGNOSIS — J441 Chronic obstructive pulmonary disease with (acute) exacerbation: Secondary | ICD-10-CM | POA: Diagnosis not present

## 2017-09-26 DIAGNOSIS — I2510 Atherosclerotic heart disease of native coronary artery without angina pectoris: Secondary | ICD-10-CM | POA: Diagnosis not present

## 2017-09-29 ENCOUNTER — Other Ambulatory Visit: Payer: Self-pay

## 2017-09-29 ENCOUNTER — Inpatient Hospital Stay (HOSPITAL_COMMUNITY)
Admission: EM | Admit: 2017-09-29 | Discharge: 2017-10-03 | DRG: 378 | Disposition: A | Discharge status: Home Health | Payer: PPO | Attending: Internal Medicine | Admitting: Internal Medicine

## 2017-09-29 ENCOUNTER — Encounter (HOSPITAL_COMMUNITY): Payer: Self-pay

## 2017-09-29 DIAGNOSIS — N189 Chronic kidney disease, unspecified: Secondary | ICD-10-CM | POA: Diagnosis not present

## 2017-09-29 DIAGNOSIS — Z7984 Long term (current) use of oral hypoglycemic drugs: Secondary | ICD-10-CM

## 2017-09-29 DIAGNOSIS — I350 Nonrheumatic aortic (valve) stenosis: Secondary | ICD-10-CM | POA: Diagnosis present

## 2017-09-29 DIAGNOSIS — I10 Essential (primary) hypertension: Secondary | ICD-10-CM | POA: Diagnosis not present

## 2017-09-29 DIAGNOSIS — K921 Melena: Secondary | ICD-10-CM | POA: Diagnosis not present

## 2017-09-29 DIAGNOSIS — Z86010 Personal history of colonic polyps: Secondary | ICD-10-CM

## 2017-09-29 DIAGNOSIS — M84459A Pathological fracture, hip, unspecified, initial encounter for fracture: Secondary | ICD-10-CM | POA: Diagnosis not present

## 2017-09-29 DIAGNOSIS — I129 Hypertensive chronic kidney disease with stage 1 through stage 4 chronic kidney disease, or unspecified chronic kidney disease: Secondary | ICD-10-CM | POA: Diagnosis present

## 2017-09-29 DIAGNOSIS — E1122 Type 2 diabetes mellitus with diabetic chronic kidney disease: Secondary | ICD-10-CM | POA: Diagnosis present

## 2017-09-29 DIAGNOSIS — E1151 Type 2 diabetes mellitus with diabetic peripheral angiopathy without gangrene: Secondary | ICD-10-CM | POA: Diagnosis present

## 2017-09-29 DIAGNOSIS — I679 Cerebrovascular disease, unspecified: Secondary | ICD-10-CM

## 2017-09-29 DIAGNOSIS — K710 Toxic liver disease with cholestasis: Secondary | ICD-10-CM | POA: Diagnosis not present

## 2017-09-29 DIAGNOSIS — K219 Gastro-esophageal reflux disease without esophagitis: Secondary | ICD-10-CM | POA: Diagnosis present

## 2017-09-29 DIAGNOSIS — J449 Chronic obstructive pulmonary disease, unspecified: Secondary | ICD-10-CM | POA: Diagnosis present

## 2017-09-29 DIAGNOSIS — I2510 Atherosclerotic heart disease of native coronary artery without angina pectoris: Secondary | ICD-10-CM | POA: Diagnosis present

## 2017-09-29 DIAGNOSIS — Z87891 Personal history of nicotine dependence: Secondary | ICD-10-CM

## 2017-09-29 DIAGNOSIS — N183 Chronic kidney disease, stage 3 unspecified: Secondary | ICD-10-CM | POA: Diagnosis present

## 2017-09-29 DIAGNOSIS — N400 Enlarged prostate without lower urinary tract symptoms: Secondary | ICD-10-CM | POA: Diagnosis present

## 2017-09-29 DIAGNOSIS — R5383 Other fatigue: Secondary | ICD-10-CM | POA: Diagnosis not present

## 2017-09-29 DIAGNOSIS — E118 Type 2 diabetes mellitus with unspecified complications: Secondary | ICD-10-CM | POA: Diagnosis present

## 2017-09-29 DIAGNOSIS — D62 Acute posthemorrhagic anemia: Secondary | ICD-10-CM | POA: Diagnosis present

## 2017-09-29 DIAGNOSIS — E785 Hyperlipidemia, unspecified: Secondary | ICD-10-CM | POA: Diagnosis present

## 2017-09-29 DIAGNOSIS — Z933 Colostomy status: Secondary | ICD-10-CM

## 2017-09-29 DIAGNOSIS — K5731 Diverticulosis of large intestine without perforation or abscess with bleeding: Principal | ICD-10-CM | POA: Diagnosis present

## 2017-09-29 DIAGNOSIS — Z8719 Personal history of other diseases of the digestive system: Secondary | ICD-10-CM

## 2017-09-29 DIAGNOSIS — K625 Hemorrhage of anus and rectum: Secondary | ICD-10-CM | POA: Diagnosis not present

## 2017-09-29 DIAGNOSIS — K922 Gastrointestinal hemorrhage, unspecified: Secondary | ICD-10-CM

## 2017-09-29 DIAGNOSIS — K5909 Other constipation: Secondary | ICD-10-CM | POA: Diagnosis present

## 2017-09-29 DIAGNOSIS — Z8673 Personal history of transient ischemic attack (TIA), and cerebral infarction without residual deficits: Secondary | ICD-10-CM | POA: Diagnosis not present

## 2017-09-29 DIAGNOSIS — Z7982 Long term (current) use of aspirin: Secondary | ICD-10-CM

## 2017-09-29 DIAGNOSIS — F17201 Nicotine dependence, unspecified, in remission: Secondary | ICD-10-CM

## 2017-09-29 DIAGNOSIS — R42 Dizziness and giddiness: Secondary | ICD-10-CM | POA: Diagnosis not present

## 2017-09-29 DIAGNOSIS — I739 Peripheral vascular disease, unspecified: Secondary | ICD-10-CM | POA: Diagnosis present

## 2017-09-29 LAB — I-STAT CHEM 8, ED
BUN: 28 mg/dL — AB (ref 6–20)
CHLORIDE: 105 mmol/L (ref 101–111)
Calcium, Ion: 1.13 mmol/L — ABNORMAL LOW (ref 1.15–1.40)
Creatinine, Ser: 1.8 mg/dL — ABNORMAL HIGH (ref 0.61–1.24)
Glucose, Bld: 204 mg/dL — ABNORMAL HIGH (ref 65–99)
HCT: 41 % (ref 39.0–52.0)
Hemoglobin: 13.9 g/dL (ref 13.0–17.0)
POTASSIUM: 3.7 mmol/L (ref 3.5–5.1)
SODIUM: 139 mmol/L (ref 135–145)
TCO2: 25 mmol/L (ref 22–32)

## 2017-09-29 LAB — I-STAT TROPONIN, ED: TROPONIN I, POC: 0.01 ng/mL (ref 0.00–0.08)

## 2017-09-29 LAB — ABO/RH: ABO/RH(D): O POS

## 2017-09-29 LAB — TYPE AND SCREEN
ABO/RH(D): O POS
ANTIBODY SCREEN: NEGATIVE

## 2017-09-29 LAB — POC OCCULT BLOOD, ED: FECAL OCCULT BLD: POSITIVE — AB

## 2017-09-29 MED ORDER — SODIUM CHLORIDE 0.9 % IV BOLUS
500.0000 mL | Freq: Once | INTRAVENOUS | Status: AC
  Administered 2017-09-29: 500 mL via INTRAVENOUS

## 2017-09-29 NOTE — ED Provider Notes (Signed)
[facility] [place] EMERGENCY DEPARTMENT Provider Note   CSN: [id number] Arrival date & time: 09/29/17  2140     History   Chief Complaint Chief Complaint  Patient presents with  . GI Bleeding    HPI Anthony Murray is a 84 y.o. male.  The history is provided by the patient and medical records. No language interpreter was used.  Rectal Bleeding  Quality:  Maroon Amount:  Copious Duration:  1 day Timing:  Constant Chronicity:  New Context: not anal fissures, not constipation, not diarrhea and not hemorrhoids   Similar prior episodes: no   Relieved by:  Nothing Worsened by:  Nothing Ineffective treatments:  None tried Associated symptoms: abdominal pain (resolved) and light-headedness   Associated symptoms: no dizziness, no fever, no hematemesis, no loss of consciousness, no recent illness and no vomiting     Past Medical History:  Diagnosis Date  . Aortic valve stenosis   . ASCVD (arteriosclerotic cardiovascular disease)    50% ostial left left anterior desending 60% mid circumflex and normal ejection fractioon in 2006 exertional dyspnea;history of chest discomfort  . Cerebrovascular disease    minimal carotid bruits  . CKD (chronic kidney disease)   . Diabetes mellitus type 2, controlled ([place])    no insulin  . Hyperlipidemia    statin intolerant  . Hypertension   . Tobacco abuse, in remission    30 pack years stopped in 1971    Patient Active Problem List   Diagnosis Date Noted  . Endocarditis of mitral valve 08/25/2017  . Abnormal carotid duplex scan   . Aortic valve endocarditis   . CVA (cerebrovascular accident) ([place]) 07/27/2017  . Glaucoma 07/27/2017  . Closed displaced subtrochanteric fracture of left femur ([place]) 02/03/2017  . Aortic stenosis 10/19/2012  . CKD (chronic kidney disease), stage III ([place]) 10/10/2011  . Peripheral vascular disease ([place]) 10/10/2011  . Tobacco abuse, in remission   . Type 2 diabetes mellitus with complications  ([place])   . Arteriosclerotic cardiovascular disease (ASCVD) 08/28/2009  . CEREBROVASCULAR DISEASE 08/28/2009  . HLD (hyperlipidemia) 12/09/2008  . Essential hypertension 12/09/2008    Past Surgical History:  Procedure Laterality Date  . APPENDECTOMY    . CARPAL TUNNEL RELEASE     surgery left 05/05/2001  . KNEE ARTHROSCOPY     bilaterial; unknown associated surgical procedure  . SHOULDER SURGERY     Right x2; third procedure on left  . TEE WITHOUT CARDIOVERSION N/A 07/29/2017   Procedure: TRANSESOPHAGEAL ECHOCARDIOGRAM (TEE) WITH PROPOFOL;  Surgeon: Satira Sark, MD;  Location: [facility];  Service: Cardiovascular;  Laterality: N/A;  . TONSILLECTOMY    . TRANSURETHRAL INCISION OF PROSTATE     resection of the prostate        Home Medications    Prior to Admission medications   Medication Sig Start Date End Date Taking? Authorizing Provider  acetaminophen (TYLENOL) 325 MG tablet Take 650 mg by mouth every 4 (four) hours as needed.    [provider]  albuterol (PROVENTIL HFA;VENTOLIN HFA) 108 (90 Base) MCG/ACT inhaler Inhale 2 puffs into the lungs every 4 (four) hours as needed for wheezing or shortness of breath.     [provider]  aspirin 81 MG tablet Take 81 mg by mouth 2 (two) times daily.     [provider]  betaxolol (BETOPTIC-S) 0.5 % ophthalmic suspension Place 1 drop into both eyes every morning.  12/15/15   [provider]  bimatoprost (LUMIGAN) 0.01 %  SOLN Place 1 drop into both eyes daily.    [provider]  Calcium-Magnesium-Vitamin D (CALCIUM 1200+D3 PO) Take 1 tablet by mouth daily.    [provider]  carvedilol (COREG) 12.5 MG tablet Take 12.5 mg by mouth 2 (two) times daily with a meal.  09/28/12   [provider]  Cholecalciferol 1000 units tablet Take 2,000 Units by mouth daily.     [provider]  Cranberry 500 MG CAPS Take 1 capsule by mouth daily.    [provider]    dorzolamide (TRUSOPT) 2 % ophthalmic solution Place 1 drop into the left eye 2 (two) times daily.      [provider]  Ferrous Sulfate (IRON) 28 MG TABS Take 1 tablet by mouth daily.    [provider]  Fluticasone-Umeclidin-Vilant (TRELEGY ELLIPTA) 100-62.5-25 MCG/INH AEPB Inhale 1 puff into the lungs daily.    [provider]  furosemide (LASIX) 40 MG tablet Take 1 tablet (40 mg total) by mouth daily. 08/12/17 11/10/17  [name], MD  glipiZIDE (GLUCOTROL) 5 MG tablet Take 5 mg by mouth daily before breakfast.    [provider]  Netarsudil Dimesylate (RHOPRESSA) 0.02 % SOLN Apply 1 drop to eye at bedtime. Left eye    [provider]  Omega-3 Fatty Acids (FISH OIL) 1000 MG CAPS Take 1 capsule by mouth 2 (two) times daily.    [provider]  omeprazole (PRILOSEC) 20 MG capsule Take 1 capsule by mouth daily. 08/08/17   [provider]  pilocarpine (PILOCAR) 2 % ophthalmic solution Place 1 drop into the left eye 4 (four) times daily.     [provider]  potassium chloride (K-DUR) 10 MEQ tablet Take 10 mEq by mouth daily.    [provider]  sitaGLIPtin (JANUVIA) 50 MG tablet Take 50 mg by mouth daily.    [provider]  tamsulosin (FLOMAX) 0.4 MG CAPS capsule Take 0.4 mg by mouth daily.    [provider]    Family History No family history on file.  Social History Social History   Tobacco Use  . Smoking status: Former Smoker    Packs/day: 1.00    Years: 40.00    Pack years: 40.00    Types: Cigarettes    Last attempt to quit: 10/29/1969    Years since quitting: 47.9  . Smokeless tobacco: Never Used  Substance Use Topics  . Alcohol use: No    Alcohol/week: 0.0 oz  . Drug use: No     Allergies   Codeine; Ezetimibe-simvastatin; Naproxen; Niacin; and Pravastatin sodium   Review of Systems Review of Systems  Constitutional: Positive for fatigue. Negative for chills,  diaphoresis and fever.  Respiratory: Negative for cough, chest tightness, shortness of breath and wheezing.   Cardiovascular: Negative for chest pain, palpitations and leg swelling.  Gastrointestinal: Positive for abdominal pain (resolved) and hematochezia. Negative for constipation, diarrhea, hematemesis, nausea and vomiting.  Genitourinary: Negative for dysuria and flank pain.  Musculoskeletal: Negative for back pain, neck pain and neck stiffness.  Neurological: Positive for light-headedness. Negative for dizziness, loss of consciousness, weakness and headaches.  Psychiatric/Behavioral: Negative for agitation.  All other systems reviewed and are negative.    Physical Exam Updated Vital Signs There were no vitals taken for this visit.  Physical Exam  Constitutional: He is oriented to person, place, and time. He appears well-developed and well-nourished.  HENT:  Head: Normocephalic and atraumatic.  Mouth/Throat: Oropharynx is clear and  moist. No oropharyngeal exudate.  Eyes: Pupils are equal, round, and reactive to light. Conjunctivae and EOM are normal.  Neck: Normal range of motion. Neck supple.  Cardiovascular: Normal rate and regular rhythm.  No murmur heard. Pulmonary/Chest: Effort normal and breath sounds normal. No respiratory distress.  Abdominal: Soft. He exhibits no distension. There is no tenderness.  Genitourinary: Rectal exam shows guaiac positive stool.  Musculoskeletal: He exhibits no edema or tenderness.  Neurological: He is alert and oriented to person, place, and time. No sensory deficit. He exhibits normal muscle tone.  Skin: Skin is warm and dry. Capillary refill takes less than 2 seconds. No erythema. There is pallor.  Psychiatric: He has a normal mood and affect.  Nursing note and vitals reviewed.    ED Treatments / Results  Labs (all labs ordered are listed, but only abnormal results are displayed) Labs Reviewed  CBC WITH DIFFERENTIAL/PLATELET -  Abnormal; Notable for the following components:      Result Value   RDW 15.8 (*)    All other components within normal limits  COMPREHENSIVE METABOLIC PANEL - Abnormal; Notable for the following components:   Glucose, Bld 195 (*)    BUN 27 (*)    Creatinine, Ser 1.80 (*)    Calcium 8.6 (*)    Total Protein 6.4 (*)    GFR calc non Af Amer 32 (*)    GFR calc Af Amer 37 (*)    All other components within normal limits  POC OCCULT BLOOD, ED - Abnormal; Notable for the following components:   Fecal Occult Bld POSITIVE (*)    All other components within normal limits  I-STAT CHEM 8, ED - Abnormal; Notable for the following components:   BUN 28 (*)    Creatinine, Ser 1.80 (*)    Glucose, Bld 204 (*)    Calcium, Ion 1.13 (*)    All other components within normal limits  PROTIME-INR  I-STAT TROPONIN, ED  TYPE AND SCREEN  ABO/RH    EKG None  Radiology No results found.  Procedures Procedures (including critical care time)  Medications Ordered in ED Medications  sodium chloride 0.9 % bolus 500 mL (0 mLs Intravenous Stopped 09/30/17 0028)     Initial Impression / Assessment and Plan / ED Course  I have reviewed the triage vital signs and the nursing notes.  Pertinent labs & imaging results that were available during my care of the patient were reviewed by me and considered in my medical decision making (see chart for details).     Anthony Murray is a 84 y.o. male with a past medical history significant for CAD, hypertension, hyper lipidemia, diabetes, CKD, and aortic stenosis who presents with lightheadedness, mild abdominal cramping, and rectal bleeding.  Patient reports he is never had GI bleeding in the past.  He does report taking aspirin no other blood thinners.  He says that several hours ago he realized he needed to have a bowel movement and when he went to the toilet he started having dark gross blood.  He reported having some abdominal cramping but that has resolved.   He reports feeling lightheaded.  On arrival patient is tachycardic but is not tachypnea.  He denies any chest pain, shortness of breath, palpitations.  He denies any back pain, flank pain, or recent injuries.  He denies any history of internal or external hemorrhoids.  He denies other complaints on arrival.  On exam, patient had gross blood on rectal exam.  It  was dark blood.  It appeared to be melanotic.  Patient had no hemorrhoids appreciated.  Rectal exam was nontender.  Abdomen was nontender.  Lungs clear and chest nontender.  Patient had a murmur present.  Patient was slightly tachycardic.     given patient's lack of history of GI bleeding I am concerned patient has an upper GI bleed causing his symptoms.  Patient was placed on telemetry and given a 500 cc bolus of fluids.  Although patient denies history of CHF, he is on Lasix.  Will be gentle with rehydration.  Anticipate reassessment after lab testing.  I-STAT testing shows hemoglobin of 13.9.    Patient will have a repeat hemoglobin in several hours to determine if he is downtrending.  Patient has worsening hemoglobin or worsening kidney function, patient will likely require admission for further management of new GI bleed causing lightheadedness.       Care tranasferred to Dr. Stark Jock while awaiting repeat Hgb. Anticipate admission.   Final Clinical Impressions(s) / ED Diagnoses   Final diagnoses:  Acute GI bleeding     Clinical Impression: 1. Acute GI bleeding     Disposition: Care transferred to dr. Stark Jock      Isaack Preble, Gwenyth Allegra, MD 09/30/17 0200

## 2017-09-29 NOTE — ED Triage Notes (Signed)
Per ems pt was at home and pt had just eaten when he felt he needed to go to the bathroom and noticed the toilet was full of blood. Pt is alert oriented x 4. Pt reported this has not happen before. Pt stated he had never had hemorrhoids. Pt reports he was taken off iron on Monday. 153/80,  98 hr, 20 rr, 96 ra, cbg 185.

## 2017-09-30 DIAGNOSIS — K922 Gastrointestinal hemorrhage, unspecified: Secondary | ICD-10-CM | POA: Diagnosis present

## 2017-09-30 DIAGNOSIS — E785 Hyperlipidemia, unspecified: Secondary | ICD-10-CM | POA: Diagnosis present

## 2017-09-30 DIAGNOSIS — K625 Hemorrhage of anus and rectum: Secondary | ICD-10-CM | POA: Diagnosis not present

## 2017-09-30 DIAGNOSIS — K710 Toxic liver disease with cholestasis: Secondary | ICD-10-CM | POA: Diagnosis not present

## 2017-09-30 DIAGNOSIS — K921 Melena: Secondary | ICD-10-CM | POA: Diagnosis not present

## 2017-09-30 DIAGNOSIS — E1151 Type 2 diabetes mellitus with diabetic peripheral angiopathy without gangrene: Secondary | ICD-10-CM | POA: Diagnosis present

## 2017-09-30 DIAGNOSIS — Z8673 Personal history of transient ischemic attack (TIA), and cerebral infarction without residual deficits: Secondary | ICD-10-CM | POA: Diagnosis not present

## 2017-09-30 DIAGNOSIS — K219 Gastro-esophageal reflux disease without esophagitis: Secondary | ICD-10-CM | POA: Diagnosis present

## 2017-09-30 DIAGNOSIS — I129 Hypertensive chronic kidney disease with stage 1 through stage 4 chronic kidney disease, or unspecified chronic kidney disease: Secondary | ICD-10-CM | POA: Diagnosis present

## 2017-09-30 DIAGNOSIS — Z7984 Long term (current) use of oral hypoglycemic drugs: Secondary | ICD-10-CM | POA: Diagnosis not present

## 2017-09-30 DIAGNOSIS — K5731 Diverticulosis of large intestine without perforation or abscess with bleeding: Secondary | ICD-10-CM | POA: Diagnosis present

## 2017-09-30 DIAGNOSIS — N183 Chronic kidney disease, stage 3 (moderate): Secondary | ICD-10-CM | POA: Diagnosis present

## 2017-09-30 DIAGNOSIS — I10 Essential (primary) hypertension: Secondary | ICD-10-CM | POA: Diagnosis not present

## 2017-09-30 DIAGNOSIS — Z8719 Personal history of other diseases of the digestive system: Secondary | ICD-10-CM

## 2017-09-30 DIAGNOSIS — J449 Chronic obstructive pulmonary disease, unspecified: Secondary | ICD-10-CM | POA: Diagnosis present

## 2017-09-30 DIAGNOSIS — I350 Nonrheumatic aortic (valve) stenosis: Secondary | ICD-10-CM | POA: Diagnosis present

## 2017-09-30 DIAGNOSIS — N400 Enlarged prostate without lower urinary tract symptoms: Secondary | ICD-10-CM | POA: Diagnosis present

## 2017-09-30 DIAGNOSIS — Z933 Colostomy status: Secondary | ICD-10-CM | POA: Diagnosis not present

## 2017-09-30 DIAGNOSIS — I679 Cerebrovascular disease, unspecified: Secondary | ICD-10-CM | POA: Diagnosis not present

## 2017-09-30 DIAGNOSIS — K5909 Other constipation: Secondary | ICD-10-CM | POA: Diagnosis present

## 2017-09-30 DIAGNOSIS — I2510 Atherosclerotic heart disease of native coronary artery without angina pectoris: Secondary | ICD-10-CM | POA: Diagnosis present

## 2017-09-30 DIAGNOSIS — Z86010 Personal history of colonic polyps: Secondary | ICD-10-CM

## 2017-09-30 DIAGNOSIS — Z7982 Long term (current) use of aspirin: Secondary | ICD-10-CM | POA: Diagnosis not present

## 2017-09-30 DIAGNOSIS — E1122 Type 2 diabetes mellitus with diabetic chronic kidney disease: Secondary | ICD-10-CM | POA: Diagnosis present

## 2017-09-30 DIAGNOSIS — Z87891 Personal history of nicotine dependence: Secondary | ICD-10-CM | POA: Diagnosis not present

## 2017-09-30 DIAGNOSIS — D62 Acute posthemorrhagic anemia: Secondary | ICD-10-CM | POA: Diagnosis present

## 2017-09-30 LAB — CBG MONITORING, ED
GLUCOSE-CAPILLARY: 160 mg/dL — AB (ref 65–99)
Glucose-Capillary: 105 mg/dL — ABNORMAL HIGH (ref 65–99)
Glucose-Capillary: 98 mg/dL (ref 65–99)

## 2017-09-30 LAB — CBC
HCT: 36.5 % — ABNORMAL LOW (ref 39.0–52.0)
HEMATOCRIT: 31.8 % — AB (ref 39.0–52.0)
HEMOGLOBIN: 11.9 g/dL — AB (ref 13.0–17.0)
HEMOGLOBIN: 10.3 g/dL — AB (ref 13.0–17.0)
MCH: 30 pg (ref 26.0–34.0)
MCH: 29.7 pg (ref 26.0–34.0)
MCHC: 32.6 g/dL (ref 30.0–36.0)
MCHC: 32.4 g/dL (ref 30.0–36.0)
MCV: 91.9 fL (ref 78.0–100.0)
MCV: 91.6 fL (ref 78.0–100.0)
Platelets: 155 10*3/uL (ref 150–400)
Platelets: 152 10*3/uL (ref 150–400)
RBC: 3.97 MIL/uL — AB (ref 4.22–5.81)
RBC: 3.47 MIL/uL — ABNORMAL LOW (ref 4.22–5.81)
RDW: 15.8 % — ABNORMAL HIGH (ref 11.5–15.5)
RDW: 15.7 % — AB (ref 11.5–15.5)
WBC: 6.3 10*3/uL (ref 4.0–10.5)
WBC: 8.1 10*3/uL (ref 4.0–10.5)

## 2017-09-30 LAB — BASIC METABOLIC PANEL
ANION GAP: 10 (ref 5–15)
BUN: 30 mg/dL — ABNORMAL HIGH (ref 6–20)
CALCIUM: 8.2 mg/dL — AB (ref 8.9–10.3)
CHLORIDE: 106 mmol/L (ref 101–111)
CO2: 23 mmol/L (ref 22–32)
Creatinine, Ser: 1.71 mg/dL — ABNORMAL HIGH (ref 0.61–1.24)
GFR calc non Af Amer: 34 mL/min — ABNORMAL LOW (ref >60)
GFR, EST AFRICAN AMERICAN: 39 mL/min — AB (ref >60)
Glucose, Bld: 173 mg/dL — ABNORMAL HIGH (ref 65–99)
POTASSIUM: 3.9 mmol/L (ref 3.5–5.1)
Sodium: 139 mmol/L (ref 135–145)

## 2017-09-30 LAB — HEMOGLOBIN AND HEMATOCRIT, BLOOD
HEMATOCRIT: 33.1 % — AB (ref 39.0–52.0)
HEMOGLOBIN: 10.5 g/dL — AB (ref 13.0–17.0)

## 2017-09-30 LAB — CBC WITH DIFFERENTIAL/PLATELET
BASOS ABS: 0 10*3/uL (ref 0.0–0.1)
BASOS PCT: 0 %
Eosinophils Absolute: 0.2 10*3/uL (ref 0.0–0.7)
Eosinophils Relative: 2 %
HEMATOCRIT: 42.4 % (ref 39.0–52.0)
Hemoglobin: 13.8 g/dL (ref 13.0–17.0)
Lymphocytes Relative: 16 %
Lymphs Abs: 1.3 10*3/uL (ref 0.7–4.0)
MCH: 30.1 pg (ref 26.0–34.0)
MCHC: 32.5 g/dL (ref 30.0–36.0)
MCV: 92.4 fL (ref 78.0–100.0)
Monocytes Absolute: 0.5 10*3/uL (ref 0.1–1.0)
Monocytes Relative: 6 %
NEUTROS ABS: 6.2 10*3/uL (ref 1.7–7.7)
NEUTROS PCT: 76 %
Platelets: 170 10*3/uL (ref 150–400)
RBC: 4.59 MIL/uL (ref 4.22–5.81)
RDW: 15.8 % — AB (ref 11.5–15.5)
WBC: 8.1 10*3/uL (ref 4.0–10.5)

## 2017-09-30 LAB — COMPREHENSIVE METABOLIC PANEL
ALBUMIN: 3.5 g/dL (ref 3.5–5.0)
ALK PHOS: 63 U/L (ref 38–126)
ALT: 27 U/L (ref 17–63)
AST: 22 U/L (ref 15–41)
Anion gap: 12 (ref 5–15)
BILIRUBIN TOTAL: 0.7 mg/dL (ref 0.3–1.2)
BUN: 27 mg/dL — AB (ref 6–20)
CO2: 23 mmol/L (ref 22–32)
Calcium: 8.6 mg/dL — ABNORMAL LOW (ref 8.9–10.3)
Chloride: 103 mmol/L (ref 101–111)
Creatinine, Ser: 1.8 mg/dL — ABNORMAL HIGH (ref 0.61–1.24)
GFR calc Af Amer: 37 mL/min — ABNORMAL LOW (ref >60)
GFR calc non Af Amer: 32 mL/min — ABNORMAL LOW (ref >60)
GLUCOSE: 195 mg/dL — AB (ref 65–99)
POTASSIUM: 3.7 mmol/L (ref 3.5–5.1)
Sodium: 138 mmol/L (ref 135–145)
TOTAL PROTEIN: 6.4 g/dL — AB (ref 6.5–8.1)

## 2017-09-30 LAB — PROTIME-INR
INR: 1.03
Prothrombin Time: 13.4 seconds (ref 11.4–15.2)

## 2017-09-30 LAB — GLUCOSE, CAPILLARY
Glucose-Capillary: 152 mg/dL — ABNORMAL HIGH (ref 65–99)
Glucose-Capillary: 194 mg/dL — ABNORMAL HIGH (ref 65–99)
Glucose-Capillary: 259 mg/dL — ABNORMAL HIGH (ref 65–99)

## 2017-09-30 MED ORDER — FLUTICASONE-UMECLIDIN-VILANT 100-62.5-25 MCG/INH IN AEPB: 1.0000 | INHALATION_SPRAY | Freq: Every day | RESPIRATORY_TRACT | Status: DC

## 2017-09-30 MED ORDER — LINAGLIPTIN 5 MG PO TABS: 5.0000 mg | ORAL_TABLET | Freq: Every day | ORAL | Status: DC

## 2017-09-30 MED ORDER — PEG-KCL-NACL-NASULF-NA ASC-C 100 G PO SOLR: 1.0000 | Freq: Once | ORAL | Status: DC

## 2017-09-30 MED ORDER — SODIUM CHLORIDE 0.9 % IV SOLN
INTRAVENOUS | Status: DC
  Administered 2017-09-30 – 2017-10-03 (×3): via INTRAVENOUS

## 2017-09-30 MED ORDER — ONDANSETRON HCL 4 MG PO TABS: 4.0000 mg | ORAL_TABLET | Freq: Four times a day (QID) | ORAL | Status: DC | PRN

## 2017-09-30 MED ORDER — SODIUM CHLORIDE 0.9 % IV SOLN
0.3000 ug/kg | Freq: Once | INTRAVENOUS | Status: AC
  Administered 2017-09-30: 26.8 ug via INTRAVENOUS
  Filled 2017-09-30 (×2): qty 6.7

## 2017-09-30 MED ORDER — UMECLIDINIUM BROMIDE 62.5 MCG/INH IN AEPB
1.0000 | INHALATION_SPRAY | Freq: Every day | RESPIRATORY_TRACT | Status: DC
  Administered 2017-09-30 – 2017-10-03 (×4): 1 via RESPIRATORY_TRACT
  Filled 2017-09-30: qty 7

## 2017-09-30 MED ORDER — INSULIN ASPART 100 UNIT/ML ~~LOC~~ SOLN: 0.0000 [IU] | Freq: Every day | SUBCUTANEOUS | Status: DC

## 2017-09-30 MED ORDER — PEG-KCL-NACL-NASULF-NA ASC-C 100 G PO SOLR
0.5000 | Freq: Once | ORAL | Status: AC
  Administered 2017-09-30: 19:00:00 via ORAL
  Filled 2017-09-30 (×2): qty 1

## 2017-09-30 MED ORDER — INSULIN ASPART 100 UNIT/ML ~~LOC~~ SOLN
0.0000 [IU] | SUBCUTANEOUS | Status: DC
  Administered 2017-09-30 (×2): 3 [IU] via SUBCUTANEOUS
  Administered 2017-09-30: 8 [IU] via SUBCUTANEOUS
  Administered 2017-10-01 (×2): 3 [IU] via SUBCUTANEOUS
  Administered 2017-10-01 (×2): 2 [IU] via SUBCUTANEOUS
  Administered 2017-10-02: 3 [IU] via SUBCUTANEOUS
  Administered 2017-10-02 (×3): 2 [IU] via SUBCUTANEOUS
  Filled 2017-09-30 (×2): qty 1

## 2017-09-30 MED ORDER — GLIPIZIDE ER 5 MG PO TB24: 5.0000 mg | ORAL_TABLET | Freq: Two times a day (BID) | ORAL | Status: DC

## 2017-09-30 MED ORDER — PEG-KCL-NACL-NASULF-NA ASC-C 100 G PO SOLR
1.0000 | Freq: Once | ORAL | Status: AC
  Administered 2017-10-01: 200 g via ORAL
  Filled 2017-09-30: qty 1

## 2017-09-30 MED ORDER — OXYBUTYNIN CHLORIDE 5 MG PO TABS
5.0000 mg | ORAL_TABLET | Freq: Two times a day (BID) | ORAL | Status: DC
  Administered 2017-09-30 – 2017-10-03 (×7): 5 mg via ORAL
  Filled 2017-09-30 (×8): qty 1

## 2017-09-30 MED ORDER — TAMSULOSIN HCL 0.4 MG PO CAPS
0.4000 mg | ORAL_CAPSULE | Freq: Every day | ORAL | Status: DC
  Administered 2017-09-30 – 2017-10-03 (×4): 0.4 mg via ORAL
  Filled 2017-09-30 (×4): qty 1

## 2017-09-30 MED ORDER — NETARSUDIL DIMESYLATE 0.02 % OP SOLN
1.0000 [drp] | Freq: Every day | OPHTHALMIC | Status: DC
Start: 2017-09-30 — End: 2017-10-02

## 2017-09-30 MED ORDER — PILOCARPINE HCL 2 % OP SOLN
1.0000 [drp] | Freq: Four times a day (QID) | OPHTHALMIC | Status: DC
  Administered 2017-09-30 – 2017-10-03 (×12): 1 [drp] via OPHTHALMIC
  Filled 2017-09-30: qty 15

## 2017-09-30 MED ORDER — CARVEDILOL 12.5 MG PO TABS
12.5000 mg | ORAL_TABLET | Freq: Two times a day (BID) | ORAL | Status: DC
Start: 2017-09-30 — End: 2017-10-03
  Administered 2017-09-30 – 2017-10-03 (×7): 12.5 mg via ORAL
  Filled 2017-09-30 (×7): qty 1

## 2017-09-30 MED ORDER — PANTOPRAZOLE SODIUM 40 MG IV SOLR
40.0000 mg | Freq: Two times a day (BID) | INTRAVENOUS | Status: DC
  Administered 2017-09-30 – 2017-10-01 (×3): 40 mg via INTRAVENOUS
  Filled 2017-09-30 (×3): qty 40

## 2017-09-30 MED ORDER — FLUTICASONE FUROATE-VILANTEROL 100-25 MCG/INH IN AEPB
1.0000 | INHALATION_SPRAY | Freq: Every day | RESPIRATORY_TRACT | Status: DC
Start: 2017-09-30 — End: 2017-10-03
  Administered 2017-09-30 – 2017-10-03 (×4): 1 via RESPIRATORY_TRACT
  Filled 2017-09-30: qty 28

## 2017-09-30 MED ORDER — ONDANSETRON HCL 4 MG/2ML IJ SOLN: 4.0000 mg | Freq: Four times a day (QID) | INTRAMUSCULAR | Status: DC | PRN

## 2017-09-30 NOTE — ED Notes (Addendum)
Dr. Lyndel Safe at bedside during pt's BM.  Dr. Lyndel Safe aware of bright red bleeding with clots.  NAD.  Pt tolerated peri-area cleansing linen change well.  Pt reported cramping during BM and stated "I had a coughing spell, that when this began and I couldn't control it."  Dr. Lyndel Safe informed this RN that the colonoscopy is scheduled for 0830, 10/01/17.  Will continue to monitor for any needs or changes

## 2017-09-30 NOTE — Consult Note (Addendum)
Referring Provider: Family Medicine Primary Care Physician:  [name], MD Primary Gastroenterologist:   Althia Forts. Colonoscopy by Dr. Laural Golden in 2011-2112  Reason for Consultation:    GI bleed    ASSESSMENT AND PLAN:    69. 84 year old male with moderate to severe AS,  CAD, COPD admitted after one episode of painless hematochezia (large volume per patient).  Approximate 1 g drop in hemoglobin since February. Stool mixed with blood on DRE.  Etiology of bleeding unclear.  Could have been hemorrhoidal but based on the reddish stool in vault, this seems unlikely. Diverticular bleed is possible though he only had one episode of the bleeding and that is unusual.  Rule out colon neoplasm/polyp, AVM. Etc.. -Spoke with patient and daughter in the room.  Patient would be agreeable to a colonoscopy.  He is of advanced age with cardiac related comorbidities but overall looks hardy for his age.   2. Hx of adenomatous colon polyps 2011, apparently surveillance exam wasn't done.     Attending physician's note   I have taken an interval history, reviewed the chart and examined the patient. I agree with the Advanced Practitioner's note, impression and recommendations.  Discussed with patient's family.  Observed painless hematochezia one episode.  84 year old with moderate to severe left ear, CAD, COPD with painless hematochezia.  Likely diverticular bleed.  Rule out other causes.  Plan: Prep for colonoscopy in a.m. ,trend CBC, transfuse if hemoglobin is below 7. If starts actively bleeding, would require RBC-Scan.   Carmell Austria, MD     HPI: JADRIEN NARINE is a 84 y.o. male CKD 3 , CVA, moderate to severe aortic stenosis, CAD and COPD. He had endocarditis in late Feb, treated with Vanco and Rocephin.  Brought to ED yesterday after episode of rectal bleeding.  Shortly after eating yesterday patient felt bright red blood.  Patient describes it as large volume.  No bleeding since then.  Patient had no  associated abdominal pain no rectal pain.  Hgb in Feb was 13.1, it is 11.9 today.  He has CKD, BUN not out of proportion to Cr. Takes baby asa daily at home. On daily PPI for history of GERD.  Patient has been on iron since femur fracture. The iron has caused some constipation  Patient has a hx of diverticulosis and also adenomatous colon polyps in 2011  (path in [place]).  Mr. Dahan has no nausea, vomiting, unexplained weight loss.  He has no chest pain.  Currently, no shortness of breath.    Past Medical History:  Diagnosis Date  . Aortic valve stenosis   . ASCVD (arteriosclerotic cardiovascular disease)    50% ostial left left anterior desending 60% mid circumflex and normal ejection fractioon in 2006 exertional dyspnea;history of chest discomfort  . Cerebrovascular disease    minimal carotid bruits  . CKD (chronic kidney disease)   . Diabetes mellitus type 2, controlled ([place])    no insulin  . Hyperlipidemia    statin intolerant  . Hypertension   . Tobacco abuse, in remission    30 pack years stopped in 1971    Past Surgical History:  Procedure Laterality Date  . APPENDECTOMY    . CARPAL TUNNEL RELEASE     surgery left 05/05/2001  . KNEE ARTHROSCOPY     bilaterial; unknown associated surgical procedure  . SHOULDER SURGERY     Right x2; third procedure on left  . TEE WITHOUT CARDIOVERSION N/A 07/29/2017   Procedure: TRANSESOPHAGEAL ECHOCARDIOGRAM (TEE) WITH  PROPOFOL;  Surgeon: Satira Sark, MD;  Location: [facility];  Service: Cardiovascular;  Laterality: N/A;  . TONSILLECTOMY    . TRANSURETHRAL INCISION OF PROSTATE     resection of the prostate    Prior to Admission medications   Medication Sig Start Date End Date Taking? Authorizing Provider  acetaminophen (TYLENOL) 325 MG tablet Take 325-650 mg by mouth every 6 (six) hours as needed (for pain).    Yes [provider]  albuterol (PROVENTIL HFA;VENTOLIN HFA) 108 (90 Base) MCG/ACT inhaler Inhale 2 puffs  into the lungs every 4 (four) hours as needed for wheezing or shortness of breath.    Yes [provider]  aspirin 81 MG tablet Take 81 mg by mouth 2 (two) times daily.    Yes [provider]  aspirin-sod bicarb-citric acid (ALKA-SELTZER) 325 MG TBEF tablet Take 325 mg by mouth every 6 (six) hours as needed (for UTI symptoms).   Yes [provider]  betaxolol (BETOPTIC-S) 0.5 % ophthalmic suspension Place 1 drop into both eyes every morning.  12/15/15  Yes [provider]  bimatoprost (LUMIGAN) 0.01 % SOLN Place 1 drop into both eyes every morning.    Yes [provider]  Calcium-Magnesium-Vitamin D (CALCIUM 1200+D3 PO) Take 1 tablet by mouth daily.   Yes [provider]  carvedilol (COREG) 12.5 MG tablet Take 12.5 mg by mouth 2 (two) times daily with a meal.  09/28/12  Yes [provider]  Cranberry 500 MG CAPS Take 500 mg by mouth every evening.    Yes [provider]  diphenhydramine-acetaminophen (TYLENOL PM) 25-500 MG TABS tablet Take 2 tablets by mouth at bedtime.   Yes [provider]  dorzolamide (TRUSOPT) 2 % ophthalmic solution Place 1 drop into both eyes 2 (two) times daily.    Yes [provider]  fexofenadine (ALLEGRA) 180 MG tablet Take 180 mg by mouth daily.   Yes [provider]  Fluticasone-Umeclidin-Vilant (TRELEGY ELLIPTA) 100-62.5-25 MCG/INH AEPB Inhale 1 puff into the lungs daily.   Yes [provider]  furosemide (LASIX) 40 MG tablet Take 1 tablet (40 mg total) by mouth daily. Patient taking differently: Take 20-40 mg by mouth See admin instructions. Take 20 mg by mouth in the morning rotating with 40 mg every other day 08/12/17 11/10/17 Yes Branch, Alphonse Guild, MD  glipiZIDE (GLUCOTROL XL) 5 MG 24 hr tablet Take 5 mg by mouth 2 (two) times daily.   Yes [provider]  Netarsudil Dimesylate (RHOPRESSA) 0.02 % SOLN Place 1 drop into the left eye at bedtime.    Yes  [provider]  Omega-3 Fatty Acids (FISH OIL) 1000 MG CAPS Take 1,000 mg by mouth 2 (two) times daily.    Yes [provider]  omeprazole (PRILOSEC) 20 MG capsule Take 20 mg by mouth daily.  08/08/17  Yes [provider]  oxybutynin (DITROPAN) 5 MG tablet Take 5 mg by mouth 2 (two) times daily.   Yes [provider]  pilocarpine (PILOCAR) 2 % ophthalmic solution Place 1 drop into the left eye 4 (four) times daily.    Yes [provider]  potassium chloride (K-DUR) 10 MEQ tablet Take 10 mEq by mouth daily. ONLY WHILE TAKING LASIX   Yes [provider]  sitaGLIPtin (JANUVIA) 100 MG tablet Take 50 mg by mouth daily.   Yes [provider]  tamsulosin (FLOMAX) 0.4 MG CAPS capsule Take 0.4 mg by mouth daily.   Yes [provider]    Current Facility-Administered Medications  Medication Dose Route Frequency Provider Last Rate Last Dose  . 0.9 %  sodium chloride infusion   Intravenous Continuous Quintella Baton, MD 75 mL/hr at 09/30/17 0506    . carvedilol (COREG) tablet 12.5 mg  12.5 mg Oral BID WC Crosley, Debby, MD   12.5 mg at 09/30/17 0826  . fluticasone furoate-vilanterol (BREO ELLIPTA) 100-25 MCG/INH 1 puff  1 puff Inhalation Daily Ghimire, Shanker M, MD      . insulin aspart (novoLOG) injection 0-15 Units  0-15 Units Subcutaneous Q4H Quintella Baton, MD   3 Units at 09/30/17 0507  . insulin aspart (novoLOG) injection 0-5 Units  0-5 Units Subcutaneous QHS Crosley, Debby, MD      . linagliptin (TRADJENTA) tablet 5 mg  5 mg Oral Daily Crosley, Debby, MD      . Netarsudil Dimesylate 0.02 % SOLN 1 drop  1 drop Left Eye QHS Crosley, Debby, MD      . ondansetron (ZOFRAN) tablet 4 mg  4 mg Oral Q6H PRN Crosley, Debby, MD       Or  . ondansetron (ZOFRAN) injection 4 mg  4 mg Intravenous Q6H PRN Crosley, Debby, MD      . oxybutynin (DITROPAN) tablet 5 mg  5 mg Oral BID Crosley, Debby, MD      . pantoprazole (PROTONIX) injection 40 mg   40 mg Intravenous Q12H Crosley, Debby, MD      . pilocarpine (PILOCAR) 2 % ophthalmic solution 1 drop  1 drop Left Eye QID Crosley, Debby, MD      . tamsulosin (FLOMAX) capsule 0.4 mg  0.4 mg Oral Daily Crosley, Debby, MD      . umeclidinium bromide (INCRUSE ELLIPTA) 62.5 MCG/INH 1 puff  1 puff Inhalation Daily Ghimire, Henreitta Leber, MD       Current Outpatient Medications  Medication Sig Dispense Refill  . acetaminophen (TYLENOL) 325 MG tablet Take 325-650 mg by mouth every 6 (six) hours as needed (for pain).     Marland Kitchen albuterol (PROVENTIL HFA;VENTOLIN HFA) 108 (90 Base) MCG/ACT inhaler Inhale 2 puffs into the lungs every 4 (four) hours as needed for wheezing or shortness of breath.     Marland Kitchen aspirin 81 MG tablet Take 81 mg by mouth 2 (two) times daily.     Marland Kitchen aspirin-sod bicarb-citric acid (ALKA-SELTZER) 325 MG TBEF tablet Take 325 mg by mouth every 6 (six) hours as needed (for UTI symptoms).    . betaxolol (BETOPTIC-S) 0.5 % ophthalmic suspension Place 1 drop into both eyes every morning.     . bimatoprost (LUMIGAN) 0.01 % SOLN Place 1 drop into both eyes every morning.     . Calcium-Magnesium-Vitamin D (CALCIUM 1200+D3 PO) Take 1 tablet by mouth daily.    . carvedilol (COREG) 12.5 MG tablet Take 12.5 mg by mouth 2 (two) times daily with a meal.     . Cranberry 500 MG CAPS Take 500 mg by mouth every evening.     . diphenhydramine-acetaminophen (TYLENOL PM) 25-500 MG TABS tablet Take 2 tablets by mouth at bedtime.    . dorzolamide (TRUSOPT) 2 % ophthalmic solution Place 1 drop into both eyes 2 (two) times daily.     . fexofenadine (ALLEGRA) 180 MG tablet Take 180 mg by mouth daily.    . Fluticasone-Umeclidin-Vilant (TRELEGY ELLIPTA) 100-62.5-25 MCG/INH AEPB Inhale 1 puff into the lungs daily.    . furosemide (LASIX) 40 MG tablet Take 1 tablet (40 mg total) by  mouth daily. (Patient taking differently: Take 20-40 mg by mouth See admin instructions. Take 20 mg by mouth in the morning rotating with 40 mg  every other day) 90 tablet 3  . glipiZIDE (GLUCOTROL XL) 5 MG 24 hr tablet Take 5 mg by mouth 2 (two) times daily.    Mckinley Jewel Dimesylate (RHOPRESSA) 0.02 % SOLN Place 1 drop into the left eye at bedtime.     . Omega-3 Fatty Acids (FISH OIL) 1000 MG CAPS Take 1,000 mg by mouth 2 (two) times daily.     Marland Kitchen omeprazole (PRILOSEC) 20 MG capsule Take 20 mg by mouth daily.     Marland Kitchen oxybutynin (DITROPAN) 5 MG tablet Take 5 mg by mouth 2 (two) times daily.    . pilocarpine (PILOCAR) 2 % ophthalmic solution Place 1 drop into the left eye 4 (four) times daily.     . potassium chloride (K-DUR) 10 MEQ tablet Take 10 mEq by mouth daily. ONLY WHILE TAKING LASIX    . sitaGLIPtin (JANUVIA) 100 MG tablet Take 50 mg by mouth daily.    . tamsulosin (FLOMAX) 0.4 MG CAPS capsule Take 0.4 mg by mouth daily.      Allergies as of 09/29/2017 - Review Complete 09/29/2017  Allergen Reaction Noted  . Codeine Other (See Comments)   . Ezetimibe-simvastatin Other (See Comments)   . Naproxen Other (See Comments)   . Niacin Other (See Comments)   . Nsaids Other (See Comments) 09/29/2017  . Pravastatin sodium Other (See Comments)     History reviewed. No pertinent family history.  Social History   Socioeconomic History  . Marital status: Married    Spouse name: Not on file  . Number of children: 4  . Years of education: Not on file  . Highest education level: Not on file  Occupational History  . Occupation: retired    Fish farm manager: RETIRED    Comment: [employer]  Social Needs  . Financial resource strain: Not on file  . Food insecurity:    Worry: Not on file    Inability: Not on file  . Transportation needs:    Medical: Not on file    Non-medical: Not on file  Tobacco Use  . Smoking status: Former Smoker    Packs/day: 1.00    Years: 40.00    Pack years: 40.00    Types: Cigarettes    Last attempt to quit: 10/29/1969    Years since quitting: 47.9  . Smokeless tobacco: Never Used  Substance and Sexual  Activity  . Alcohol use: No    Alcohol/week: 0.0 oz  . Drug use: No  . Sexual activity: Not on file  Lifestyle  . Physical activity:    Days per week: Not on file    Minutes per session: Not on file  . Stress: Not on file  Relationships  . Social connections:    Talks on phone: Not on file    Gets together: Not on file    Attends religious service: Not on file    Active member of club or organization: Not on file    Attends meetings of clubs or organizations: Not on file    Relationship status: Not on file  . Intimate partner violence:    Fear of current or ex partner: Not on file    Emotionally abused: Not on file    Physically abused: Not on file    Forced sexual activity: Not on file  Other Topics Concern  . Not on  file  Social History Narrative  . Not on file    Review of Systems: All systems reviewed and negative except where noted in HPI.  Physical Exam: Vital signs in last 24 hours: Temp:  [98.2 F (36.8 C)] 98.2 F (36.8 C) (04/11 2151) Pulse Rate:  [82-97] 85 (04/12 0900) Resp:  [16-21] 16 (04/12 0635) BP: (106-130)/(66-84) 130/72 (04/12 0900) SpO2:  [94 %-98 %] 95 % (04/12 0900) Weight:  [196 lb (88.9 kg)] 196 lb (88.9 kg) (04/11 2152)   General:   Alert, well-developed male in NAD Psych:  Pleasant, cooperative. Normal mood and affect. Eyes:  Pupils equal, sclera clear, no icterus.   Conjunctiva pink. Ears:  Normal auditory acuity. Nose:  No deformity, discharge,  or lesions. Neck:  Supple; no masses Lungs:  Decreased breath sounds at bases, bibasilar crackles  Heart:  Regular rate and rhythm; no murmurs, no edema Abdomen:  Soft, non-distended, nontender, BS active, no palp mass    Rectal:  Dried blood around anus. Soft reddish stool in vault Msk:  Symmetrical without gross deformities. . Pulses:  Normal pulses noted. Neurologic:  Alert and  oriented x4;  grossly normal neurologically. Skin:  Intact without significant lesions or  rashes..   Intake/Output from previous day: No intake/output data recorded. Intake/Output this shift: No intake/output data recorded.  Lab Results: Recent Labs    09/29/17 2221 09/30/17 0105 09/30/17 0459  WBC  --  8.1 6.3  HGB 13.9 13.8 11.9*  HCT 41.0 42.4 36.5*  PLT  --  170 155   BMET Recent Labs    09/29/17 2221 09/30/17 0105 09/30/17 0459  NA 139 138 139  K 3.7 3.7 3.9  CL 105 103 106  CO2  --  23 23  GLUCOSE 204* 195* 173*  BUN 28* 27* 30*  CREATININE 1.80* 1.80* 1.71*  CALCIUM  --  8.6* 8.2*   LFT Recent Labs    09/30/17 0105  PROT 6.4*  ALBUMIN 3.5  AST 22  ALT 27  ALKPHOS 63  BILITOT 0.7   PT/INR Recent Labs    09/30/17 0105  LABPROT 13.4  INR 1.03     Studies/Results: No results found.   Tye Savoy, NP-C @  09/30/2017, 10:11 AM  Pager number [phone]

## 2017-09-30 NOTE — H&P (Signed)
PCP:   [name], MD   Chief Complaint:  BRBPR  HPI: this is a 84 y/o male who presents with c/o blood in stool, BRBPR.thetv are unsure if the event repeated. Last colonoscopy prob 10-20 years ago. He has a h/o of colectomy secondary to doiverticulosis. He denies any significant abdominal pain, diarrhea or constipation. He does not use NSAID's, eat spicy foods. He does report GERD. No nausea, no vomiting. His wife called their daughter who brought him to the ER. History n provided mainly by the patient, and daughter present at bedside.   Review of Systems:  The patient denies anorexia, fever, weight loss,, vision loss, decreased hearing, hoarseness, chest pain, syncope, dyspnea on exertion, peripheral edema, balance deficits, hemoptysis, abdominal pain, melena, BRBPR, hematochezia, severe indigestion/heartburn, hematuria, incontinence, genital sores, muscle weakness, suspicious skin lesions, transient blindness, difficulty walking, depression, unusual weight change, abnormal bleeding, enlarged lymph nodes, angioedema, and breast masses.  Past Medical History: Past Medical History:  Diagnosis Date  . Aortic valve stenosis   . ASCVD (arteriosclerotic cardiovascular disease)    50% ostial left left anterior desending 60% mid circumflex and normal ejection fractioon in 2006 exertional dyspnea;history of chest discomfort  . Cerebrovascular disease    minimal carotid bruits  . CKD (chronic kidney disease)   . Diabetes mellitus type 2, controlled ([place])    no insulin  . Hyperlipidemia    statin intolerant  . Hypertension   . Tobacco abuse, in remission    30 pack years stopped in 1971   Past Surgical History:  Procedure Laterality Date  . APPENDECTOMY    . CARPAL TUNNEL RELEASE     surgery left 05/05/2001  . KNEE ARTHROSCOPY     bilaterial; unknown associated surgical procedure  . SHOULDER SURGERY     Right x2; third procedure on left  . TEE WITHOUT CARDIOVERSION N/A 07/29/2017    Procedure: TRANSESOPHAGEAL ECHOCARDIOGRAM (TEE) WITH PROPOFOL;  Surgeon: Satira Sark, MD;  Location: [facility];  Service: Cardiovascular;  Laterality: N/A;  . TONSILLECTOMY    . TRANSURETHRAL INCISION OF PROSTATE     resection of the prostate    Medications: Prior to Admission medications   Medication Sig Start Date End Date Taking? Authorizing Provider  acetaminophen (TYLENOL) 325 MG tablet Take 325-650 mg by mouth every 6 (six) hours as needed (for pain).    Yes [provider]  albuterol (PROVENTIL HFA;VENTOLIN HFA) 108 (90 Base) MCG/ACT inhaler Inhale 2 puffs into the lungs every 4 (four) hours as needed for wheezing or shortness of breath.    Yes [provider]  aspirin 81 MG tablet Take 81 mg by mouth 2 (two) times daily.    Yes [provider]  aspirin-sod bicarb-citric acid (ALKA-SELTZER) 325 MG TBEF tablet Take 325 mg by mouth every 6 (six) hours as needed (for UTI symptoms).   Yes [provider]  betaxolol (BETOPTIC-S) 0.5 % ophthalmic suspension Place 1 drop into both eyes every morning.  12/15/15  Yes [provider]  bimatoprost (LUMIGAN) 0.01 % SOLN Place 1 drop into both eyes every morning.    Yes [provider]  Calcium-Magnesium-Vitamin D (CALCIUM 1200+D3 PO) Take 1 tablet by mouth daily.   Yes [provider]  carvedilol (COREG) 12.5 MG tablet Take 12.5 mg by mouth 2 (two) times daily with a meal.  09/28/12  Yes [provider]  Cranberry 500 MG CAPS Take 500 mg by mouth every evening.  Yes [provider]  diphenhydramine-acetaminophen (TYLENOL PM) 25-500 MG TABS tablet Take 2 tablets by mouth at bedtime.   Yes [provider]  dorzolamide (TRUSOPT) 2 % ophthalmic solution Place 1 drop into both eyes 2 (two) times daily.    Yes [provider]  fexofenadine (ALLEGRA) 180 MG tablet Take 180 mg by mouth daily.   Yes [provider]   Fluticasone-Umeclidin-Vilant (TRELEGY ELLIPTA) 100-62.5-25 MCG/INH AEPB Inhale 1 puff into the lungs daily.   Yes [provider]  furosemide (LASIX) 40 MG tablet Take 1 tablet (40 mg total) by mouth daily. Patient taking differently: Take 20-40 mg by mouth See admin instructions. Take 20 mg by mouth in the morning rotating with 40 mg every other day 08/12/17 11/10/17 Yes Branch, Alphonse Guild, MD  glipiZIDE (GLUCOTROL XL) 5 MG 24 hr tablet Take 5 mg by mouth 2 (two) times daily.   Yes [provider]  Netarsudil Dimesylate (RHOPRESSA) 0.02 % SOLN Place 1 drop into the left eye at bedtime.    Yes [provider]  Omega-3 Fatty Acids (FISH OIL) 1000 MG CAPS Take 1,000 mg by mouth 2 (two) times daily.    Yes [provider]  omeprazole (PRILOSEC) 20 MG capsule Take 20 mg by mouth daily.  08/08/17  Yes [provider]  oxybutynin (DITROPAN) 5 MG tablet Take 5 mg by mouth 2 (two) times daily.   Yes [provider]  pilocarpine (PILOCAR) 2 % ophthalmic solution Place 1 drop into the left eye 4 (four) times daily.    Yes [provider]  potassium chloride (K-DUR) 10 MEQ tablet Take 10 mEq by mouth daily. ONLY WHILE TAKING LASIX   Yes [provider]  sitaGLIPtin (JANUVIA) 100 MG tablet Take 50 mg by mouth daily.   Yes [provider]  tamsulosin (FLOMAX) 0.4 MG CAPS capsule Take 0.4 mg by mouth daily.   Yes [provider]    Allergies:   Allergies  Allergen Reactions  . Codeine Other (See Comments)    Reaction not recalled  . Ezetimibe-Simvastatin Other (See Comments)    Muscle pain  . Naproxen Other (See Comments)    Reaction not recalled  . Niacin Other (See Comments)    Reaction not recalled  . Nsaids Other (See Comments)    Reaction not recalled  . Pravastatin Sodium Other (See Comments)    Muscle pain    Social History:  reports that he quit smoking about 47 years ago. His smoking use included  cigarettes. He has a 40.00 pack-year smoking history. He has never used smokeless tobacco. He reports that he does not drink alcohol or use drugs.  Family History: History reviewed. No pertinent family history.  Physical Exam: Vitals:   09/30/17 0000 09/30/17 0100 09/30/17 0110 09/30/17 0322  BP:   122/77 130/84  Pulse: 93 93 93 92  Resp: 16 18 18 16   Temp:      TempSrc:      SpO2: 96% 95% 97% 97%  Weight:      Height:        General:  Alert and oriented times three, well developed and nourished, no acute distress Eyes: PERRLA, pink conjunctiva, no scleral icterus ENT: Moist oral mucosa, neck supple, no thyromegaly Lungs: clear to ascultation, no wheeze, no crackles, no use of accessory muscles Cardiovascular: regular rate and rhythm, no regurgitation, no gallops, no murmurs. No carotid bruits, no JVD Abdomen: soft, positive BS, non-tender, non-distended, no organomegaly, not  an acute abdomen GU: not examined Neuro: CN II - XII grossly intact, sensation intact Musculoskeletal: strength 5/5 all extremities, no clubbing, cyanosis or edema Skin: no rash, no subcutaneous crepitation, no decubitus Psych: appropriate patient   Labs on Admission:  Recent Labs    09/29/17 2221 09/30/17 0105  NA 139 138  K 3.7 3.7  CL 105 103  CO2  --  23  GLUCOSE 204* 195*  BUN 28* 27*  CREATININE 1.80* 1.80*  CALCIUM  --  8.6*   Recent Labs    09/30/17 0105  AST 22  ALT 27  ALKPHOS 63  BILITOT 0.7  PROT 6.4*  ALBUMIN 3.5   No results for input(s): LIPASE, AMYLASE in the last 72 hours. Recent Labs    09/29/17 2221 09/30/17 0105  WBC  --  8.1  NEUTROABS  --  6.2  HGB 13.9 13.8  HCT 41.0 42.4  MCV  --  92.4  PLT  --  170   No results for input(s): CKTOTAL, CKMB, CKMBINDEX, TROPONINI in the last 72 hours. Invalid input(s): POCBNP No results for input(s): DDIMER in the last 72 hours. No results for input(s): HGBA1C in the last 72 hours. No results for input(s): CHOL, HDL,  LDLCALC, TRIG, CHOLHDL, LDLDIRECT in the last 72 hours. No results for input(s): TSH, T4TOTAL, T3FREE, THYROIDAB in the last 72 hours.  Invalid input(s): FREET3 No results for input(s): VITAMINB12, FOLATE, FERRITIN, TIBC, IRON, RETICCTPCT in the last 72 hours.  Micro Results: No results found for this or any previous visit (from the past 240 hour(s)).   Radiological Exams on Admission: No results found.  Assessment/Plan Present on Admission: . GI bleed -admit to medsurg -NPO, IVF hydration -GI consulted by Er physician -protonix IV -serial H/H  . CKD (chronic kidney disease), stage III ([place]) -stable, at baseline  . Essential hypertension -stable, home meds resumed  . HLD (hyperlipidemia) -stable, home med held  . Peripheral vascular disease ([place]) -aware  . Type 2 diabetes mellitus with complications ([facility]) -stable, home meds resumed -SSI   . h/o CVA -aware  Cherese Lozano 09/30/2017, 4:16 AM

## 2017-09-30 NOTE — Progress Notes (Signed)
Transferred from [place] alert,oriented via wheelchair,accompanied by her daughter

## 2017-09-30 NOTE — ED Notes (Signed)
Clear Liquid Diet was ordered for Lunch.

## 2017-09-30 NOTE — Evaluation (Signed)
Physical Therapy Evaluation Patient Details Name: Anthony Murray MRN: [id number] DOB: September 06, 1927 Today's Date: 09/30/2017   History of Present Illness  Pt is an 84 y/o male admitted secondary to BRBPR with GI bleed. PMH including but not limited to CKD, HTN, DM and ASCVD.    Clinical Impression  Pt presented supine in bed with HOB elevated, awake and willing to participate in therapy session. Prior to admission, pt reported that he could ambulate short distances with a RW and used a manual w/c primarily for mobility (can propel himself). Pt also stated that he can dress and sponge bathe himself. Pt tolerated bed mobility with min A, transfers with min A and was able to take a few sides steps at EOB with RW and min guard. Pt's daughter present throughout and both are agreeable to HHPT. All VSS throughout with pt on RA. Pt would continue to benefit from skilled physical therapy services at this time while admitted and after d/c to address the below listed limitations in order to improve overall safety and independence with functional mobility.     Follow Up Recommendations Home health PT;Supervision/Assistance - 24 hour    Equipment Recommendations  None recommended by PT    Recommendations for Other Services       Precautions / Restrictions Restrictions Weight Bearing Restrictions: No      Mobility  Bed Mobility Overal bed mobility: Needs Assistance Bed Mobility: Supine to Sit;Sit to Supine     Supine to sit: Min assist Sit to supine: Min assist   General bed mobility comments: increased time and effort, assist to eleavte trunk and assist with bilateral LE return to bed  Transfers Overall transfer level: Needs assistance Equipment used: Rolling walker (2 wheeled) Transfers: Sit to/from Stand Sit to Stand: Min assist         General transfer comment: increased time and effort, good technique, assist for stability with transitional movement  Ambulation/Gait              General Gait Details: pt able to take 2-3 side steps at EOB with RW and min guard, limited by fatigue  Stairs            Wheelchair Mobility    Modified Rankin (Stroke Patients Only)       Balance Overall balance assessment: Needs assistance Sitting-balance support: Feet supported Sitting balance-Leahy Scale: Poor Sitting balance - Comments: posterior lean requiring verbal and tactile cueing to correct; pt progressing from min A to close min guard for safety with sitting balance Postural control: Posterior lean Standing balance support: During functional activity;Bilateral upper extremity supported Standing balance-Leahy Scale: Poor                               Pertinent Vitals/Pain Pain Assessment: No/denies pain    Home Living Family/patient expects to be discharged to:: Private residence Living Arrangements: Spouse/significant other Available Help at Discharge: Family;Available 24 hours/day Type of Home: House Home Access: Ramped entrance     Home Layout: One level Home Equipment: Walker - 2 wheels;Bedside commode;Wheelchair - manual      Prior Function Level of Independence: Needs assistance   Gait / Transfers Assistance Needed: pt able to ambulate short distances with RW, but primarily uses a w/c, can propel himself  ADL's / Homemaking Assistance Needed: can dress himself per daughter and gives himself a "sponge bath"        Hand Dominance  Extremity/Trunk Assessment   Upper Extremity Assessment Upper Extremity Assessment: Generalized weakness    Lower Extremity Assessment Lower Extremity Assessment: Generalized weakness    Cervical / Trunk Assessment Cervical / Trunk Assessment: Kyphotic  Communication   Communication: No difficulties  Cognition Arousal/Alertness: Awake/alert Behavior During Therapy: WFL for tasks assessed/performed Overall Cognitive Status: Within Functional Limits for tasks assessed                                         General Comments      Exercises     Assessment/Plan    PT Assessment Patient needs continued PT services  PT Problem List Decreased strength;Decreased balance;Decreased mobility;Decreased coordination;Decreased safety awareness       PT Treatment Interventions DME instruction;Gait training;Functional mobility training;Therapeutic activities;Stair training;Therapeutic exercise;Balance training;Neuromuscular re-education;Patient/family education    PT Goals (Current goals can be found in the Care Plan section)  Acute Rehab PT Goals Patient Stated Goal: return home PT Goal Formulation: With patient/family Time For Goal Achievement: 10/14/17 Potential to Achieve Goals: Good    Frequency Min 3X/week   Barriers to discharge        Co-evaluation               AM-PAC PT "6 Clicks" Daily Activity  Outcome Measure Difficulty turning over in bed (including adjusting bedclothes, sheets and blankets)?: Unable Difficulty moving from lying on back to sitting on the side of the bed? : Unable Difficulty sitting down on and standing up from a chair with arms (e.g., wheelchair, bedside commode, etc,.)?: Unable Help needed moving to and from a bed to chair (including a wheelchair)?: A Little Help needed walking in hospital room?: A Little Help needed climbing 3-5 steps with a railing? : A Lot 6 Click Score: 11    End of Session Equipment Utilized During Treatment: Gait belt Activity Tolerance: Patient limited by fatigue Patient left: in bed;with call bell/phone within reach;with family/visitor present Nurse Communication: Mobility status PT Visit Diagnosis: Other abnormalities of gait and mobility (R26.89)    Time: 6010-9323 PT Time Calculation (min) (ACUTE ONLY): 19 min   Charges:   PT Evaluation $PT Eval Moderate Complexity: 1 Mod     PT G Codes:        [place], PT, DPT [place] 09/30/2017, 1:48  PM

## 2017-09-30 NOTE — Progress Notes (Signed)
PROGRESS NOTE        PATIENT DETAILS Name: Anthony Murray Age: 84 y.o. Sex: male Date of Birth: [birth year] Admit Date: 09/29/2017 Admitting Physician No admitting provider for patient encounter. MNO:TRRN, Edwinna Areola, MD  Brief Narrative: Patient is a 84 y.o. male with prior history of DM-2, hypertension, moderate aortic stenosis-recent history of culture negative aortic valve endocarditis with septic emboli to the brain-presented to the ED-for evaluation of hematochezia.  Subjective: Last episode of hematochezia was late last night.  None since this morning.  Denies any abdominal pain  Assessment/Plan: Lower GI bleeding: Likely diverticulosis.  Thankfully bleeding has slowed down significantly.  Continue with supportive care, GI following and contemplating colonoscopy.  Will await further recommendations.    Mild acute blood loss anemia: CBC relatively stable this morning-continue to follow and transfuse only if significant drop in hemoglobin.  Recent history of culture negative aortic valve endocarditis with septic emboli: Completed a course of IV antimicrobial therapy.  Hypertension: Blood pressure relatively well controlled-continue with Coreg.  Moderate aortic stenosis: Stable for outpatient follow-up.  DM-2: CBGs stable-hold all oral hypoglycemic agents-continue with SSI.  Chronic kidney disease stage III: Creatinine close to usual baseline-follow periodically  BPH: Continue Flomax  COPD: Stable-clear to auscultation-continue inhaler regimen  DVT Prophylaxis: Full dose anticoagulation   Code Status: Full code   Family Communication: Daughter at bedside  Disposition Plan: Remain inpatient-home in the next few days  Antimicrobial agents: Anti-infectives (From admission, onward)   None      Procedures: None  CONSULTS:  GI  Time spent: 25 minutes-Greater than 50% of this time was spent in counseling, explanation of diagnosis, planning  of further management, and coordination of care.  MEDICATIONS: Scheduled Meds: . carvedilol  12.5 mg Oral BID WC  . fluticasone furoate-vilanterol  1 puff Inhalation Daily  . insulin aspart  0-15 Units Subcutaneous Q4H  . insulin aspart  0-5 Units Subcutaneous QHS  . linagliptin  5 mg Oral Daily  . Netarsudil Dimesylate  1 drop Left Eye QHS  . oxybutynin  5 mg Oral BID  . pantoprazole (PROTONIX) IV  40 mg Intravenous Q12H  . peg 3350 powder  0.5 kit Oral Once   And  . [START ON 10/01/2017] peg 3350 powder  1 kit Oral Once  . pilocarpine  1 drop Left Eye QID  . tamsulosin  0.4 mg Oral Daily  . umeclidinium bromide  1 puff Inhalation Daily   Continuous Infusions: . sodium chloride 75 mL/hr at 09/30/17 0506   PRN Meds:.ondansetron **OR** ondansetron (ZOFRAN) IV   PHYSICAL EXAM: Vital signs: Vitals:   09/30/17 0635 09/30/17 0803 09/30/17 0900 09/30/17 1000  BP: 115/66  130/72 136/82  Pulse: 82 88 85 80  Resp: 16     Temp:      TempSrc:      SpO2: 96% 98% 95% 99%  Weight:      Height:       Filed Weights   09/29/17 2152  Weight: 88.9 kg (196 lb)   Body mass index is 30.7 kg/m.   General appearance :Awake, alert, not in any distress. Eyes:, pupils equally reactive to light and accomodatio HEENT: Atraumatic and Normocephalic Neck: supple, no JVD. No cervical lymphadenopathy.  Resp:Good air entry bilaterally, no added sounds  CVS: S1 S2 regular,3/6 syst murmur GI: Bowel sounds present, Non tender  and not distended with no gaurding, rigidity or rebound.No organomegaly Extremities: B/L Lower Ext shows no edema, both legs are warm to touch Neurology:  speech clear,Non focal, sensation is grossly intact. Musculoskeletal:No digital cyanosis Skin:No Rash, warm and dry Wounds:N/A  I have personally reviewed following labs and imaging studies  LABORATORY DATA: CBC: Recent Labs  Lab 09/29/17 2221 09/30/17 0105 09/30/17 0459  WBC  --  8.1 6.3  NEUTROABS  --  6.2  --     HGB 13.9 13.8 11.9*  HCT 41.0 42.4 36.5*  MCV  --  92.4 91.9  PLT  --  170 269    Basic Metabolic Panel: Recent Labs  Lab 09/29/17 2221 09/30/17 0105 09/30/17 0459  NA 139 138 139  K 3.7 3.7 3.9  CL 105 103 106  CO2  --  23 23  GLUCOSE 204* 195* 173*  BUN 28* 27* 30*  CREATININE 1.80* 1.80* 1.71*  CALCIUM  --  8.6* 8.2*    GFR: Estimated Creatinine Clearance: 31.8 mL/min (A) (by C-G formula based on SCr of 1.71 mg/dL (H)).  Liver Function Tests: Recent Labs  Lab 09/30/17 0105  AST 22  ALT 27  ALKPHOS 63  BILITOT 0.7  PROT 6.4*  ALBUMIN 3.5   No results for input(s): LIPASE, AMYLASE in the last 168 hours. No results for input(s): AMMONIA in the last 168 hours.  Coagulation Profile: Recent Labs  Lab 09/30/17 0105  INR 1.03    Cardiac Enzymes: No results for input(s): CKTOTAL, CKMB, CKMBINDEX, TROPONINI in the last 168 hours.  BNP (last 3 results) No results for input(s): PROBNP in the last 8760 hours.  HbA1C: No results for input(s): HGBA1C in the last 72 hours.  CBG: Recent Labs  Lab 09/30/17 0455 09/30/17 0814  GLUCAP 160* 105*    Lipid Profile: No results for input(s): CHOL, HDL, LDLCALC, TRIG, CHOLHDL, LDLDIRECT in the last 72 hours.  Thyroid Function Tests: No results for input(s): TSH, T4TOTAL, FREET4, T3FREE, THYROIDAB in the last 72 hours.  Anemia Panel: No results for input(s): VITAMINB12, FOLATE, FERRITIN, TIBC, IRON, RETICCTPCT in the last 72 hours.  Urine analysis:    Component Value Date/Time   COLORURINE YELLOW 07/27/2017 0718   APPEARANCEUR CLEAR 07/27/2017 0718   LABSPEC 1.013 07/27/2017 0718   PHURINE 8.0 07/27/2017 0718   GLUCOSEU NEGATIVE 07/27/2017 0718   HGBUR NEGATIVE 07/27/2017 0718   BILIRUBINUR NEGATIVE 07/27/2017 0718   KETONESUR NEGATIVE 07/27/2017 0718   PROTEINUR >=300 (A) 07/27/2017 0718   NITRITE NEGATIVE 07/27/2017 0718   LEUKOCYTESUR NEGATIVE 07/27/2017 0718    Sepsis Labs: Lactic Acid, Venous     Component Value Date/Time   LATICACIDVEN 1.4 07/27/2017 1242    MICROBIOLOGY: No results found for this or any previous visit (from the past 240 hour(s)).  RADIOLOGY STUDIES/RESULTS: No results found.   LOS: 0 days   Oren Binet, MD  [facility] Pager:[phone]  If 7PM-7AM, please contact night-coverage [email or web address] Password [facility] 09/30/2017, 11:09 AM

## 2017-09-30 NOTE — H&P (View-Only) (Signed)
Referring Provider: Family Medicine Primary Care Physician:  Anthony Squibb, MD Primary Gastroenterologist:   Anthony Murray. Colonoscopy by Dr. Laural Murray in 2011-2112  Reason for Consultation:    GI bleed    ASSESSMENT AND PLAN:    2. 84 year old male with moderate to severe AS,  CAD, COPD admitted after one episode of painless hematochezia (large volume per patient).  Approximate 1 g drop in hemoglobin since February. Stool mixed with blood on DRE.  Etiology of bleeding unclear.  Could have been hemorrhoidal but based on the reddish stool in vault, this seems unlikely. Diverticular bleed is possible though he only had one episode of the bleeding and that is unusual.  Rule out colon neoplasm/polyp, AVM. Etc.. -Spoke with patient and daughter in the room.  Patient would be agreeable to a colonoscopy.  He is of advanced age with cardiac related comorbidities but overall looks hardy for his age.   2. Hx of adenomatous colon polyps 2011, apparently surveillance exam wasn't done.     Attending physician's note   I have taken an interval history, reviewed the chart and examined the patient. I agree with the Advanced Practitioner's note, impression and recommendations.  Discussed with patient's family.  Observed painless hematochezia one episode.  84 year old with moderate to severe left ear, CAD, COPD with painless hematochezia.  Likely diverticular bleed.  Rule out other causes.  Plan: Prep for colonoscopy in a.m. ,trend CBC, transfuse if hemoglobin is below 7. If starts actively bleeding, would require RBC-Scan.   Anthony Austria, MD     HPI: Anthony Murray is a 84 y.o. male CKD 3 , CVA, moderate to severe aortic stenosis, CAD and COPD. He had endocarditis in late Feb, treated with Vanco and Rocephin.  Brought to ED yesterday after episode of rectal bleeding.  Shortly after eating yesterday patient felt bright red blood.  Patient describes it as large volume.  No bleeding since then.  Patient had no  associated abdominal pain no rectal pain.  Hgb in Feb was 13.1, it is 11.9 today.  He has CKD, BUN not out of proportion to Cr. Takes baby asa daily at home. On daily PPI for history of GERD.  Patient has been on iron since femur fracture. The iron has caused some constipation  Patient has a hx of diverticulosis and also adenomatous colon polyps in 2011  (path in [place]).  Anthony Murray has no nausea, vomiting, unexplained weight loss.  He has no chest pain.  Currently, no shortness of breath.    Past Medical History:  Diagnosis Date  . Aortic valve stenosis   . ASCVD (arteriosclerotic cardiovascular disease)    50% ostial left left anterior desending 60% mid circumflex and normal ejection fractioon in 2006 exertional dyspnea;history of chest discomfort  . Cerebrovascular disease    minimal carotid bruits  . CKD (chronic kidney disease)   . Diabetes mellitus type 2, controlled ([place])    no insulin  . Hyperlipidemia    statin intolerant  . Hypertension   . Tobacco abuse, in remission    30 pack years stopped in 1971    Past Surgical History:  Procedure Laterality Date  . APPENDECTOMY    . CARPAL TUNNEL RELEASE     surgery left 05/05/2001  . KNEE ARTHROSCOPY     bilaterial; unknown associated surgical procedure  . SHOULDER SURGERY     Right x2; third procedure on left  . TEE WITHOUT CARDIOVERSION N/A 07/29/2017   Procedure: TRANSESOPHAGEAL ECHOCARDIOGRAM (TEE) WITH  PROPOFOL;  Surgeon: Anthony Sark, MD;  Location: [facility];  Service: Cardiovascular;  Laterality: N/A;  . TONSILLECTOMY    . TRANSURETHRAL INCISION OF PROSTATE     resection of the prostate    Prior to Admission medications   Medication Sig Start Date End Date Taking? Authorizing Provider  acetaminophen (TYLENOL) 325 MG tablet Take 325-650 mg by mouth every 6 (six) hours as needed (for pain).    Yes [provider]  albuterol (PROVENTIL HFA;VENTOLIN HFA) 108 (90 Base) MCG/ACT inhaler Inhale 2 puffs  into the lungs every 4 (four) hours as needed for wheezing or shortness of breath.    Yes [provider]  aspirin 81 MG tablet Take 81 mg by mouth 2 (two) times daily.    Yes [provider]  aspirin-sod bicarb-citric acid (ALKA-SELTZER) 325 MG TBEF tablet Take 325 mg by mouth every 6 (six) hours as needed (for UTI symptoms).   Yes [provider]  betaxolol (BETOPTIC-S) 0.5 % ophthalmic suspension Place 1 drop into both eyes every morning.  12/15/15  Yes [provider]  bimatoprost (LUMIGAN) 0.01 % SOLN Place 1 drop into both eyes every morning.    Yes [provider]  Calcium-Magnesium-Vitamin D (CALCIUM 1200+D3 PO) Take 1 tablet by mouth daily.   Yes [provider]  carvedilol (COREG) 12.5 MG tablet Take 12.5 mg by mouth 2 (two) times daily with a meal.  09/28/12  Yes [provider]  Cranberry 500 MG CAPS Take 500 mg by mouth every evening.    Yes [provider]  diphenhydramine-acetaminophen (TYLENOL PM) 25-500 MG TABS tablet Take 2 tablets by mouth at bedtime.   Yes [provider]  dorzolamide (TRUSOPT) 2 % ophthalmic solution Place 1 drop into both eyes 2 (two) times daily.    Yes [provider]  fexofenadine (ALLEGRA) 180 MG tablet Take 180 mg by mouth daily.   Yes [provider]  Fluticasone-Umeclidin-Vilant (TRELEGY ELLIPTA) 100-62.5-25 MCG/INH AEPB Inhale 1 puff into the lungs daily.   Yes [provider]  furosemide (LASIX) 40 MG tablet Take 1 tablet (40 mg total) by mouth daily. Patient taking differently: Take 20-40 mg by mouth See admin instructions. Take 20 mg by mouth in the morning rotating with 40 mg every other day 08/12/17 11/10/17 Yes Branch, Alphonse Guild, MD  glipiZIDE (GLUCOTROL XL) 5 MG 24 hr tablet Take 5 mg by mouth 2 (two) times daily.   Yes [provider]  Netarsudil Dimesylate (RHOPRESSA) 0.02 % SOLN Place 1 drop into the left eye at bedtime.    Yes  [provider]  Omega-3 Fatty Acids (FISH OIL) 1000 MG CAPS Take 1,000 mg by mouth 2 (two) times daily.    Yes [provider]  omeprazole (PRILOSEC) 20 MG capsule Take 20 mg by mouth daily.  08/08/17  Yes [provider]  oxybutynin (DITROPAN) 5 MG tablet Take 5 mg by mouth 2 (two) times daily.   Yes [provider]  pilocarpine (PILOCAR) 2 % ophthalmic solution Place 1 drop into the left eye 4 (four) times daily.    Yes [provider]  potassium chloride (K-DUR) 10 MEQ tablet Take 10 mEq by mouth daily. ONLY WHILE TAKING LASIX   Yes [provider]  sitaGLIPtin (JANUVIA) 100 MG tablet Take 50 mg by mouth daily.   Yes [provider]  tamsulosin (FLOMAX) 0.4 MG CAPS capsule Take 0.4 mg by mouth daily.   Yes [provider]    Current Facility-Administered Medications  Medication Dose Route Frequency Provider Last Rate Last Dose  . 0.9 %  sodium chloride infusion   Intravenous Continuous Quintella Baton, MD 75 mL/hr at 09/30/17 0506    . carvedilol (COREG) tablet 12.5 mg  12.5 mg Oral BID WC Crosley, Debby, MD   12.5 mg at 09/30/17 0826  . fluticasone furoate-vilanterol (BREO ELLIPTA) 100-25 MCG/INH 1 puff  1 puff Inhalation Daily Ghimire, Shanker M, MD      . insulin aspart (novoLOG) injection 0-15 Units  0-15 Units Subcutaneous Q4H Quintella Baton, MD   3 Units at 09/30/17 0507  . insulin aspart (novoLOG) injection 0-5 Units  0-5 Units Subcutaneous QHS Crosley, Debby, MD      . linagliptin (TRADJENTA) tablet 5 mg  5 mg Oral Daily Crosley, Debby, MD      . Netarsudil Dimesylate 0.02 % SOLN 1 drop  1 drop Left Eye QHS Crosley, Debby, MD      . ondansetron (ZOFRAN) tablet 4 mg  4 mg Oral Q6H PRN Crosley, Debby, MD       Or  . ondansetron (ZOFRAN) injection 4 mg  4 mg Intravenous Q6H PRN Crosley, Debby, MD      . oxybutynin (DITROPAN) tablet 5 mg  5 mg Oral BID Crosley, Debby, MD      . pantoprazole (PROTONIX) injection 40 mg   40 mg Intravenous Q12H Crosley, Debby, MD      . pilocarpine (PILOCAR) 2 % ophthalmic solution 1 drop  1 drop Left Eye QID Crosley, Debby, MD      . tamsulosin (FLOMAX) capsule 0.4 mg  0.4 mg Oral Daily Crosley, Debby, MD      . umeclidinium bromide (INCRUSE ELLIPTA) 62.5 MCG/INH 1 puff  1 puff Inhalation Daily Ghimire, Henreitta Leber, MD       Current Outpatient Medications  Medication Sig Dispense Refill  . acetaminophen (TYLENOL) 325 MG tablet Take 325-650 mg by mouth every 6 (six) hours as needed (for pain).     Marland Kitchen albuterol (PROVENTIL HFA;VENTOLIN HFA) 108 (90 Base) MCG/ACT inhaler Inhale 2 puffs into the lungs every 4 (four) hours as needed for wheezing or shortness of breath.     Marland Kitchen aspirin 81 MG tablet Take 81 mg by mouth 2 (two) times daily.     Marland Kitchen aspirin-sod bicarb-citric acid (ALKA-SELTZER) 325 MG TBEF tablet Take 325 mg by mouth every 6 (six) hours as needed (for UTI symptoms).    . betaxolol (BETOPTIC-S) 0.5 % ophthalmic suspension Place 1 drop into both eyes every morning.     . bimatoprost (LUMIGAN) 0.01 % SOLN Place 1 drop into both eyes every morning.     . Calcium-Magnesium-Vitamin D (CALCIUM 1200+D3 PO) Take 1 tablet by mouth daily.    . carvedilol (COREG) 12.5 MG tablet Take 12.5 mg by mouth 2 (two) times daily with a meal.     . Cranberry 500 MG CAPS Take 500 mg by mouth every evening.     . diphenhydramine-acetaminophen (TYLENOL PM) 25-500 MG TABS tablet Take 2 tablets by mouth at bedtime.    . dorzolamide (TRUSOPT) 2 % ophthalmic solution Place 1 drop into both eyes 2 (two) times daily.     . fexofenadine (ALLEGRA) 180 MG tablet Take 180 mg by mouth daily.    . Fluticasone-Umeclidin-Vilant (TRELEGY ELLIPTA) 100-62.5-25 MCG/INH AEPB Inhale 1 puff into the lungs daily.    . furosemide (LASIX) 40 MG tablet Take 1 tablet (40 mg total) by  mouth daily. (Patient taking differently: Take 20-40 mg by mouth See admin instructions. Take 20 mg by mouth in the morning rotating with 40 mg  every other day) 90 tablet 3  . glipiZIDE (GLUCOTROL XL) 5 MG 24 hr tablet Take 5 mg by mouth 2 (two) times daily.    Mckinley Jewel Dimesylate (RHOPRESSA) 0.02 % SOLN Place 1 drop into the left eye at bedtime.     . Omega-3 Fatty Acids (FISH OIL) 1000 MG CAPS Take 1,000 mg by mouth 2 (two) times daily.     Marland Kitchen omeprazole (PRILOSEC) 20 MG capsule Take 20 mg by mouth daily.     Marland Kitchen oxybutynin (DITROPAN) 5 MG tablet Take 5 mg by mouth 2 (two) times daily.    . pilocarpine (PILOCAR) 2 % ophthalmic solution Place 1 drop into the left eye 4 (four) times daily.     . potassium chloride (K-DUR) 10 MEQ tablet Take 10 mEq by mouth daily. ONLY WHILE TAKING LASIX    . sitaGLIPtin (JANUVIA) 100 MG tablet Take 50 mg by mouth daily.    . tamsulosin (FLOMAX) 0.4 MG CAPS capsule Take 0.4 mg by mouth daily.      Allergies as of 09/29/2017 - Review Complete 09/29/2017  Allergen Reaction Noted  . Codeine Other (See Comments)   . Ezetimibe-simvastatin Other (See Comments)   . Naproxen Other (See Comments)   . Niacin Other (See Comments)   . Nsaids Other (See Comments) 09/29/2017  . Pravastatin sodium Other (See Comments)     History reviewed. No pertinent family history.  Social History   Socioeconomic History  . Marital status: Married    Spouse name: Not on file  . Number of children: 4  . Years of education: Not on file  . Highest education level: Not on file  Occupational History  . Occupation: retired    Fish farm manager: RETIRED    Comment: [employer]  Social Needs  . Financial resource strain: Not on file  . Food insecurity:    Worry: Not on file    Inability: Not on file  . Transportation needs:    Medical: Not on file    Non-medical: Not on file  Tobacco Use  . Smoking status: Former Smoker    Packs/day: 1.00    Years: 40.00    Pack years: 40.00    Types: Cigarettes    Last attempt to quit: 10/29/1969    Years since quitting: 47.9  . Smokeless tobacco: Never Used  Substance and Sexual  Activity  . Alcohol use: No    Alcohol/week: 0.0 oz  . Drug use: No  . Sexual activity: Not on file  Lifestyle  . Physical activity:    Days per week: Not on file    Minutes per session: Not on file  . Stress: Not on file  Relationships  . Social connections:    Talks on phone: Not on file    Gets together: Not on file    Attends religious service: Not on file    Active member of club or organization: Not on file    Attends meetings of clubs or organizations: Not on file    Relationship status: Not on file  . Intimate partner violence:    Fear of current or ex partner: Not on file    Emotionally abused: Not on file    Physically abused: Not on file    Forced sexual activity: Not on file  Other Topics Concern  . Not on  file  Social History Narrative  . Not on file    Review of Systems: All systems reviewed and negative except where noted in HPI.  Physical Exam: Vital signs in last 24 hours: Temp:  [98.2 F (36.8 C)] 98.2 F (36.8 C) (04/11 2151) Pulse Rate:  [82-97] 85 (04/12 0900) Resp:  [16-21] 16 (04/12 0635) BP: (106-130)/(66-84) 130/72 (04/12 0900) SpO2:  [94 %-98 %] 95 % (04/12 0900) Weight:  [196 lb (88.9 kg)] 196 lb (88.9 kg) (04/11 2152)   General:   Alert, well-developed male in NAD Psych:  Pleasant, cooperative. Normal mood and affect. Eyes:  Pupils equal, sclera clear, no icterus.   Conjunctiva pink. Ears:  Normal auditory acuity. Nose:  No deformity, discharge,  or lesions. Neck:  Supple; no masses Lungs:  Decreased breath sounds at bases, bibasilar crackles  Heart:  Regular rate and rhythm; no murmurs, no edema Abdomen:  Soft, non-distended, nontender, BS active, no palp mass    Rectal:  Dried blood around anus. Soft reddish stool in vault Msk:  Symmetrical without gross deformities. . Pulses:  Normal pulses noted. Neurologic:  Alert and  oriented x4;  grossly normal neurologically. Skin:  Intact without significant lesions or  rashes..   Intake/Output from previous day: No intake/output data recorded. Intake/Output this shift: No intake/output data recorded.  Lab Results: Recent Labs    09/29/17 2221 09/30/17 0105 09/30/17 0459  WBC  --  8.1 6.3  HGB 13.9 13.8 11.9*  HCT 41.0 42.4 36.5*  PLT  --  170 155   BMET Recent Labs    09/29/17 2221 09/30/17 0105 09/30/17 0459  NA 139 138 139  K 3.7 3.7 3.9  CL 105 103 106  CO2  --  23 23  GLUCOSE 204* 195* 173*  BUN 28* 27* 30*  CREATININE 1.80* 1.80* 1.71*  CALCIUM  --  8.6* 8.2*   LFT Recent Labs    09/30/17 0105  PROT 6.4*  ALBUMIN 3.5  AST 22  ALT 27  ALKPHOS 63  BILITOT 0.7   PT/INR Recent Labs    09/30/17 0105  LABPROT 13.4  INR 1.03     Studies/Results: No results found.   Tye Savoy, NP-C @  09/30/2017, 10:11 AM  Pager number [phone]

## 2017-09-30 NOTE — ED Notes (Signed)
PT at bedside.  Pt provided with clear liquid diet at this time.

## 2017-09-30 NOTE — Progress Notes (Signed)
Received consult for dosing of DDAVP for GI Bleed associated with Uremia in CKD  Plan:  DDAVP IV 0.3 mcg/kg x 1 over 30 min  Levester Fresh, PharmD, BCPS, BCCCP Clinical Pharmacist Clinical phone for 09/30/2017 from 1430 - 2300: [phone] If after 2300, please call main pharmacy at: [phone] 09/30/2017 5:13 PM

## 2017-10-01 ENCOUNTER — Encounter (HOSPITAL_COMMUNITY): Payer: Self-pay | Admitting: *Deleted

## 2017-10-01 ENCOUNTER — Encounter (HOSPITAL_COMMUNITY)
Admission: EM | Disposition: A | Discharge status: Home Health | Payer: Self-pay | Source: Home / Self Care | Attending: Internal Medicine

## 2017-10-01 ENCOUNTER — Inpatient Hospital Stay (HOSPITAL_COMMUNITY): Payer: PPO | Admitting: Anesthesiology

## 2017-10-01 HISTORY — PX: COLONOSCOPY: SHX5424

## 2017-10-01 LAB — HEMOGLOBIN AND HEMATOCRIT, BLOOD
HCT: 27.6 % — ABNORMAL LOW (ref 39.0–52.0)
HCT: 23.6 % — ABNORMAL LOW (ref 39.0–52.0)
HEMATOCRIT: 31.6 % — AB (ref 39.0–52.0)
HEMOGLOBIN: 10.1 g/dL — AB (ref 13.0–17.0)
HEMOGLOBIN: 8.8 g/dL — AB (ref 13.0–17.0)
Hemoglobin: 7.7 g/dL — ABNORMAL LOW (ref 13.0–17.0)

## 2017-10-01 LAB — GLUCOSE, CAPILLARY
GLUCOSE-CAPILLARY: 146 mg/dL — AB (ref 65–99)
GLUCOSE-CAPILLARY: 143 mg/dL — AB (ref 65–99)
GLUCOSE-CAPILLARY: 155 mg/dL — AB (ref 65–99)
GLUCOSE-CAPILLARY: 146 mg/dL — AB (ref 65–99)
Glucose-Capillary: 123 mg/dL — ABNORMAL HIGH (ref 65–99)

## 2017-10-01 SURGERY — COLONOSCOPY
Anesthesia: Monitor Anesthesia Care

## 2017-10-01 MED ORDER — PROPOFOL 500 MG/50ML IV EMUL
INTRAVENOUS | Status: DC | PRN
  Administered 2017-10-01: 50 ug/kg/min via INTRAVENOUS

## 2017-10-01 MED ORDER — LACTATED RINGERS IV SOLN
INTRAVENOUS | Status: DC | PRN
  Administered 2017-10-01: 09:00:00 via INTRAVENOUS

## 2017-10-01 MED ORDER — PANTOPRAZOLE SODIUM 40 MG PO TBEC
40.0000 mg | DELAYED_RELEASE_TABLET | Freq: Two times a day (BID) | ORAL | Status: DC
  Administered 2017-10-01 – 2017-10-03 (×4): 40 mg via ORAL
  Filled 2017-10-01 (×4): qty 1

## 2017-10-01 MED ORDER — DEXTROSE 5 % IV SOLN
INTRAVENOUS | Status: DC | PRN
  Administered 2017-10-01: 40 ug/min via INTRAVENOUS

## 2017-10-01 MED ORDER — PROPOFOL 10 MG/ML IV BOLUS
INTRAVENOUS | Status: DC | PRN
  Administered 2017-10-01: 25 mg via INTRAVENOUS
  Administered 2017-10-01: 50 mg via INTRAVENOUS

## 2017-10-01 NOTE — Progress Notes (Addendum)
Dr. Garrel Ridgel made aware with the large black/red BM of patient few minutes after the 1st bowel prep was done, Hgb=10.3 at 1746 and the Q8 schedule of H & H.  Patient completed the 2nd bowel prep per family request around 2230 and tolerated well the procedure.  Will monitor.  Hbg=10.5 at 2238

## 2017-10-01 NOTE — Anesthesia Postprocedure Evaluation (Signed)
Anesthesia Post Note  Patient: Anthony Murray  Procedure(s) Performed: COLONOSCOPY (N/A )     Patient location during evaluation: PACU Anesthesia Type: MAC Level of consciousness: awake and alert Pain management: pain level controlled Vital Signs Assessment: post-procedure vital signs reviewed and stable Respiratory status: spontaneous breathing, nonlabored ventilation and respiratory function stable Cardiovascular status: stable and blood pressure returned to baseline Postop Assessment: no apparent nausea or vomiting Anesthetic complications: no    Last Vitals:  Vitals:   10/01/17 0916 10/01/17 0949  BP: (!) 132/50 136/75  Pulse:  (!) 107  Resp: 18 16  Temp:  36.9 C  SpO2: 96% 96%    Last Pain:  Vitals:   10/01/17 0949  TempSrc: Oral  PainSc:                  Nakya Weyand,W. EDMOND

## 2017-10-01 NOTE — Op Note (Signed)
[facility] Patient Name: Anthony Murray Procedure Date : 10/01/2017 MRN: [id number] Attending MD: Jackquline Denmark MD, MD Date of Birth: [birth year] CSN: [id number] Age: 84 Admit Type: Inpatient Procedure:                Colonoscopy Indications:              Painless hematochezia Providers:                Jackquline Denmark MD, MD, Kingsley Plan, RN, Cherylynn Ridges, Technician, Izora Gala, CRNA Referring MD:             [facility] Medicines:                Monitored Anesthesia Care Complications:            No immediate complications. Estimated Blood Loss:     Estimated blood loss: none. Procedure:                Pre-Anesthesia Assessment:                           - Prior to the procedure, a History and Physical                            was performed, and patient medications and                            allergies were reviewed. The patient is competent.                            The risks and benefits of the procedure and the                            sedation options and risks were discussed with the                            patient. All questions were answered and informed                            consent was obtained. Patient identification and                            proposed procedure were verified by the physician                            in the pre-procedure area in the procedure room.                            Mental Status Examination: alert and oriented.                            Prophylactic Antibiotics: The patient does not  require prophylactic antibiotics. Prior                            Anticoagulants: The patient has taken no previous                            anticoagulant or antiplatelet agents. ASA Grade                            Assessment: III - A patient with severe systemic                            disease. After reviewing the risks and benefits,   the patient was deemed in satisfactory condition to                            undergo the procedure. The anesthesia plan was to                            use monitored anesthesia care (MAC). Immediately                            prior to administration of medications, the patient                            was re-assessed for adequacy to receive sedatives.                            The heart rate, respiratory rate, oxygen                            saturations, blood pressure, adequacy of pulmonary                            ventilation, and response to care were monitored                            throughout the procedure. The physical status of                            the patient was re-assessed after the procedure.                           After obtaining informed consent, the adult                            colonoscope was passed under direct vision. the                            scope malfunctioned, it was switched to video                            pediatric colonoscope Throughout the procedure, the  patient's blood pressure, pulse, and oxygen                            saturations were monitored continuously. The                            EC-[id number] ([id number]) scope was introduced through                            the anus and advanced to the 2 cm into the ileum.                            The colonoscopy was somewhat difficult due to poor                            bowel prep due to retained clots and solid stool.                            Successful completion of the procedure was aided by                            lavage. The patient tolerated the procedure well.                            The quality of the bowel preparation was poor.                            Approximately 70-75% of the colonic mucosa was                            visualized satisfactorily. Scope In: 8:16:02 AM Scope Out: 8:57:30 AM Scope Withdrawal Time: 0 hours 34  minutes 43 seconds  Total Procedure Duration: 0 hours 41 minutes 28 seconds  Findings:      The perianal and digital rectal examinations were normal.      Multiple small-mouthed diverticula were found in the sigmoid colon,       descending colon and ascending colon. Old blood with clots were noted       throughout the colon. The examination of the colon was limited due to       presence of stool and clots. Approximately 70-75% of the colonic mucosa       was visualized satisfactorily. There was no active bleeding.      The terminal ileum appeared normal. No blood in the terminal ileum. Impression:               - Likely diverticular bleed - appears to have                            resolved.                           - pancolonic diverticulosis.                           - Normal ileum without blood.                           -  No specimens collected. Recommendation:           - Return patient to hospital ward for ongoing care.                           - Full liquid diet.                           - Continue present medications.                           - Repeat colonoscopy in 6 months because the bowel                            preparation was poor due to retained blood clots                            and food..                           - Return to GI office in 4 weeks. Procedure Code(s):        --- Professional ---                           [phone], Colonoscopy, flexible; diagnostic, including                            collection of specimen(s) by brushing or washing,                            when performed (separate procedure) Diagnosis Code(s):        --- Professional ---                           K92.2, Gastrointestinal hemorrhage, unspecified                           K57.30, Diverticulosis of large intestine without                            perforation or abscess without bleeding CPT copyright 2017 American Medical Association. All rights reserved. The codes documented  in this report are preliminary and upon coder review may  be revised to meet current compliance requirements. Jackquline Denmark MD, MD 10/01/2017 10:26:20 AM This report has been signed electronically. Number of Addenda: 0

## 2017-10-01 NOTE — Transfer of Care (Signed)
Immediate Anesthesia Transfer of Care Note  Patient: Anthony Murray  Procedure(s) Performed: COLONOSCOPY (N/A )  Patient Location: PACU  Anesthesia Type:MAC  Level of Consciousness: awake  Airway & Oxygen Therapy: Patient Spontanous Breathing  Post-op Assessment: Report given to RN, Post -op Vital signs reviewed and stable, Patient moving all extremities and Patient moving all extremities X 4  Post vital signs: Reviewed and stable  Last Vitals:  Vitals Value Taken Time  BP 102/39 10/01/2017  8:59 AM  Temp 36.9 C 10/01/2017  8:58 AM  Pulse 37 10/01/2017  8:59 AM  Resp 31 10/01/2017  9:00 AM  SpO2 94 % 10/01/2017  8:59 AM  Vitals shown include unvalidated device data.  Last Pain:  Vitals:   10/01/17 0858  TempSrc:   PainSc: 0-No pain         Complications: No apparent anesthesia complications

## 2017-10-01 NOTE — Interval H&P Note (Signed)
History and Physical Interval Note:  10/01/2017 8:01 AM  Anthony Murray  has presented today for surgery, with the diagnosis of rectal bleeding  The various methods of treatment have been discussed with the patient and family. After consideration of risks, benefits and other options for treatment, the patient has consented to  Procedure(s): COLONOSCOPY (N/A) as a surgical intervention .  The patient's history has been reviewed, patient examined, no change in status, stable for surgery.  I have reviewed the patient's chart and labs.  Questions were answered to the patient's satisfaction.     Jackquline Denmark

## 2017-10-01 NOTE — Anesthesia Preprocedure Evaluation (Addendum)
Anesthesia Evaluation  Patient identified by MRN, date of birth, ID band Patient awake    Reviewed: Allergy & Precautions, H&P , NPO status , Patient's Chart, lab work & pertinent test results, reviewed documented beta blocker date and time   Airway Mallampati: II  TM Distance: >3 FB Neck ROM: Full    Dental no notable dental hx. (+) Edentulous Upper, Edentulous Lower, Dental Advisory Given   Pulmonary neg pulmonary ROS, former smoker,    Pulmonary exam normal breath sounds clear to auscultation       Cardiovascular hypertension, Pt. on medications and Pt. on home beta blockers + Valvular Problems/Murmurs AS  Rhythm:Regular Rate:Normal + Systolic murmurs    Neuro/Psych negative psych ROS   GI/Hepatic negative GI ROS, Neg liver ROS,   Endo/Other  diabetes, Type 2, Oral Hypoglycemic Agents  Renal/GU Renal InsufficiencyRenal disease  negative genitourinary   Musculoskeletal   Abdominal   Peds  Hematology negative hematology ROS (+)   Anesthesia Other Findings   Reproductive/Obstetrics negative OB ROS                           Anesthesia Physical Anesthesia Plan  ASA: III  Anesthesia Plan: MAC   Post-op Pain Management:    Induction: Intravenous  PONV Risk Score and Plan: 1 and Propofol infusion  Airway Management Planned: Simple Face Mask  Additional Equipment:   Intra-op Plan:   Post-operative Plan:   Informed Consent: I have reviewed the patients History and Physical, chart, labs and discussed the procedure including the risks, benefits and alternatives for the proposed anesthesia with the patient or authorized representative who has indicated his/her understanding and acceptance.   Dental advisory given  Plan Discussed with: CRNA  Anesthesia Plan Comments:         Anesthesia Quick Evaluation

## 2017-10-01 NOTE — Progress Notes (Signed)
PROGRESS NOTE        PATIENT DETAILS Name: Anthony Murray Age: 84 y.o. Sex: male Date of Birth: 12/30/1927 Admit Date: 09/29/2017 Admitting Physician Quintella Baton, MD BJS:EGBT, Edwinna Areola, MD  Brief Narrative: Patient is a 84 y.o. male with prior history of DM-2, hypertension, moderate aortic stenosis-recent history of culture negative aortic valve endocarditis with septic emboli to the brain-presented to the ED-for evaluation of hematochezia.  Subjective: No new complaints.  No further bleeding reported.  Patient is improving slowly.  Assessment/Plan: Lower GI bleeding:  Likely diverticulosis.   No further bleeding. Continue to monitor H/H. Colonoscopy revealed - "Multiple small-mouthed diverticula were found in the sigmoid colon, descending colon and ascending colon. Old blood with clots were noted throughout the colon. The examination of the colon was limited due to presence of stool and clots. Approximately 70-75% of the colonic mucosa was visualized satisfactorily. There was no active bleeding. The terminal ileum appeared normal. No blood in the terminal ileum. - Likely diverticular bleed - appears to have resolved. - pancolonic diverticulosis. - Normal ileum without blood". GI has advised full liquid diet.  Anemia secondary to acute blood loss: Hemoglobin is 8.8 today, down from 11.9 on 09/30/2017. Continue to monitor hemoglobin level. No further bleeding reported.    Recent history of culture negative aortic valve endocarditis with septic emboli:  Completed a course of IV antimicrobial therapy.  Hypertension:  Continue to monitor  Continue current regimen.    Moderate aortic stenosis:  Stable for outpatient follow-up.  DM-2:  Monitor and optimize.    Chronic kidney disease stage III:  Stable. Check renal panel in a.m.    BPH:  Continue Flomax  COPD:  Stable-clear to auscultation-continue inhaler regimen  DVT Prophylaxis: Full  dose anticoagulation   Code Status: Full code   Family Communication: Daughter at bedside  Disposition Plan: Likely DC in the next 1-2 days if no further bleeding is noted.  Antimicrobial agents: Anti-infectives (From admission, onward)   None      Procedures: Colonoscopy  Please see above  CONSULTS:  GI  Time spent: Greater than 25 minutes.   MEDICATIONS: Scheduled Meds: . carvedilol  12.5 mg Oral BID WC  . fluticasone furoate-vilanterol  1 puff Inhalation Daily  . insulin aspart  0-15 Units Subcutaneous Q4H  . insulin aspart  0-5 Units Subcutaneous QHS  . Netarsudil Dimesylate  1 drop Left Eye QHS  . oxybutynin  5 mg Oral BID  . pantoprazole (PROTONIX) IV  40 mg Intravenous Q12H  . pilocarpine  1 drop Left Eye QID  . tamsulosin  0.4 mg Oral Daily  . umeclidinium bromide  1 puff Inhalation Daily   Continuous Infusions: . sodium chloride Stopped (10/01/17 0830)   PRN Meds:.ondansetron **OR** ondansetron (ZOFRAN) IV   PHYSICAL EXAM: Vital signs: Vitals:   10/01/17 0916 10/01/17 0949 10/01/17 1152 10/01/17 1246  BP: (!) 132/50 136/75  111/75  Pulse:  (!) 107  (!) 101  Resp: 18 16  18   Temp:  98.4 F (36.9 C)  99.1 F (37.3 C)  TempSrc:  Oral  Oral  SpO2: 96% 96% 99% 98%  Weight:      Height:       Filed Weights   09/29/17 2152 10/01/17 0727  Weight: 88.9 kg (196 lb) 88.9 kg (196 lb)   Body mass index is  30.7 kg/m.   General appearance : Awake and alert.  Patient is not in any obvious distress.   HEENT: Pallor, and no jaundice.   Neck: supple, no JVD.   Resp:Good air entry bilaterally, no added sounds  CVS: S1 S2  GI: Soft and nontender.  Organs are nonpalpable.  Bowel sounds are present  Extremities: No leg edema.   Neurology: Awake, alert and oriented to time, place and person.  Patient moves all limbs.   I have personally reviewed following labs and imaging studies  LABORATORY DATA: CBC: Recent Labs  Lab 09/30/17 0105 09/30/17 0459  09/30/17 1746 09/30/17 2238 10/01/17 0633  WBC 8.1 6.3 8.1  --   --   NEUTROABS 6.2  --   --   --   --   HGB 13.8 11.9* 10.3* 10.5* 10.1*  HCT 42.4 36.5* 31.8* 33.1* 31.6*  MCV 92.4 91.9 91.6  --   --   PLT 170 155 152  --   --     Basic Metabolic Panel: Recent Labs  Lab 09/29/17 2221 09/30/17 0105 09/30/17 0459  NA 139 138 139  K 3.7 3.7 3.9  CL 105 103 106  CO2  --  23 23  GLUCOSE 204* 195* 173*  BUN 28* 27* 30*  CREATININE 1.80* 1.80* 1.71*  CALCIUM  --  8.6* 8.2*    GFR: Estimated Creatinine Clearance: 31.8 mL/min (A) (by C-G formula based on SCr of 1.71 mg/dL (H)).  Liver Function Tests: Recent Labs  Lab 09/30/17 0105  AST 22  ALT 27  ALKPHOS 63  BILITOT 0.7  PROT 6.4*  ALBUMIN 3.5   No results for input(s): LIPASE, AMYLASE in the last 168 hours. No results for input(s): AMMONIA in the last 168 hours.  Coagulation Profile: Recent Labs  Lab 09/30/17 0105  INR 1.03    Cardiac Enzymes: No results for input(s): CKTOTAL, CKMB, CKMBINDEX, TROPONINI in the last 168 hours.  BNP (last 3 results) No results for input(s): PROBNP in the last 8760 hours.  HbA1C: No results for input(s): HGBA1C in the last 72 hours.  CBG: Recent Labs  Lab 09/30/17 1954 09/30/17 2350 10/01/17 0413 10/01/17 0737 10/01/17 1154  GLUCAP 194* 152* 146* 123* 143*    Lipid Profile: No results for input(s): CHOL, HDL, LDLCALC, TRIG, CHOLHDL, LDLDIRECT in the last 72 hours.  Thyroid Function Tests: No results for input(s): TSH, T4TOTAL, FREET4, T3FREE, THYROIDAB in the last 72 hours.  Anemia Panel: No results for input(s): VITAMINB12, FOLATE, FERRITIN, TIBC, IRON, RETICCTPCT in the last 72 hours.  Urine analysis:    Component Value Date/Time   COLORURINE YELLOW 07/27/2017 0718   APPEARANCEUR CLEAR 07/27/2017 0718   LABSPEC 1.013 07/27/2017 0718   PHURINE 8.0 07/27/2017 0718   GLUCOSEU NEGATIVE 07/27/2017 0718   HGBUR NEGATIVE 07/27/2017 0718   BILIRUBINUR  NEGATIVE 07/27/2017 0718   KETONESUR NEGATIVE 07/27/2017 0718   PROTEINUR >=300 (A) 07/27/2017 0718   NITRITE NEGATIVE 07/27/2017 0718   LEUKOCYTESUR NEGATIVE 07/27/2017 0718    Sepsis Labs: Lactic Acid, Venous    Component Value Date/Time   LATICACIDVEN 1.4 07/27/2017 1242    MICROBIOLOGY: No results found for this or any previous visit (from the past 240 hour(s)).  RADIOLOGY STUDIES/RESULTS: No results found.   LOS: 1 day   Bonnell Public, MD  [facility] Pager:[phone]  If 7PM-7AM, please contact night-coverage [email or web address] Password [facility] 10/01/2017, 12:59 PM

## 2017-10-01 NOTE — Progress Notes (Signed)
1030 Received pt from Endo post Colonoscopy. Denies pain. Restarted diet to full liquids, tolerated. No rectal bleeding noted today.

## 2017-10-01 NOTE — Anesthesia Procedure Notes (Signed)
Procedure Name: MAC Date/Time: 10/01/2017 8:12 AM Performed by: Izora Gala, CRNA Pre-anesthesia Checklist: Patient identified, Emergency Drugs available, Suction available and Patient being monitored Patient Re-evaluated:Patient Re-evaluated prior to induction Oxygen Delivery Method: Simple face mask Preoxygenation: Pre-oxygenation with 100% oxygen Induction Type: IV induction Placement Confirmation: positive ETCO2

## 2017-10-02 DIAGNOSIS — I679 Cerebrovascular disease, unspecified: Secondary | ICD-10-CM

## 2017-10-02 DIAGNOSIS — N183 Chronic kidney disease, stage 3 (moderate): Secondary | ICD-10-CM

## 2017-10-02 DIAGNOSIS — K625 Hemorrhage of anus and rectum: Secondary | ICD-10-CM

## 2017-10-02 DIAGNOSIS — Z933 Colostomy status: Secondary | ICD-10-CM

## 2017-10-02 DIAGNOSIS — D62 Acute posthemorrhagic anemia: Secondary | ICD-10-CM

## 2017-10-02 DIAGNOSIS — K5731 Diverticulosis of large intestine without perforation or abscess with bleeding: Principal | ICD-10-CM

## 2017-10-02 LAB — RENAL FUNCTION PANEL
Albumin: 3 g/dL — ABNORMAL LOW (ref 3.5–5.0)
Anion gap: 7 (ref 5–15)
BUN: 24 mg/dL — ABNORMAL HIGH (ref 6–20)
CO2: 24 mmol/L (ref 22–32)
Calcium: 8.2 mg/dL — ABNORMAL LOW (ref 8.9–10.3)
Chloride: 111 mmol/L (ref 101–111)
Creatinine, Ser: 1.69 mg/dL — ABNORMAL HIGH (ref 0.61–1.24)
GFR calc Af Amer: 40 mL/min — ABNORMAL LOW (ref >60)
GFR calc non Af Amer: 34 mL/min — ABNORMAL LOW (ref >60)
Glucose, Bld: 151 mg/dL — ABNORMAL HIGH (ref 65–99)
Phosphorus: 2.9 mg/dL (ref 2.5–4.6)
Potassium: 3.5 mmol/L (ref 3.5–5.1)
Sodium: 142 mmol/L (ref 135–145)

## 2017-10-02 LAB — HEMOGLOBIN AND HEMATOCRIT, BLOOD
HCT: 26 % — ABNORMAL LOW (ref 39.0–52.0)
HEMATOCRIT: 24.8 % — AB (ref 39.0–52.0)
HEMOGLOBIN: 8.1 g/dL — AB (ref 13.0–17.0)
Hemoglobin: 8.4 g/dL — ABNORMAL LOW (ref 13.0–17.0)

## 2017-10-02 LAB — GLUCOSE, CAPILLARY
GLUCOSE-CAPILLARY: 135 mg/dL — AB (ref 65–99)
GLUCOSE-CAPILLARY: 137 mg/dL — AB (ref 65–99)
GLUCOSE-CAPILLARY: 144 mg/dL — AB (ref 65–99)
Glucose-Capillary: 127 mg/dL — ABNORMAL HIGH (ref 65–99)
Glucose-Capillary: 147 mg/dL — ABNORMAL HIGH (ref 65–99)
Glucose-Capillary: 153 mg/dL — ABNORMAL HIGH (ref 65–99)
Glucose-Capillary: 161 mg/dL — ABNORMAL HIGH (ref 65–99)

## 2017-10-02 NOTE — Progress Notes (Addendum)
Patient ID: Anthony Murray, male   DOB: Apr 27, 1928, 84 y.o.   MRN: [id number]    Progress Note   Subjective   Eating lunch, hungry - no complaints - no BM or bleeding since Colonoscopy yesterday Hgb 8.4 stable   Objective   Vital signs in last 24 hours: Temp:  [98.7 F (37.1 C)-99.1 F (37.3 C)] 98.7 F (37.1 C) (04/14 1314) Pulse Rate:  [70-79] 71 (04/14 1314) Resp:  [16-20] 19 (04/14 1314) BP: (101-120)/(55-70) 120/70 (04/14 1314) SpO2:  [98 %-100 %] 100 % (04/14 1314) Last BM Date: 09/30/17 General:    Elderly male  in NAD, up in chair Heart:  Regular rate and rhythm; no murmurs Lungs: Respirations even and unlabored, lungs CTA bilaterally Abdomen:  Soft, nontender and nondistended. Normal bowel sounds. Extremities:  Without edema. Neurologic:  Alert and oriented,  grossly normal neurologically. Psych:  Cooperative. Normal mood and affect.  Intake/Output from previous day: 04/13 0701 - 04/14 0700 In: 1220 [P.O.:720; I.V.:500] Out: 300 [Urine:300] Intake/Output this shift: No intake/output data recorded.  Lab Results: Recent Labs    09/30/17 0105 09/30/17 0459 09/30/17 1746  10/01/17 1513 10/01/17 2325 10/02/17 0741  WBC 8.1 6.3 8.1  --   --   --   --   HGB 13.8 11.9* 10.3*   < > 8.8* 7.7* 8.4*  HCT 42.4 36.5* 31.8*   < > 27.6* 23.6* 26.0*  PLT 170 155 152  --   --   --   --    < > = values in this interval not displayed.   BMET Recent Labs    09/30/17 0105 09/30/17 0459 10/02/17 0741  NA 138 139 142  K 3.7 3.9 3.5  CL 103 106 111  CO2 23 23 24   GLUCOSE 195* 173* 151*  BUN 27* 30* 24*  CREATININE 1.80* 1.71* 1.69*  CALCIUM 8.6* 8.2* 8.2*   LFT Recent Labs    09/30/17 0105 10/02/17 0741  PROT 6.4*  --   ALBUMIN 3.5 3.0*  AST 22  --   ALT 27  --   ALKPHOS 63  --   BILITOT 0.7  --    PT/INR Recent Labs    09/30/17 0105  LABPROT 13.4  INR 1.03       Assessment / Plan:    #64 84 year old white male with acute lower GI bleed felt  secondary to diverticular hemorrhage, resolved. Patient had pan colonic diverticulosis on colonoscopy yesterday- prep was poor and repeat colonoscopy in 6 months suggested  #2 anemia secondary to acute blood loss-stable today #3 history of adenomatous colon polyps 2011 For history of moderate to severe aortic stenosis-story of endocarditis treated February 2019 #5 COPD 6.  Coronary artery disease #7 chronic kidney disease #8 GERD  Plan; will advance diet to soft today Follow-up hemoglobin in a.m. If hemoglobin is stable, patient can be discharged tomorrow from GI standpoint. Our office will contact him for follow-up appointment with Dr. Lyndel Safe, and repeat colonoscopy can be discussed at that time.       Contact  Amy [place], P.A.-C               [phone]   Attending physician's note   I have taken an interval history, reviewed the chart and examined the patient. I agree with the Advanced Practitioner's note, impression and recommendations.  84 year old with a diverticular bleed. Colonoscopy showing pancolonic diverticulosis. The quality of preparation was poor. Would recommend repeating colonoscopy in 6 months after  a 2 day preparation. Recheck hemoglobin in a.m. If stable, can be discharged home.   Carmell Austria, MD

## 2017-10-02 NOTE — Progress Notes (Signed)
PROGRESS NOTE  Anthony Murray HBZ:[id number] DOB: [birth year] DOA: 09/29/2017 PCP: [name], MD  HPI/Recap of past 24 hours: this is a 84 y/o male who presents with c/o blood in stool, BRBPR.thetv are unsure if the event repeated. Last colonoscopy prob 10-20 years ago. He has a h/o of colectomy secondary to doiverticulosis. He denies any significant abdominal pain, diarrhea or constipation. He does not use NSAID's, eat spicy foods. He does report GERD. No nausea, no vomiting. His wife called their daughter who brought him to the ER. History n provided mainly by the patient, and daughter present at bedside.  10/02/17: Patient seen and examined at his bedside.  He denies having any bright red blood per rectum or melena today had no bowel movements today.  Patient is POD #1 post colonoscopy.  His hemoglobin is stable at 8.4.  GI is following advance diet to soft today.  If hemoglobin still stable tomorrow will discharge.  Assessment/Plan: Principal Problem:   GI bleed Active Problems:   HLD (hyperlipidemia)   Essential hypertension   CEREBROVASCULAR DISEASE   Type 2 diabetes mellitus with complications (HCC)   CKD (chronic kidney disease), stage III (HCC)   Peripheral vascular disease (HCC)   Colostomy in place [facility])  Acute lower GI bleed secondary to diverticular hemorrhage Resolved Post colonoscopy 10/01/2017 GI following Advancing diet from liquid to soft  Hemoglobin stable at 8.4 Continue to monitor for signs of bleeding Repeat CBC in the morning If hemoglobin stable in the morning will discharge to home  Acute blood loss anemia Secondary to diverticular bleeding Management as stated above  CKD 3 Stable Avoid nephrotoxic agent Repeat BMP in the morning  BPH Continue Flomax  COPD Stable Continue home regimen   Code Status: Full code  Family Communication: None at bedside  Disposition Plan: Home when clinically  stable   Consultants:  GI  Procedures:  Colonoscopy Antimicrobials:  None  DVT prophylaxis:  SCDs   Objective: Vitals:   10/01/17 1658 10/01/17 2128 10/02/17 0518 10/02/17 1314  BP: 109/62 (!) 101/55 115/64 120/70  Pulse: 70 79 71 71  Resp: 18 20 16 19   Temp: 99.1 F (37.3 C) 98.9 F (37.2 C) 98.9 F (37.2 C) 98.7 F (37.1 C)  TempSrc: Oral Oral Oral Oral  SpO2: 98% 98% 100% 100%  Weight:      Height:        Intake/Output Summary (Last 24 hours) at 10/02/2017 1620 Last data filed at 10/02/2017 0315 Gross per 24 hour  Intake 240 ml  Output 300 ml  Net -60 ml   Filed Weights   09/29/17 2152 10/01/17 0727  Weight: 88.9 kg (196 lb) 88.9 kg (196 lb)    Exam:   General: 84 year old Caucasian male well-developed well-nourished no acute distress.  Alert oriented x3.  Cardiovascular: Regular rate and rhythm with no rubs or gallops.  No JVD or thyromegaly present.  Respiratory: Clear to auscultation with no wheezes or rales.  Abdomen: Soft nontender nondistended normal bowel sounds x4 Musculoskeletal: no lower extremity edema  Skin: No ulcerative lesions or rashes  Psychiatry: Mood is appropriate for condition and setting   Data Reviewed: CBC: Recent Labs  Lab 09/30/17 0105 09/30/17 0459 09/30/17 1746  10/01/17 0633 10/01/17 1513 10/01/17 2325 10/02/17 0741 10/02/17 1544  WBC 8.1 6.3 8.1  --   --   --   --   --   --   NEUTROABS 6.2  --   --   --   --   --   --   --   --  HGB 13.8 11.9* 10.3*   < > 10.1* 8.8* 7.7* 8.4* 8.1*  HCT 42.4 36.5* 31.8*   < > 31.6* 27.6* 23.6* 26.0* 24.8*  MCV 92.4 91.9 91.6  --   --   --   --   --   --   PLT 170 155 152  --   --   --   --   --   --    < > = values in this interval not displayed.   Basic Metabolic Panel: Recent Labs  Lab 09/29/17 2221 09/30/17 0105 09/30/17 0459 10/02/17 0741  NA 139 138 139 142  K 3.7 3.7 3.9 3.5  CL 105 103 106 111  CO2  --  23 23 24   GLUCOSE 204* 195* 173* 151*  BUN 28*  27* 30* 24*  CREATININE 1.80* 1.80* 1.71* 1.69*  CALCIUM  --  8.6* 8.2* 8.2*  PHOS  --   --   --  2.9   GFR: Estimated Creatinine Clearance: 32.1 mL/min (A) (by C-G formula based on SCr of 1.69 mg/dL (H)). Liver Function Tests: Recent Labs  Lab 09/30/17 0105 10/02/17 0741  AST 22  --   ALT 27  --   ALKPHOS 63  --   BILITOT 0.7  --   PROT 6.4*  --   ALBUMIN 3.5 3.0*   No results for input(s): LIPASE, AMYLASE in the last 168 hours. No results for input(s): AMMONIA in the last 168 hours. Coagulation Profile: Recent Labs  Lab 09/30/17 0105  INR 1.03   Cardiac Enzymes: No results for input(s): CKTOTAL, CKMB, CKMBINDEX, TROPONINI in the last 168 hours. BNP (last 3 results) No results for input(s): PROBNP in the last 8760 hours. HbA1C: No results for input(s): HGBA1C in the last 72 hours. CBG: Recent Labs  Lab 10/01/17 2026 10/02/17 0015 10/02/17 0420 10/02/17 0759 10/02/17 1200  GLUCAP 146* 135* 144* 137* 147*   Lipid Profile: No results for input(s): CHOL, HDL, LDLCALC, TRIG, CHOLHDL, LDLDIRECT in the last 72 hours. Thyroid Function Tests: No results for input(s): TSH, T4TOTAL, FREET4, T3FREE, THYROIDAB in the last 72 hours. Anemia Panel: No results for input(s): VITAMINB12, FOLATE, FERRITIN, TIBC, IRON, RETICCTPCT in the last 72 hours. Urine analysis:    Component Value Date/Time   COLORURINE YELLOW 07/27/2017 0718   APPEARANCEUR CLEAR 07/27/2017 0718   LABSPEC 1.013 07/27/2017 0718   PHURINE 8.0 07/27/2017 0718   GLUCOSEU NEGATIVE 07/27/2017 0718   HGBUR NEGATIVE 07/27/2017 0718   BILIRUBINUR NEGATIVE 07/27/2017 0718   KETONESUR NEGATIVE 07/27/2017 0718   PROTEINUR >=300 (A) 07/27/2017 0718   NITRITE NEGATIVE 07/27/2017 0718   LEUKOCYTESUR NEGATIVE 07/27/2017 0718   Sepsis Labs: @LABRCNTIP (procalcitonin:4,lacticidven:4)  )No results found for this or any previous visit (from the past 240 hour(s)).    Studies: No results found.  Scheduled Meds: .  carvedilol  12.5 mg Oral BID WC  . fluticasone furoate-vilanterol  1 puff Inhalation Daily  . insulin aspart  0-15 Units Subcutaneous Q4H  . insulin aspart  0-5 Units Subcutaneous QHS  . oxybutynin  5 mg Oral BID  . pantoprazole  40 mg Oral BID  . pilocarpine  1 drop Left Eye QID  . tamsulosin  0.4 mg Oral Daily  . umeclidinium bromide  1 puff Inhalation Daily    Continuous Infusions: . sodium chloride 50 mL/hr at 10/02/17 0300     LOS: 2 days     Kayleen Memos, MD [facility] Pager [phone]  If 7PM-7AM, please  contact night-coverage [email or web address] Password [email or web address] 10/02/2017, 4:20 PM

## 2017-10-03 ENCOUNTER — Ambulatory Visit: Payer: PPO | Admitting: Internal Medicine

## 2017-10-03 ENCOUNTER — Telehealth: Payer: Self-pay

## 2017-10-03 LAB — BASIC METABOLIC PANEL
ANION GAP: 8 (ref 5–15)
BUN: 18 mg/dL (ref 6–20)
CO2: 23 mmol/L (ref 22–32)
Calcium: 8.3 mg/dL — ABNORMAL LOW (ref 8.9–10.3)
Chloride: 108 mmol/L (ref 101–111)
Creatinine, Ser: 1.57 mg/dL — ABNORMAL HIGH (ref 0.61–1.24)
GFR calc non Af Amer: 38 mL/min — ABNORMAL LOW (ref >60)
GFR, EST AFRICAN AMERICAN: 44 mL/min — AB (ref >60)
GLUCOSE: 158 mg/dL — AB (ref 65–99)
POTASSIUM: 3.8 mmol/L (ref 3.5–5.1)
Sodium: 139 mmol/L (ref 135–145)

## 2017-10-03 LAB — GLUCOSE, CAPILLARY
GLUCOSE-CAPILLARY: 156 mg/dL — AB (ref 65–99)
GLUCOSE-CAPILLARY: 137 mg/dL — AB (ref 65–99)
Glucose-Capillary: 162 mg/dL — ABNORMAL HIGH (ref 65–99)
Glucose-Capillary: 139 mg/dL — ABNORMAL HIGH (ref 65–99)

## 2017-10-03 LAB — CBC
HCT: 27.2 % — ABNORMAL LOW (ref 39.0–52.0)
HEMOGLOBIN: 8.7 g/dL — AB (ref 13.0–17.0)
MCH: 30.1 pg (ref 26.0–34.0)
MCHC: 32 g/dL (ref 30.0–36.0)
MCV: 94.1 fL (ref 78.0–100.0)
Platelets: 146 10*3/uL — ABNORMAL LOW (ref 150–400)
RBC: 2.89 MIL/uL — AB (ref 4.22–5.81)
RDW: 16 % — ABNORMAL HIGH (ref 11.5–15.5)
WBC: 8.7 10*3/uL (ref 4.0–10.5)

## 2017-10-03 MED ORDER — POLYETHYLENE GLYCOL 3350 17 G PO PACK: 17.0000 g | PACK | Freq: Every day | ORAL | 0 refills | Status: AC

## 2017-10-03 MED ORDER — ASPIRIN 81 MG PO TABS: 81.0000 mg | ORAL_TABLET | Freq: Every day | ORAL | 0 refills | Status: AC

## 2017-10-03 MED ORDER — IPRATROPIUM-ALBUTEROL 0.5-2.5 (3) MG/3ML IN SOLN: 3.0000 mL | RESPIRATORY_TRACT | 0 refills | Status: DC | PRN

## 2017-10-03 MED ORDER — INSULIN ASPART 100 UNIT/ML ~~LOC~~ SOLN: 0.0000 [IU] | Freq: Every day | SUBCUTANEOUS | Status: DC

## 2017-10-03 MED ORDER — POLYETHYLENE GLYCOL 3350 17 G PO PACK
17.0000 g | PACK | Freq: Every day | ORAL | Status: DC
  Administered 2017-10-03: 17 g via ORAL
  Filled 2017-10-03: qty 1

## 2017-10-03 MED ORDER — PANTOPRAZOLE SODIUM 40 MG PO TBEC: 40.0000 mg | DELAYED_RELEASE_TABLET | Freq: Two times a day (BID) | ORAL | 0 refills | Status: AC

## 2017-10-03 MED ORDER — INSULIN ASPART 100 UNIT/ML ~~LOC~~ SOLN
0.0000 [IU] | Freq: Three times a day (TID) | SUBCUTANEOUS | Status: DC
  Administered 2017-10-03: 1 [IU] via SUBCUTANEOUS

## 2017-10-03 MED ORDER — GLIPIZIDE ER 5 MG PO TB24: 5.0000 mg | ORAL_TABLET | Freq: Every day | ORAL | 0 refills | Status: DC

## 2017-10-03 MED ORDER — IPRATROPIUM-ALBUTEROL 0.5-2.5 (3) MG/3ML IN SOLN
3.0000 mL | RESPIRATORY_TRACT | Status: DC | PRN
  Administered 2017-10-03: 3 mL via RESPIRATORY_TRACT
  Filled 2017-10-03: qty 3

## 2017-10-03 NOTE — Telephone Encounter (Signed)
-----   Message from Anthony Craze, NP sent at 10/03/2017 11:44 AM EDT ----- Good morning,  Will you put this patient on for a colon recall for 6 months. I should see him first in office in about 5 months to discuss. Thanks  PG

## 2017-10-03 NOTE — Telephone Encounter (Signed)
Recalls for office visit entered into Epic.

## 2017-10-03 NOTE — Progress Notes (Signed)
Discharge instructions reviewed with the Pt and his daughter. Discharge instructions given to his daughter at discharge.  Pt's daughter and grandson will be staying with them during the nights, and will have family members staying during the days.  Pt has [facility] to come out for [facility] PT/OT. Pt was educated on preventing falls and needing assistance when getting up and not being alone at home and potential injury. Pt insisted on going home and understands the risks.

## 2017-10-03 NOTE — Discharge Instructions (Signed)
Gastrointestinal Bleeding °Gastrointestinal bleeding is bleeding somewhere along the path food travels through the body (digestive tract). This path is anywhere between the mouth and the opening of the butt (anus). You may have blood in your poop (stools) or have black poop. If you throw up (vomit), there may be blood in it. °This condition can be mild, serious, or even life-threatening. If you have a lot of bleeding, you may need to stay in the hospital. °Follow these instructions at home: °· Take over-the-counter and prescription medicines only as told by your doctor. °· Eat foods that have a lot of fiber in them. These foods include whole grains, fruits, and vegetables. You can also try eating 1-3 prunes each day. °· Drink enough fluid to keep your pee (urine) clear or pale yellow. °· Keep all follow-up visits as told by your doctor. This is important. °Contact a doctor if: °· Your symptoms do not get better. °Get help right away if: °· Your bleeding gets worse. °· You feel dizzy or you pass out (faint). °· You feel weak. °· You have very bad cramps in your back or belly (abdomen). °· You pass large clumps of blood (clots) in your poop. °· Your symptoms are getting worse. °This information is not intended to replace advice given to you by your health care provider. Make sure you discuss any questions you have with your health care provider. °Document Released: 03/16/2008 Document Revised: 11/13/2015 Document Reviewed: 11/25/2014 °Elsevier Interactive Patient Education © 2018 [employer] ° °

## 2017-10-03 NOTE — Care Management Note (Signed)
Case Management Note  Patient Details  Name: Anthony Murray MRN: [id number] Date of Birth: 1927/07/08  Subjective/Objective:                    Action/Plan:  Patient has walker and wheel chair at home  Expected Discharge Date:  10/03/17               Expected Discharge Plan:  [place]  In-House Referral:  NA  Discharge planning Services  CM Consult  Post Acute Care Choice:  Home Health Choice offered to:  Patient, Adult Children  DME Arranged:  N/A DME Agency:  NA  HH Arranged:  PT [place] Agency:  [place]  Status of Service:  Completed, signed off  If discussed at [place] of Stay Meetings, dates discussed:    Additional Comments:  Marilu Favre, RN 10/03/2017, 11:18 AM

## 2017-10-03 NOTE — Progress Notes (Signed)
Pt.has refused taking insuline for the night shift regardless the explanation given.

## 2017-10-03 NOTE — Discharge Summary (Addendum)
Discharge Summary  Anthony Murray QIO:[id number] DOB: 07/03/1927  PCP: [name], MD  Admit date: 09/29/2017 Discharge date: 10/03/2017  Time spent: 25 minutes  Recommendations for Outpatient Follow-up:  1. Follow up with PCP  2. Continue PT 3. Take your medications as prescribed 4. Repeat colonoscopy in 6 months due to poor prep and pancolonic diverticulosis with prior history of adenomatous colon polyps in 2011.  Discharge Diagnoses:  Active Hospital Problems   Diagnosis Date Noted  . GI bleed 09/30/2017  . Colostomy in place [facility]) 09/30/2017  . CKD (chronic kidney disease), stage III ([place]) 10/10/2011  . Peripheral vascular disease ([place]) 10/10/2011  . Type 2 diabetes mellitus with complications ([place])   . CEREBROVASCULAR DISEASE 08/28/2009  . Essential hypertension 12/09/2008  . HLD (hyperlipidemia) 12/09/2008    Resolved Hospital Problems  No resolved problems to display.    Discharge Condition: Stable  Diet recommendation: Resume previous diet  Vitals:   10/03/17 0612 10/03/17 0807  BP: 136/70 128/70  Pulse: 79 75  Resp: 19   Temp: 98.8 F (37.1 C)   SpO2: 97%     History of present illness:  this is a 84 y/o male who presents with c/oblood in stool, BRBPR.thetv are unsure if the event repeated. Last colonoscopy prob 10-20 years ago. He has a h/o of colectomy secondary to doiverticulosis. He denies any significant abdominal pain, diarrhea or constipation. He does not use NSAID's, eat spicy foods. He does report GERD. No nausea, no vomiting. His wife called their daughter who brought him to the ER. History n provided mainly by the patient,and daughter present at bedside.  10/02/17: Patient seen and examined at his bedside.  He denies having any bright red blood per rectum or melena today had no bowel movements today.  Patient is POD #1 post colonoscopy.  His hemoglobin is stable at 8.4.  GI is following advance diet to soft today.  If hemoglobin still stable  tomorrow will discharge.  10/03/17: Patient seen and examined at his bedside.  He is tolerating a diet well.  He reports history of chronic constipation.  Was started on MiraLAX daily.   On the day of discharge patient was hemodynamically stable.  He will need to follow-up with GI posthospitalization.  Hospital Course:  Principal Problem:   GI bleed Active Problems:   HLD (hyperlipidemia)   Essential hypertension   CEREBROVASCULAR DISEASE   Type 2 diabetes mellitus with complications (HCC)   CKD (chronic kidney disease), stage III (HCC)   Peripheral vascular disease (HCC)   Colostomy in place [facility])  Acute lower GI bleed secondary to diverticular hemorrhage Resolved Post colonoscopy 10/01/2017 revealing pancolonic diverticulosis Follow-up with GI in the outpatient setting, Dr. Lyndel Safe. Tolerating soft diet Hemoglobin stable at 8.7 No sign of overt bleeding Repeat colonoscopy in 6 months due to poor bowel prep with pancolonic diverticulosis and history of: Adenomatous polyps in 2011.  Acute blood loss anemia Secondary to diverticular bleeding Management as stated above  CKD 3 At baseline with creatinine 1.5 Avoid nephrotoxic agent Follow with PCP  BPH Continue Flomax  COPD Stable Continue home regimen Duonebs q4h prn for wheezing DME for nebulizer machine    Procedures:  Colonoscopy  Consultations:  GI  Discharge Exam: BP 128/70   Pulse 75   Temp 98.8 F (37.1 C) (Oral)   Resp 19   Ht 5\' 7"  (1.702 m)   Wt 88.9 kg (196 lb)   SpO2 97%   BMI 30.70 kg/m  General: 84 year old Caucasian male well-developed well-nourished in no acute distress. Cardiovascular: Regular rate and rhythm with no rubs or gallops.  No JVD or thyromegaly present. Respiratory: Clear to auscultation with no wheezes or rales.  Good air entry.  Discharge Instructions You were cared for by a hospitalist during your hospital stay. If you have any questions about your discharge  medications or the care you received while you were in the hospital after you are discharged, you can call the unit and asked to speak with the hospitalist on call if the hospitalist that took care of you is not available. Once you are discharged, your primary care physician will handle any further medical issues. Please note that NO REFILLS for any discharge medications will be authorized once you are discharged, as it is imperative that you return to your primary care physician (or establish a relationship with a primary care physician if you do not have one) for your aftercare needs so that they can reassess your need for medications and monitor your lab values.  Discharge Instructions    DME Nebulizer machine   Complete by:  As directed    Patient needs a nebulizer to treat with the following condition:  COPD (chronic obstructive pulmonary disease) ([place])     Allergies as of 10/03/2017      Reactions   Codeine Other (See Comments)   Reaction not recalled   Ezetimibe-simvastatin Other (See Comments)   Muscle pain   Naproxen Other (See Comments)   Reaction not recalled   Niacin Other (See Comments)   Reaction not recalled   Nsaids Other (See Comments)   Reaction not recalled   Pravastatin Sodium Other (See Comments)   Muscle pain      Medication List    STOP taking these medications   aspirin-sod bicarb-citric acid 325 MG Tbef tablet Commonly known as:  ALKA-SELTZER   diphenhydramine-acetaminophen 25-500 MG Tabs tablet Commonly known as:  TYLENOL PM   omeprazole 20 MG capsule Commonly known as:  PRILOSEC     TAKE these medications   acetaminophen 325 MG tablet Commonly known as:  TYLENOL Take 325-650 mg by mouth every 6 (six) hours as needed (for pain).   albuterol 108 (90 Base) MCG/ACT inhaler Commonly known as:  PROVENTIL HFA;VENTOLIN HFA Inhale 2 puffs into the lungs every 4 (four) hours as needed for wheezing or shortness of breath.   aspirin 81 MG tablet Take 1  tablet (81 mg total) by mouth daily. What changed:  when to take this   betaxolol 0.5 % ophthalmic suspension Commonly known as:  BETOPTIC-S Place 1 drop into both eyes every morning.   bimatoprost 0.01 % Soln Commonly known as:  LUMIGAN Place 1 drop into both eyes every morning.   CALCIUM 1200+D3 PO Take 1 tablet by mouth daily.   carvedilol 12.5 MG tablet Commonly known as:  COREG Take 12.5 mg by mouth 2 (two) times daily with a meal.   Cranberry 500 MG Caps Take 500 mg by mouth every evening.   dorzolamide 2 % ophthalmic solution Commonly known as:  TRUSOPT Place 1 drop into both eyes 2 (two) times daily.   fexofenadine 180 MG tablet Commonly known as:  ALLEGRA Take 180 mg by mouth daily.   Fish Oil 1000 MG Caps Take 1,000 mg by mouth 2 (two) times daily.   furosemide 40 MG tablet Commonly known as:  LASIX Take 1 tablet (40 mg total) by mouth daily. What changed:    how much  to take  when to take this  additional instructions   glipiZIDE 5 MG 24 hr tablet Commonly known as:  GLUCOTROL XL Take 1 tablet (5 mg total) by mouth daily with breakfast. What changed:  when to take this   ipratropium-albuterol 0.5-2.5 (3) MG/3ML Soln Commonly known as:  DUONEB Take 3 mLs by nebulization every 4 (four) hours as needed.   oxybutynin 5 MG tablet Commonly known as:  DITROPAN Take 5 mg by mouth 2 (two) times daily.   pantoprazole 40 MG tablet Commonly known as:  PROTONIX Take 1 tablet (40 mg total) by mouth 2 (two) times daily.   pilocarpine 2 % ophthalmic solution Commonly known as:  PILOCAR Place 1 drop into the left eye 4 (four) times daily.   polyethylene glycol packet Commonly known as:  MIRALAX / GLYCOLAX Take 17 g by mouth daily. Start taking on:  10/04/2017   potassium chloride 10 MEQ tablet Commonly known as:  K-DUR Take 10 mEq by mouth daily. ONLY WHILE TAKING LASIX   RHOPRESSA 0.02 % Soln Generic drug:  Netarsudil Dimesylate Place 1 drop into  the left eye at bedtime.   sitaGLIPtin 100 MG tablet Commonly known as:  JANUVIA Take 50 mg by mouth daily.   tamsulosin 0.4 MG Caps capsule Commonly known as:  FLOMAX Take 0.4 mg by mouth daily.   TRELEGY ELLIPTA 100-62.5-25 MCG/INH Aepb Generic drug:  Fluticasone-Umeclidin-Vilant Inhale 1 puff into the lungs daily.            Durable Medical Equipment  (From admission, onward)        Start     Ordered   10/03/17 0000  DME Nebulizer machine    Question:  Patient needs a nebulizer to treat with the following condition  Answer:  COPD (chronic obstructive pulmonary disease) ([place])   10/03/17 1235     Allergies  Allergen Reactions  . Codeine Other (See Comments)    Reaction not recalled  . Ezetimibe-Simvastatin Other (See Comments)    Muscle pain  . Naproxen Other (See Comments)    Reaction not recalled  . Niacin Other (See Comments)    Reaction not recalled  . Nsaids Other (See Comments)    Reaction not recalled  . Pravastatin Sodium Other (See Comments)    Muscle pain   Follow-up Information    [name], MD Follow up in 3 day(s).   Specialty:  Internal Medicine Why:  please call office Contact information: [address] [facility] [address] [phone]        Jackquline Denmark, MD Follow up in 1 week(s).   Specialties:  Gastroenterology, Internal Medicine Why:  please call the office Contact information: [place] [address] [place] Follow up.   Why:  Provide home health  Contact information: [address] [place] [address] [phone]            The results of significant diagnostics from this hospitalization (including imaging, microbiology, ancillary and laboratory) are listed below for reference.    Significant Diagnostic Studies: No results found.  Microbiology: No results found for this or any previous visit (from the past 240 hour(s)).    Labs: Basic Metabolic Panel: Recent Labs  Lab 09/29/17 2221 09/30/17 0105 09/30/17 0459 10/02/17 0741 10/03/17 0449  NA 139 138 139 142 139  K 3.7 3.7 3.9 3.5 3.8  CL 105  103 106 111 108  CO2  --  23 23 24 23   GLUCOSE 204* 195* 173* 151* 158*  BUN 28* 27* 30* 24* 18  CREATININE 1.80* 1.80* 1.71* 1.69* 1.57*  CALCIUM  --  8.6* 8.2* 8.2* 8.3*  PHOS  --   --   --  2.9  --    Liver Function Tests: Recent Labs  Lab 09/30/17 0105 10/02/17 0741  AST 22  --   ALT 27  --   ALKPHOS 63  --   BILITOT 0.7  --   PROT 6.4*  --   ALBUMIN 3.5 3.0*   No results for input(s): LIPASE, AMYLASE in the last 168 hours. No results for input(s): AMMONIA in the last 168 hours. CBC: Recent Labs  Lab 09/30/17 0105 09/30/17 0459 09/30/17 1746  10/01/17 1513 10/01/17 2325 10/02/17 0741 10/02/17 1544 10/03/17 0449  WBC 8.1 6.3 8.1  --   --   --   --   --  8.7  NEUTROABS 6.2  --   --   --   --   --   --   --   --   HGB 13.8 11.9* 10.3*   < > 8.8* 7.7* 8.4* 8.1* 8.7*  HCT 42.4 36.5* 31.8*   < > 27.6* 23.6* 26.0* 24.8* 27.2*  MCV 92.4 91.9 91.6  --   --   --   --   --  94.1  PLT 170 155 152  --   --   --   --   --  146*   < > = values in this interval not displayed.   Cardiac Enzymes: No results for input(s): CKTOTAL, CKMB, CKMBINDEX, TROPONINI in the last 168 hours. BNP: BNP (last 3 results) No results for input(s): BNP in the last 8760 hours.  ProBNP (last 3 results) No results for input(s): PROBNP in the last 8760 hours.  CBG: Recent Labs  Lab 10/02/17 2339 10/03/17 0246 10/03/17 0356 10/03/17 0800 10/03/17 1222  GLUCAP 153* 162* 156* 137* 139*       Signed:  Kayleen Memos, MD [facility] 10/03/2017, 12:36 PM

## 2017-10-03 NOTE — Progress Notes (Signed)
     [facility] Progress Note   Chief Complaint:   Rectal bleeding   SUBJECTIVE:    feels okay, no further bleeding / no BMs. Just took miralax,  Daughter in room   ASSESSMENT AND PLAN:   9. 84 yo male with painless hematochezia, presumed diverticular hemorrhage. Blood clots and bowel contents in colon precluded good visualization of bowel during colonoscopy. Bleeding has stopped.  -he should have a repeat colonoscopy in approx 6 months. I will ask our office to put him in for an appt in 5 months to discuss and make sure still medically stable for colonoscopy given advanced age.   2. ABL. Hgb dropped from 13.8 to mid 8 range where it has settled. No blood transfusion required.    OBJECTIVE:      Vital signs in last 24 hours: Temp:  [98.1 F (36.7 C)-98.8 F (37.1 C)] 98.8 F (37.1 C) (04/15 0612) Pulse Rate:  [71-86] 75 (04/15 0807) Resp:  [19] 19 (04/15 0612) BP: (120-147)/(70-77) 128/70 (04/15 0807) SpO2:  [97 %-100 %] 97 % (04/15 0612) Last BM Date: 10/01/17 General:   Alert, well-developed, white male in NAD EENT:  Normal hearing, non icteric sclera, conjunctive pink.  Heart:  Regular rate and rhythm;,no lower extremity edema Pulm: Normal respiratory effort Abdomen:  Soft, nondistended, nontender.  Normal bowel sounds, no masses felt.     Neurologic:  Alert and  oriented x4;  grossly normal neurologically. Psych:  Pleasant, cooperative.  Normal mood and affect.   Intake/Output from previous day: No intake/output data recorded. Intake/Output this shift: No intake/output data recorded.  Lab Results: Recent Labs    09/30/17 1746  10/02/17 0741 10/02/17 1544 10/03/17 0449  WBC 8.1  --   --   --  8.7  HGB 10.3*   < > 8.4* 8.1* 8.7*  HCT 31.8*   < > 26.0* 24.8* 27.2*  PLT 152  --   --   --  146*   < > = values in this interval not displayed.   BMET Recent Labs    10/02/17 0741 10/03/17 0449  NA 142 139  K 3.5 3.8  CL 111 108  CO2 24  23  GLUCOSE 151* 158*  BUN 24* 18  CREATININE 1.69* 1.57*  CALCIUM 8.2* 8.3*   LFT Recent Labs    10/02/17 0741  ALBUMIN 3.0*    Discharge Planning Diet: high fiber  Discharge Medications: No changes from GI standpoint Follow up: will send him letter for a 5 month appt to discuss the 6 month colonoscopy recall  Principal Problem:   GI bleed Active Problems:   HLD (hyperlipidemia)   Essential hypertension   CEREBROVASCULAR DISEASE   Type 2 diabetes mellitus with complications (HCC)   CKD (chronic kidney disease), stage III ([place])   Peripheral vascular disease ([place])   Colostomy in place ([place])     LOS: 3 days   Tye Savoy ,NP 10/03/2017, 11:34 AM  Pager number [phone]

## 2017-10-03 NOTE — Progress Notes (Signed)
Physical Therapy Treatment Patient Details Name: Anthony Murray MRN: [id number] DOB: Jun 20, 1928 Today's Date: 10/03/2017    History of Present Illness Pt is an 84 y/o male admitted secondary to BRBPR with GI bleed. PMH including but not limited to CKD, HTN, DM and ASCVD.    PT Comments    Pt progressing slowly towards physical therapy goals. While ambulating to bed from bathroom, pt unsafely initiating stand>sit on edge of bed. Max assist provided to prevent a fall. Daughter present and states the patient almost fell when they were ambulating into the bathroom earlier today. Focus of session was pt/family education regarding safe mobility at home. Pt is adamant about returning home today and is not interested in discussing continued rehab outside of home health therapies. Feel it will be in his best interest to remain at a transfers-only level and use wheelchair until home health therapies can further assess safety in the home and progress gait training. Nursing student updated regarding high risk of falls and PT requesting a staff member be present to assist pt into the car to return home this afternoon. Will continue to follow.   Follow Up Recommendations  Home health PT;Supervision/Assistance - 24 hour     Equipment Recommendations  None recommended by PT    Recommendations for Other Services       Precautions / Restrictions Restrictions Weight Bearing Restrictions: No    Mobility  Bed Mobility               General bed mobility comments: Pt received in bathroom with daughter present  Transfers Overall transfer level: Needs assistance Equipment used: Rolling walker (2 wheeled) Transfers: Sit to/from Stand Sit to Stand: Max assist         General transfer comment: Pt required max assist to prevent fall during stand>sit. Pt demonstrated uncontrolled descent to edge of bed as he was turning around during ambulation.   Ambulation/Gait Ambulation/Gait assistance: Mod  assist Ambulation Distance (Feet): 7 Feet Assistive device: Rolling walker (2 wheeled) Gait Pattern/deviations: Shuffle;Trunk flexed;Wide base of support Gait velocity: Decreased Gait velocity interpretation: <1.8 ft/sec, indicate of risk for recurrent falls General Gait Details: Pt pushing walked very far ahead of him when attempting to walk. Wearing cowboy boots which appeared to be tripping him up somewhat. When cued for upright posture, pt made no attempts to correct and states "I don't know what difference that makes." Pt audibly short of breath and wheezing after ambulating 7 feet from bathroom to edge of bed.    Stairs             Wheelchair Mobility    Modified Rankin (Stroke Patients Only)       Balance Overall balance assessment: Needs assistance Sitting-balance support: Feet supported;Bilateral upper extremity supported Sitting balance-Leahy Scale: Fair     Standing balance support: Bilateral upper extremity supported;During functional activity Standing balance-Leahy Scale: Poor Standing balance comment: assist required to maintain standing balance.                             Cognition Arousal/Alertness: Awake/alert Behavior During Therapy: WFL for tasks assessed/performed Overall Cognitive Status: Impaired/Different from baseline Area of Impairment: Safety/judgement                         Safety/Judgement: Decreased awareness of safety;Decreased awareness of deficits     General Comments: Pt very unsafe throughout session and pt adamant that  he will be suddenly better when he gets home. Not realistic about deficits.       Exercises      General Comments        Pertinent Vitals/Pain Pain Assessment: No/denies pain    Home Living                      Prior Function            PT Goals (current goals can now be found in the care plan section) Acute Rehab PT Goals Patient Stated Goal: return home PT Goal  Formulation: With patient/family Time For Goal Achievement: 10/14/17 Potential to Achieve Goals: Good Progress towards PT goals: Progressing toward goals    Frequency    Min 3X/week      PT Plan Current plan remains appropriate    Co-evaluation              AM-PAC PT "6 Clicks" Daily Activity  Outcome Measure  Difficulty turning over in bed (including adjusting bedclothes, sheets and blankets)?: Unable Difficulty moving from lying on back to sitting on the side of the bed? : Unable Difficulty sitting down on and standing up from a chair with arms (e.g., wheelchair, bedside commode, etc,.)?: Unable Help needed moving to and from a bed to chair (including a wheelchair)?: A Little Help needed walking in hospital room?: A Little Help needed climbing 3-5 steps with a railing? : Total 6 Click Score: 10    End of Session Equipment Utilized During Treatment: Gait belt Activity Tolerance: Patient limited by fatigue Patient left: in bed;with call bell/phone within reach;with family/visitor present Nurse Communication: Mobility status PT Visit Diagnosis: Other abnormalities of gait and mobility (R26.89)     Time: 2500-3704 PT Time Calculation (min) (ACUTE ONLY): 9 min  Charges:  $Gait Training: 8-22 mins                    G Codes:       Rolinda Roan, PT, DPT Acute Rehabilitation Services Pager: [place] 10/03/2017, 2:47 PM

## 2017-10-05 DIAGNOSIS — S72099A Other fracture of head and neck of unspecified femur, initial encounter for closed fracture: Secondary | ICD-10-CM | POA: Diagnosis not present

## 2017-10-05 DIAGNOSIS — Z933 Colostomy status: Secondary | ICD-10-CM | POA: Diagnosis not present

## 2017-10-05 DIAGNOSIS — I129 Hypertensive chronic kidney disease with stage 1 through stage 4 chronic kidney disease, or unspecified chronic kidney disease: Secondary | ICD-10-CM | POA: Diagnosis not present

## 2017-10-05 DIAGNOSIS — R600 Localized edema: Secondary | ICD-10-CM | POA: Diagnosis not present

## 2017-10-05 DIAGNOSIS — N183 Chronic kidney disease, stage 3 (moderate): Secondary | ICD-10-CM | POA: Diagnosis not present

## 2017-10-05 DIAGNOSIS — R3911 Hesitancy of micturition: Secondary | ICD-10-CM | POA: Diagnosis not present

## 2017-10-05 DIAGNOSIS — S72012D Unspecified intracapsular fracture of left femur, subsequent encounter for closed fracture with routine healing: Secondary | ICD-10-CM | POA: Diagnosis not present

## 2017-10-05 DIAGNOSIS — N400 Enlarged prostate without lower urinary tract symptoms: Secondary | ICD-10-CM | POA: Diagnosis not present

## 2017-10-05 DIAGNOSIS — E1151 Type 2 diabetes mellitus with diabetic peripheral angiopathy without gangrene: Secondary | ICD-10-CM | POA: Diagnosis not present

## 2017-10-05 DIAGNOSIS — Z79891 Long term (current) use of opiate analgesic: Secondary | ICD-10-CM | POA: Diagnosis not present

## 2017-10-05 DIAGNOSIS — D500 Iron deficiency anemia secondary to blood loss (chronic): Secondary | ICD-10-CM | POA: Diagnosis not present

## 2017-10-05 DIAGNOSIS — J449 Chronic obstructive pulmonary disease, unspecified: Secondary | ICD-10-CM | POA: Diagnosis not present

## 2017-10-05 DIAGNOSIS — I350 Nonrheumatic aortic (valve) stenosis: Secondary | ICD-10-CM | POA: Diagnosis not present

## 2017-10-05 DIAGNOSIS — Z6830 Body mass index (BMI) 30.0-30.9, adult: Secondary | ICD-10-CM | POA: Diagnosis not present

## 2017-10-05 DIAGNOSIS — E785 Hyperlipidemia, unspecified: Secondary | ICD-10-CM | POA: Diagnosis not present

## 2017-10-05 DIAGNOSIS — K219 Gastro-esophageal reflux disease without esophagitis: Secondary | ICD-10-CM | POA: Diagnosis not present

## 2017-10-05 DIAGNOSIS — K922 Gastrointestinal hemorrhage, unspecified: Secondary | ICD-10-CM | POA: Diagnosis not present

## 2017-10-05 DIAGNOSIS — Z7984 Long term (current) use of oral hypoglycemic drugs: Secondary | ICD-10-CM | POA: Diagnosis not present

## 2017-10-05 DIAGNOSIS — E1122 Type 2 diabetes mellitus with diabetic chronic kidney disease: Secondary | ICD-10-CM | POA: Diagnosis not present

## 2017-10-05 DIAGNOSIS — Z8673 Personal history of transient ischemic attack (TIA), and cerebral infarction without residual deficits: Secondary | ICD-10-CM | POA: Diagnosis not present

## 2017-10-05 DIAGNOSIS — R944 Abnormal results of kidney function studies: Secondary | ICD-10-CM | POA: Diagnosis not present

## 2017-10-05 DIAGNOSIS — R339 Retention of urine, unspecified: Secondary | ICD-10-CM | POA: Diagnosis not present

## 2017-10-05 DIAGNOSIS — Z7982 Long term (current) use of aspirin: Secondary | ICD-10-CM | POA: Diagnosis not present

## 2017-10-05 DIAGNOSIS — Z7951 Long term (current) use of inhaled steroids: Secondary | ICD-10-CM | POA: Diagnosis not present

## 2017-10-06 DIAGNOSIS — S72012D Unspecified intracapsular fracture of left femur, subsequent encounter for closed fracture with routine healing: Secondary | ICD-10-CM | POA: Diagnosis not present

## 2017-10-06 DIAGNOSIS — S72099A Other fracture of head and neck of unspecified femur, initial encounter for closed fracture: Secondary | ICD-10-CM | POA: Diagnosis not present

## 2017-10-06 DIAGNOSIS — Z6830 Body mass index (BMI) 30.0-30.9, adult: Secondary | ICD-10-CM | POA: Diagnosis not present

## 2017-10-06 DIAGNOSIS — D509 Iron deficiency anemia, unspecified: Secondary | ICD-10-CM | POA: Diagnosis not present

## 2017-10-06 DIAGNOSIS — I129 Hypertensive chronic kidney disease with stage 1 through stage 4 chronic kidney disease, or unspecified chronic kidney disease: Secondary | ICD-10-CM | POA: Diagnosis not present

## 2017-10-06 DIAGNOSIS — R944 Abnormal results of kidney function studies: Secondary | ICD-10-CM | POA: Diagnosis not present

## 2017-10-06 DIAGNOSIS — E1151 Type 2 diabetes mellitus with diabetic peripheral angiopathy without gangrene: Secondary | ICD-10-CM | POA: Diagnosis not present

## 2017-10-06 DIAGNOSIS — Z6831 Body mass index (BMI) 31.0-31.9, adult: Secondary | ICD-10-CM | POA: Diagnosis not present

## 2017-10-06 DIAGNOSIS — E1122 Type 2 diabetes mellitus with diabetic chronic kidney disease: Secondary | ICD-10-CM | POA: Diagnosis not present

## 2017-10-06 DIAGNOSIS — I350 Nonrheumatic aortic (valve) stenosis: Secondary | ICD-10-CM | POA: Diagnosis not present

## 2017-10-06 DIAGNOSIS — Z7984 Long term (current) use of oral hypoglycemic drugs: Secondary | ICD-10-CM | POA: Diagnosis not present

## 2017-10-06 DIAGNOSIS — R339 Retention of urine, unspecified: Secondary | ICD-10-CM | POA: Diagnosis not present

## 2017-10-17 ENCOUNTER — Ambulatory Visit (INDEPENDENT_AMBULATORY_CARE_PROVIDER_SITE_OTHER): Payer: PPO | Admitting: Cardiology

## 2017-10-17 ENCOUNTER — Encounter: Payer: Self-pay | Admitting: Cardiology

## 2017-10-17 VITALS — BP 131/70 | HR 73 | Ht 66.5 in | Wt 190.6 lb

## 2017-10-17 DIAGNOSIS — I6523 Occlusion and stenosis of bilateral carotid arteries: Secondary | ICD-10-CM | POA: Diagnosis not present

## 2017-10-17 DIAGNOSIS — I350 Nonrheumatic aortic (valve) stenosis: Secondary | ICD-10-CM | POA: Diagnosis not present

## 2017-10-17 DIAGNOSIS — I2510 Atherosclerotic heart disease of native coronary artery without angina pectoris: Secondary | ICD-10-CM

## 2017-10-17 DIAGNOSIS — E782 Mixed hyperlipidemia: Secondary | ICD-10-CM

## 2017-10-17 DIAGNOSIS — I38 Endocarditis, valve unspecified: Secondary | ICD-10-CM

## 2017-10-17 DIAGNOSIS — I10 Essential (primary) hypertension: Secondary | ICD-10-CM | POA: Diagnosis not present

## 2017-10-17 NOTE — Progress Notes (Signed)
Clinical Summary [name] is a 84 y.o.male seen today for follow up of the following medical problems.   1. Aortic stenosis - 08/2014 echo LVEF 55-60%, mod to severe AS (mean grad 11, AVA0.99) 08/2015 echo LVEF 59-56%, grade I diastolic dysfunction, mean grad 10, dimensionless index 0.39, AVA VTI 0.88 - 07/2017 echo mean grad 11, dimensionless index 35, AVA VTI 0.88 - 07/2017 TEE AVA planimetry 1.44 - breathing improved with previouschange in inhalers.    - no recent SOB. Can have some LE edema at times.    2. COPD - followed by pcp   3. CAD - cath 11/2004 with moderate non-obstructive disease - echo 09/2012 LVEF 55-60%  - no recent chest pain  4. HL - reports statins have caused muscle aches including pravastatin   - reports last labs with pcp - continuing dietary modificatoin  5. Carotid stenosis 07/2017 Carotid US mild RICA, LICA 38-75% - no recent neuro symptoms   6. HTN - compliant with meds  7. CKDIII - followed by nephrology.  8. Endocarditis - small vegetations noted on MV and AV during 07/2017 admission - ID plans for 4 weeks of vanc and rocephin which he completed - needs repeat echo  9. CVA - ASA was increased to bid dosing from hospital notes  - back to 61m daily due to recent GI bleeding.    10. GI bleed - admitted 10/03/17 - no recurrent bleeding.     Past Medical History:  Diagnosis Date  . Aortic valve stenosis   . ASCVD (arteriosclerotic cardiovascular disease)    50% ostial left left anterior desending 60% mid circumflex and normal ejection fractioon in 2006 exertional dyspnea;history of chest discomfort  . Cerebrovascular disease    minimal carotid bruits  . CKD (chronic kidney disease)   . Diabetes mellitus type 2, controlled ([place]    no insulin  . Hyperlipidemia    statin intolerant  . Hypertension   . Tobacco abuse, in remission    30 pack years stopped in 1971     Allergies  Allergen Reactions  .  Codeine Other (See Comments)    Reaction not recalled  . Ezetimibe-Simvastatin Other (See Comments)    Muscle pain  . Naproxen Other (See Comments)    Reaction not recalled  . Niacin Other (See Comments)    Reaction not recalled  . Nsaids Other (See Comments)    Reaction not recalled  . Pravastatin Sodium Other (See Comments)    Muscle pain     Current Outpatient Medications  Medication Sig Dispense Refill  . acetaminophen (TYLENOL) 325 MG tablet Take 325-650 mg by mouth every 6 (six) hours as needed (for pain).     .Marland Kitchenalbuterol (PROVENTIL HFA;VENTOLIN HFA) 108 (90 Base) MCG/ACT inhaler Inhale 2 puffs into the lungs every 4 (four) hours as needed for wheezing or shortness of breath.     .Marland Kitchenaspirin 81 MG tablet Take 1 tablet (81 mg total) by mouth daily. 30 tablet 0  . betaxolol (BETOPTIC-S) 0.5 % ophthalmic suspension Place 1 drop into both eyes every morning.     . bimatoprost (LUMIGAN) 0.01 % SOLN Place 1 drop into both eyes every morning.     . Calcium-Magnesium-Vitamin D (CALCIUM 1200+D3 PO) Take 1 tablet by mouth daily.    . carvedilol (COREG) 12.5 MG tablet Take 12.5 mg by mouth 2 (two) times daily with a meal.     . Cranberry 500 MG CAPS Take 500 mg by  mouth every evening.     . dorzolamide (TRUSOPT) 2 % ophthalmic solution Place 1 drop into both eyes 2 (two) times daily.     . fexofenadine (ALLEGRA) 180 MG tablet Take 180 mg by mouth daily.    . Fluticasone-Umeclidin-Vilant (TRELEGY ELLIPTA) 100-62.5-25 MCG/INH AEPB Inhale 1 puff into the lungs daily.    . furosemide (LASIX) 40 MG tablet Take 1 tablet (40 mg total) by mouth daily. (Patient taking differently: Take 20-40 mg by mouth See admin instructions. Take 20 mg by mouth in the morning rotating with 40 mg every other day) 90 tablet 3  . glipiZIDE (GLUCOTROL XL) 5 MG 24 hr tablet Take 1 tablet (5 mg total) by mouth daily with breakfast. 30 tablet 0  . ipratropium-albuterol (DUONEB) 0.5-2.5 (3) MG/3ML SOLN Take 3 mLs by  nebulization every 4 (four) hours as needed. 360 mL 0  . Netarsudil Dimesylate (RHOPRESSA) 0.02 % SOLN Place 1 drop into the left eye at bedtime.     . Omega-3 Fatty Acids (FISH OIL) 1000 MG CAPS Take 1,000 mg by mouth 2 (two) times daily.     Marland Kitchen oxybutynin (DITROPAN) 5 MG tablet Take 5 mg by mouth 2 (two) times daily.    . pantoprazole (PROTONIX) 40 MG tablet Take 1 tablet (40 mg total) by mouth 2 (two) times daily. 60 tablet 0  . pilocarpine (PILOCAR) 2 % ophthalmic solution Place 1 drop into the left eye 4 (four) times daily.     . polyethylene glycol (MIRALAX / GLYCOLAX) packet Take 17 g by mouth daily. 14 each 0  . potassium chloride (K-DUR) 10 MEQ tablet Take 10 mEq by mouth daily. ONLY WHILE TAKING LASIX    . sitaGLIPtin (JANUVIA) 100 MG tablet Take 50 mg by mouth daily.    . tamsulosin (FLOMAX) 0.4 MG CAPS capsule Take 0.4 mg by mouth daily.     No current facility-administered medications for this visit.      Past Surgical History:  Procedure Laterality Date  . APPENDECTOMY    . CARPAL TUNNEL RELEASE     surgery left 05/05/2001  . COLONOSCOPY N/A 10/01/2017   Procedure: COLONOSCOPY;  Surgeon: Jackquline Denmark, MD;  Location: [facility] ENDOSCOPY;  Service: Endoscopy;  Laterality: N/A;  . KNEE ARTHROSCOPY     bilaterial; unknown associated surgical procedure  . SHOULDER SURGERY     Right x2; third procedure on left  . TEE WITHOUT CARDIOVERSION N/A 07/29/2017   Procedure: TRANSESOPHAGEAL ECHOCARDIOGRAM (TEE) WITH PROPOFOL;  Surgeon: Satira Sark, MD;  Location: [facility] ENDO SUITE;  Service: Cardiovascular;  Laterality: N/A;  . TONSILLECTOMY    . TRANSURETHRAL INCISION OF PROSTATE     resection of the prostate     Allergies  Allergen Reactions  . Codeine Other (See Comments)    Reaction not recalled  . Ezetimibe-Simvastatin Other (See Comments)    Muscle pain  . Naproxen Other (See Comments)    Reaction not recalled  . Niacin Other (See Comments)    Reaction not recalled  . Nsaids  Other (See Comments)    Reaction not recalled  . Pravastatin Sodium Other (See Comments)    Muscle pain      No family history on file.   Social History Anthony Murray reports that he quit smoking about 48 years ago. His smoking use included cigarettes. He has a 40.00 pack-year smoking history. He has never used smokeless tobacco. Anthony Murray reports that he does not drink alcohol.   Review of Systems  CONSTITUTIONAL: No weight loss, fever, chills, weakness or fatigue.  HEENT: Eyes: No visual loss, blurred vision, double vision or yellow sclerae.No hearing loss, sneezing, congestion, runny nose or sore throat.  SKIN: No rash or itching.  CARDIOVASCULAR: per hpi RESPIRATORY: No shortness of breath, cough or sputum.  GASTROINTESTINAL: No anorexia, nausea, vomiting or diarrhea. No abdominal pain or blood.  GENITOURINARY: No burning on urination, no polyuria NEUROLOGICAL: No headache, dizziness, syncope, paralysis, ataxia, numbness or tingling in the extremities. No change in bowel or bladder control.  MUSCULOSKELETAL: No muscle, back pain, joint pain or stiffness.  LYMPHATICS: No enlarged nodes. No history of splenectomy.  PSYCHIATRIC: No history of depression or anxiety.  ENDOCRINOLOGIC: No reports of sweating, cold or heat intolerance. No polyuria or polydipsia.  Marland Kitchen   Physical Examination Vitals:   10/17/17 1117  BP: 131/70  Pulse: 73  SpO2: 98%   Vitals:   10/17/17 1117  Weight: 190 lb 9.6 oz (86.5 kg)  Height: 5' 6.5" (1.689 m)    Gen: resting comfortably, no acute distress HEENT: no scleral icterus, pupils equal round and reactive, no palptable cervical adenopathy,  CV: RRR, 3/6 systolic murmur rusb, no jvd Resp: Clear to auscultation bilaterally GI: abdomen is soft, non-tender, non-distended, normal bowel sounds, no hepatosplenomegaly MSK: extremities are warm, 1-2+ bilateral LE edema Skin: warm, no rash Neuro:  no focal deficits Psych: appropriate  affect   Diagnostic Studies 09/2012 stress echo:  Study Conclusions  - Stress ECG conclusions: There were no significant stress arrhythmias or conduction abnormalities; rare PVCs were present during exercise and in recovery. The stress ECG was negative for ischemia. - Staged echo: There was no echocardiographic evidence for stress-induced ischemia.  09/2012 echo Study Conclusions  - Left ventricle: The cavity size was normal. Wall thickness was increased in a pattern of mild LVH. There was moderate focal basal hypertrophy of the septum. Systolic function was normal. The estimated ejection fraction was in the range of 55% to 60%. Wall motion was normal; there were no regional wall motion abnormalities. - Aortic valve: Mildly to moderately calcified annulus. Moderately thickened, moderately calcified leaflets. Valve mobility was mildly restricted. There was mild stenosis. Trivial regurgitation. Valve area: 0.9cm^2(VTI). Valve area: 0.79cm^2 (Vmax). - Aorta: The aorta was moderately calcified. - Mitral valve: Calcified annulus. Mildly calcified leaflets . - Atrial septum: No defect or patent foramen ovale was identified.  11/2004 Cath FINDINGS: 1. Patient does not have dextrocardia. The heart is normally located in  the left side of the chest. Aortic arch appears normal based on the  course of the catheter through it. 2. LV 167/19/25. 3. No aortic stenosis. 4. Left main: Angiographically normal. 5. LAD: Large vessel giving rise to a single, very large, diagonal. There  is a 50% stenosis of the ostium of the LAD. 6. Circumflex: Large vessel giving rise to three obtuse marginals. There  is a 60% stenosis of the mid vessel. 7. RCA: Tortuous, dominant vessel. It is angiographically normal.  IMPRESSION: 1. No dextrocardia. 2. No previously placed coronary stent. 3. Moderate nonobstructive coronary  disease. 4. Elevated left ventricular end diastolic pressure in the setting of  systemic hypertension.  08/2014 echo Study Conclusions  - Left ventricle: The cavity size was normal. There was mild focal basal hypertrophy of the septum. Systolic function was normal. The estimated ejection fraction was in the range of 55% to 60%. Wall motion was normal; there were no regional wall motion abnormalities. Doppler parameters are consistent with abnormal left ventricular  relaxation (grade 1 diastolic dysfunction). - Aortic valve: Functionally bicuspid; moderately calcified leaflets. Cusp separation was reduced. There was moderate to severe stenosis. There was mild regurgitation. Mean gradient (S): 11 mm Hg. Peak gradient (S): 22 mm Hg. VTI ratio of LVOT to aortic valve: 0.44. Valve area (VTI): 0.99 cm^2. - Mitral valve: Calcified annulus. There was trivial regurgitation. - Left atrium: The atrium was mildly dilated. - Right atrium: Central venous pressure (est): 8 mm Hg. - Tricuspid valve: There was trivial regurgitation. - Pulmonary arteries: Systolic pressure could not be accurately estimated. - Pericardium, extracardiac: There was no pericardial effusion.  Impressions:  - Mild basal septal hypertrophy with LVEF 55-60% and grade 1 diastolic dysfunction. Mild left atrial enlargement. Functionally bicuspid aortic valve with mild aortic regurgitation and moderate to severe aortic stenosis. Measures are somewhat discordant with relatively low gradients and LVOT/AV ratio 0.44, but planimetry and VTI valve area around 1 cm2. Unable to assess PASP.  01/2015 PFTs Mild to moderate ventilatory defect   08/2015 echo  Study Conclusions  - Left ventricle: The cavity size was normal. Wall thickness was  increased in a pattern of mild LVH. Systolic function was normal.  The estimated ejection fraction was in the range of 60% to 65%.  Wall motion  was normal; there were no regional wall motion  abnormalities. Doppler parameters are consistent with abnormal  left ventricular relaxation (grade 1 diastolic dysfunction). - Aortic valve: Mildly to moderately calcified annulus.  Functionally bicuspid. There was mild regurgitation. Mean  gradient (S): 10 mm Hg. Peak gradient (S): 20 mm Hg. VTI ratio of  LVOT to aortic valve: 0.39. Valve area (VTI): 0.88 cm^2. - Mitral valve: Calcified annulus. There was trivial regurgitation. - Left atrium: The atrium was at the upper limits of normal in  size. - Right atrium: Central venous pressure (est): 3 mm Hg. - Tricuspid valve: There was trivial regurgitation. - Pulmonary arteries: Systolic pressure could not be accurately  estimated. - Pericardium, extracardiac: There was no pericardial effusion.  Impressions:  - Mild LVH with LVEF 60-65%. Grade 1 diastolic dysfunction with  normal estimated LV filling pressure. Upper normal left atrial  chamber size. MAC with trivial mitral regurgitation. Functionally  bicuspid, calcified aortic valve with at least moderate aortic  stenosis. As mentioned in the prior study, measurements are  discordant with relatively low gradients but LVOT/AV VTI ratio  0.39. Unable to get good valve planimetry on this particular  study. There is mild aortic regurgitation.  07/2017 echo Study Conclusions  - Left ventricle: The cavity size was normal. Wall thickness was increased in a pattern of mild LVH. Systolic function was normal. The estimated ejection fraction was in the range of 55% to 60%. Wall motion was normal; there were no regional wall motion abnormalities. Doppler parameters are consistent with abnormal left ventricular relaxation (grade 1 diastolic dysfunction). - Aortic valve: Functionally bicuspid; moderately calcified leaflets. There was moderate to severe stenosis. There was mild regurgitation. Mean gradient (S): 11 mm  Hg. Peak gradient (S): 20 mm Hg. VTI ratio of LVOT to aortic valve: 0.35. Valve area (VTI): 0.88 cm^2. - Mitral valve: Mildly calcified annulus. There was mild regurgitation. There is an intermittently seen echodensity seen on the atrial side of the anterior mitral leaflet concerning for vegetation in patient with history of fever and stroke. - Left atrium: The atrium was mildly dilated. - Right atrium: Central venous pressure (est): 8 mm Hg. - Tricuspid valve: There was trivial regurgitation. - Pulmonary arteries: Systolic  pressure could not be accurately estimated. - Pericardium, extracardiac: There was no pericardial effusion.  Impressions:  - There is an intermittently seen echodensity seen on the atrial side of the anterior mitral leaflet concerning for vegetation in patient with history of fever and stroke.   07/2017 TEE Study Conclusions  - Left ventricle: Systolic function was normal. The estimated ejection fraction was in the range of 55% to 60%. Wall motion was normal; there were no regional wall motion abnormalities. - Aortic valve: Small, freely mobile echodensity seen intermittently on the ventricular side of the right coronary aortic cusp suggestive of vegetation given clinical scenario. There was moderate stenosis. Valve area approximately 1.4 cm2 by planimetry. There was mild regurgitation. - Aorta: Moderate to severe diffuse atherosclerosis present. - Mitral valve: Very small, freely mobile echodensity on the atrial side of the posterior mitral leaflet suggestive of vegetation given clinical scenario. The larger abnormality in region of anterior leaflet noted on the transthoracic study is not present - perhaps more reflective of artifact. Mildly calcified annulus. There was mild regurgitation. - Left atrium: The atrium was dilated. No evidence of thrombus in the atrial cavity or appendage. Emptying velocity was  normal. - Right atrium: No evidence of thrombus in the atrial cavity or appendage. - Atrial septum: No significant defect or patent foramen ovale was identified by color Doppler or agitated saline injection. - Tricuspid valve: There was trivial regurgitation. - Pulmonic valve: There was mild regurgitation. - Pericardium, extracardiac: There was no pericardial effusion.  Impressions:  - Very small, freely mobile echodensity on the atrial side of the posterior mitral leaflet suggestive of vegetation given clinical scenario. The larger abnormality in region of anterior leaflet noted on the transthoracic study is not present - perhaps more reflective of artifact. There is also a small, freely mobile echodensity seen intermittently on the ventricular side of the right coronary aortic cusp suggestive of vegetation given clinical scenario. No significant defect or patent foramen ovale was identified by color Doppler or agitated saline injection     Assessment and Plan  1. Aortic stenosis - no symptoms, continue to monitor - f/u repeat echo.   2. CAD - no symptoms, conitnue current meds  3. HL - intolerant to statins, continue dietary modification. LDL has been reasonable, does not require alternative therapies. - we will continue to monitor at this time.   4. HTN - he is at goal, continue current meds  5. Carotid stenosis -mild to moderate, repeat carotid US next year   6. Endocarditis - has completed abx treatment, repeat echo  7. LE edema - continue lasix, repeat BMET/Mg  8 GI bleed - resolved - repeat cbc, conitnue ASA 93m daily, would not put back on the bid dosing he was on preivously.      Anthony Murray M.D.

## 2017-10-17 NOTE — Patient Instructions (Addendum)
Medication Instructions:  Your physician recommends that you continue on your current medications as directed. Please refer to the Current Medication list given to you today.  Labwork:  BMET,CBC  Orders given today  Testing/Procedures: Your physician has requested that you have an echocardiogram. Echocardiography is a painless test that uses sound waves to create images of your heart. It provides your doctor with information about the size and shape of your heart and how well your heart's chambers and valves are working. This procedure takes approximately one hour. There are no restrictions for this procedure.  Follow-Up: Your physician wants you to follow-up in: 4 MONTHS WITH DR. BRANCH You will receive a reminder letter in the mail two months in advance. If you don't receive a letter, please call our office to schedule the follow-up appointment.  Any Other Special Instructions Will Be Listed Below (If Applicable).  If you need a refill on your cardiac medications before your next appointment, please call your pharmacy.

## 2017-10-21 ENCOUNTER — Telehealth: Payer: Self-pay | Admitting: *Deleted

## 2017-10-21 DIAGNOSIS — I10 Essential (primary) hypertension: Secondary | ICD-10-CM

## 2017-10-21 NOTE — Telephone Encounter (Signed)
Pt aware and voice understanding - updated medication list and will mail pt lab orders - routed to pcp

## 2017-10-21 NOTE — Telephone Encounter (Signed)
-----   Message from [name], MD sent at 10/21/2017 10:44 AM EDT ----- Mild  decrease in his renal function, I would change lasix to 40mg  alternating with 20mg . Repeat BMET/Mg in 1 month  Zandra Abts MD

## 2017-10-22 ENCOUNTER — Encounter: Payer: Self-pay | Admitting: Cardiology

## 2017-10-28 DIAGNOSIS — R944 Abnormal results of kidney function studies: Secondary | ICD-10-CM | POA: Diagnosis not present

## 2017-10-28 DIAGNOSIS — I350 Nonrheumatic aortic (valve) stenosis: Secondary | ICD-10-CM | POA: Diagnosis not present

## 2017-10-28 DIAGNOSIS — E1151 Type 2 diabetes mellitus with diabetic peripheral angiopathy without gangrene: Secondary | ICD-10-CM | POA: Diagnosis not present

## 2017-10-28 DIAGNOSIS — E1165 Type 2 diabetes mellitus with hyperglycemia: Secondary | ICD-10-CM | POA: Diagnosis not present

## 2017-10-28 DIAGNOSIS — S72099A Other fracture of head and neck of unspecified femur, initial encounter for closed fracture: Secondary | ICD-10-CM | POA: Diagnosis not present

## 2017-10-28 DIAGNOSIS — S72012D Unspecified intracapsular fracture of left femur, subsequent encounter for closed fracture with routine healing: Secondary | ICD-10-CM | POA: Diagnosis not present

## 2017-10-28 DIAGNOSIS — Z6830 Body mass index (BMI) 30.0-30.9, adult: Secondary | ICD-10-CM | POA: Diagnosis not present

## 2017-10-28 DIAGNOSIS — R339 Retention of urine, unspecified: Secondary | ICD-10-CM | POA: Diagnosis not present

## 2017-10-28 DIAGNOSIS — I6529 Occlusion and stenosis of unspecified carotid artery: Secondary | ICD-10-CM | POA: Diagnosis not present

## 2017-10-28 DIAGNOSIS — I129 Hypertensive chronic kidney disease with stage 1 through stage 4 chronic kidney disease, or unspecified chronic kidney disease: Secondary | ICD-10-CM | POA: Diagnosis not present

## 2017-10-31 DIAGNOSIS — N189 Chronic kidney disease, unspecified: Secondary | ICD-10-CM | POA: Diagnosis not present

## 2017-10-31 DIAGNOSIS — J449 Chronic obstructive pulmonary disease, unspecified: Secondary | ICD-10-CM | POA: Diagnosis not present

## 2017-10-31 DIAGNOSIS — M84459A Pathological fracture, hip, unspecified, initial encounter for fracture: Secondary | ICD-10-CM | POA: Diagnosis not present

## 2017-11-01 IMAGING — CT CT HEAD W/O CM
4 series · 16 of 47 positions shown, 18 images · non-contrast
Comparison: 01/01/2008 CT head

CLINICAL DATA: 89 y/o  M; status post fall with head injury.

EXAM:
CT HEAD WITHOUT CONTRAST
TECHNIQUE: Contiguous axial images were obtained from the base of the skull
through the vertex without intravenous contrast.

[Series 2: head trauma wo · axial · 0.43mm/px · z∈[+154,+274]mm · 7 of 33 slices shown, 9 images]
[im 5/33  brain]
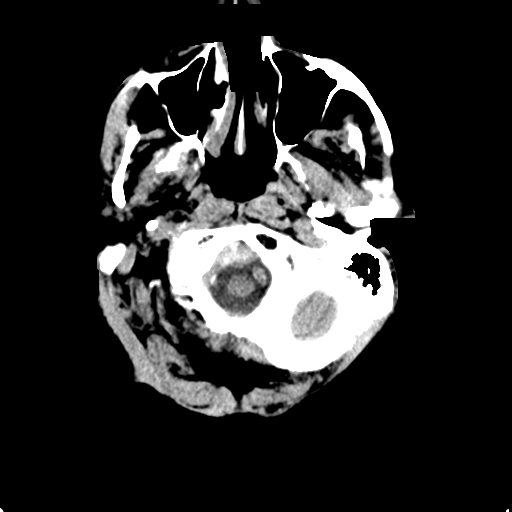
[im 5/33  bone]
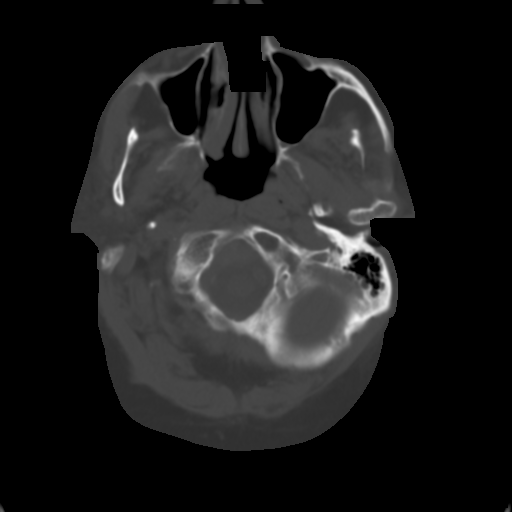
[im 9/33  brain]
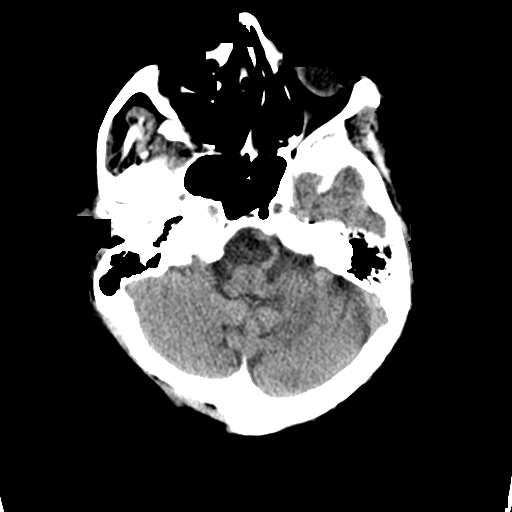
[im 13/33  brain]
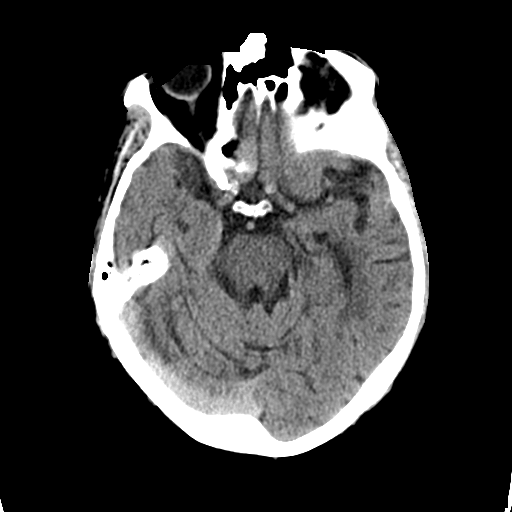
[im 17/33  brain]
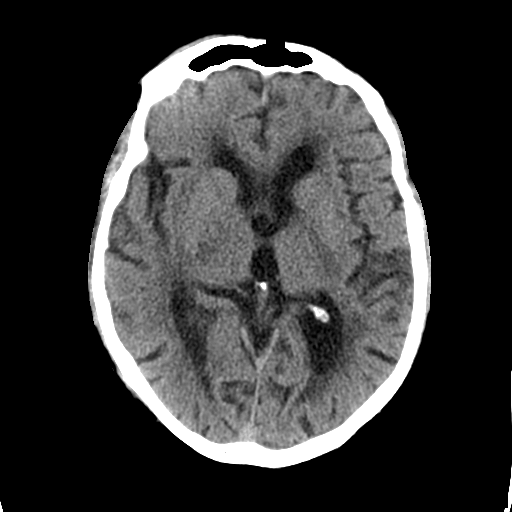
[im 21/33  brain]
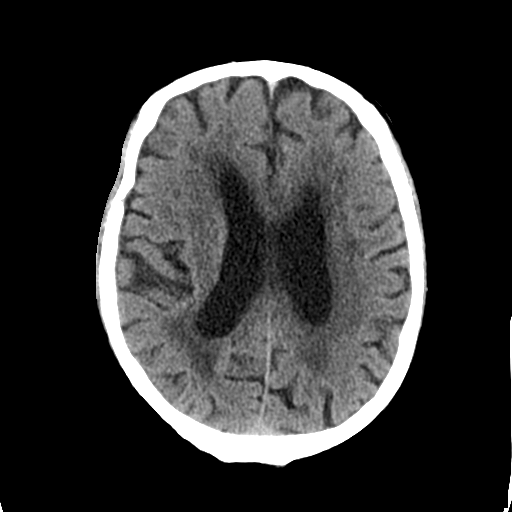
[im 21/33  bone]
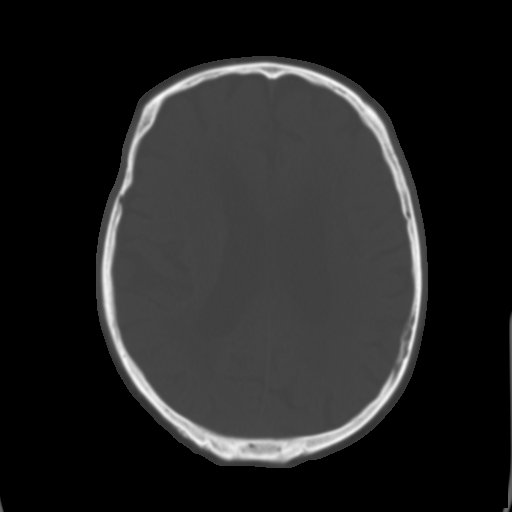
[im 25/33  brain]
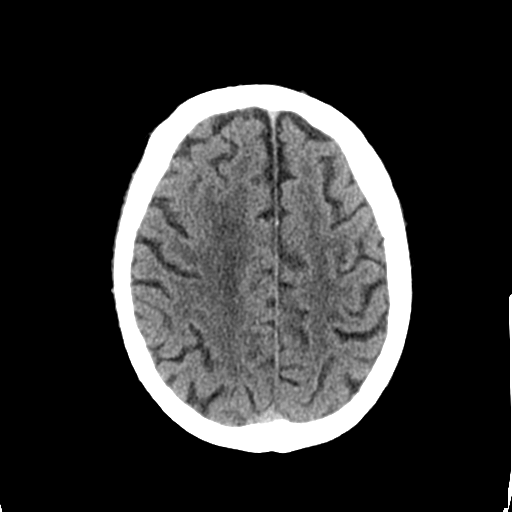
[im 29/33  brain]
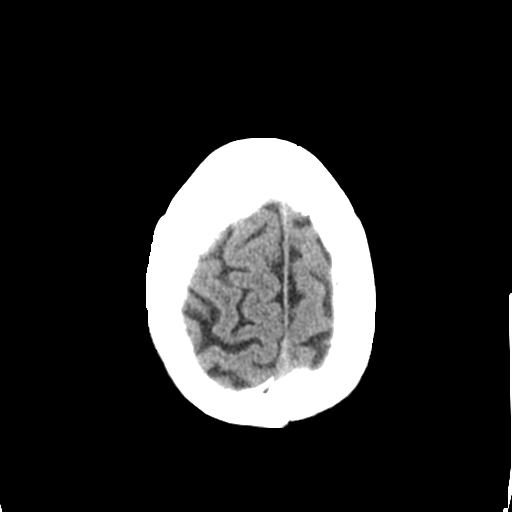

[Series 3: head bone · axial · 0.43mm/px · z∈[+150,+182]mm · 3 of 82 slices shown]
[im 9/82  bone]
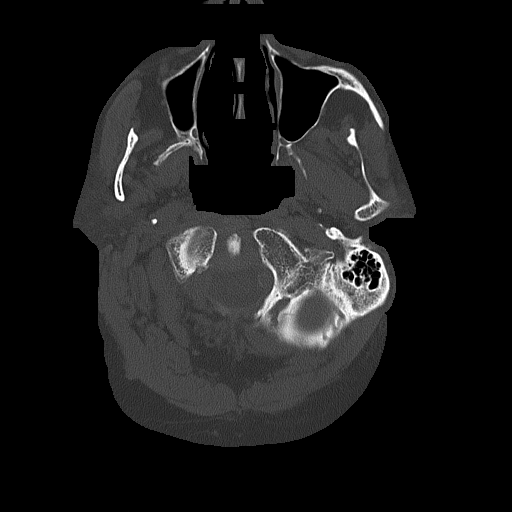
[im 17/82  bone]
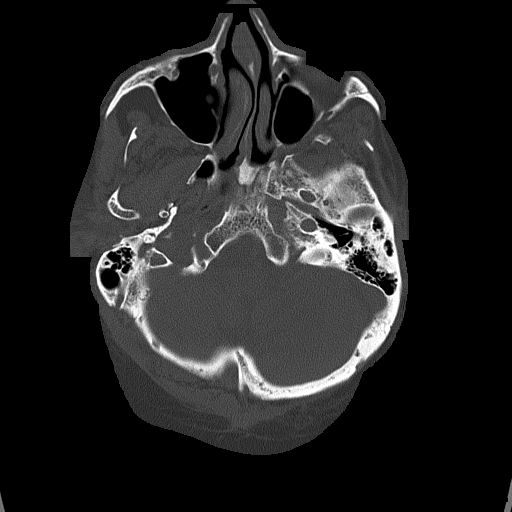
[im 25/82  bone]
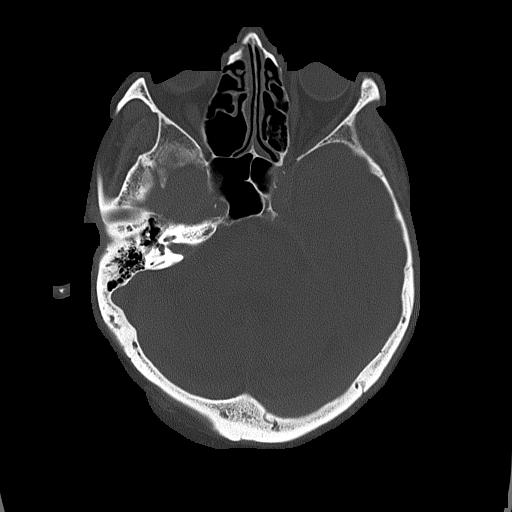

[Series 4: coronal soft tissue · coronal · 0.33mm/px · 3 of 70 slices shown]
[im 24/70  brain]
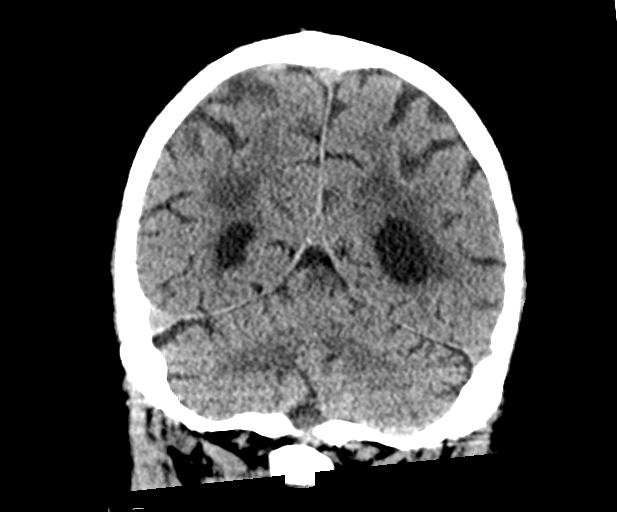
[im 31/70  brain]
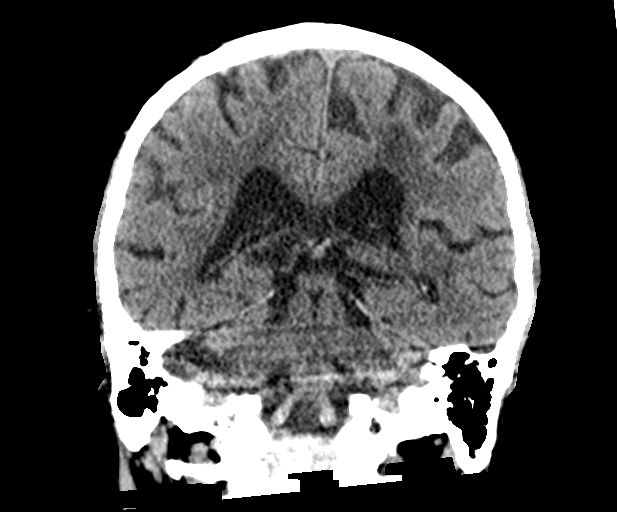
[im 39/70  brain]
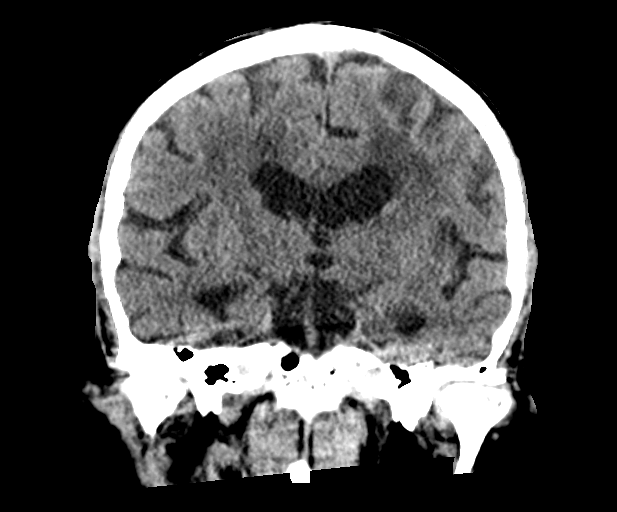

[Series 5: sagittal soft tissue · sagittal · 0.33mm/px · 3 of 53 slices shown]
[im 18/53  brain]
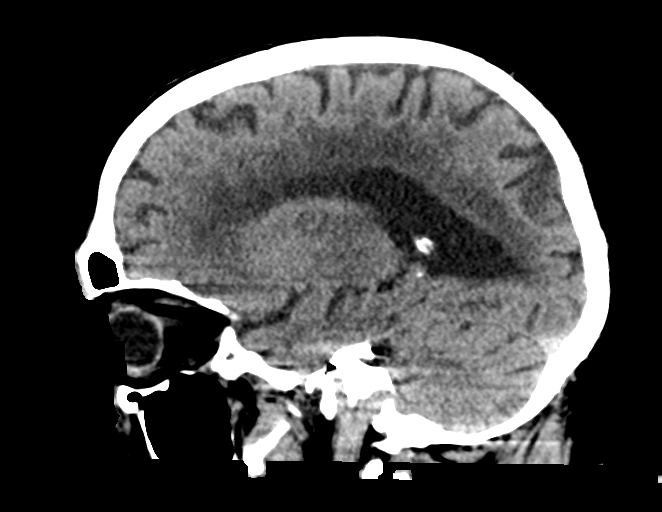
[im 27/53  brain]
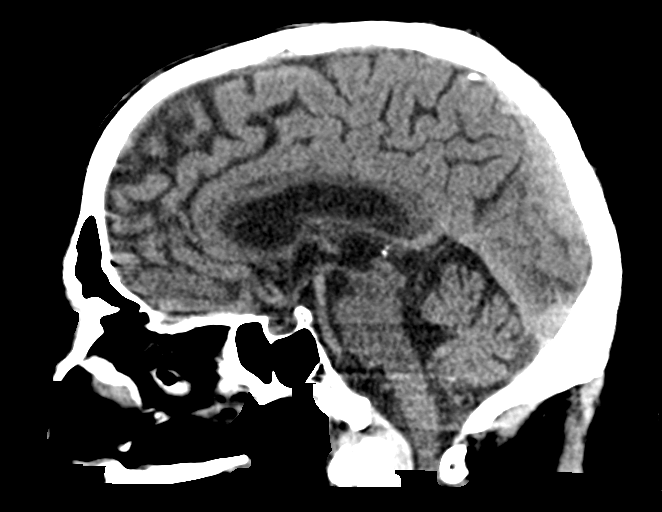
[im 35/53  brain]
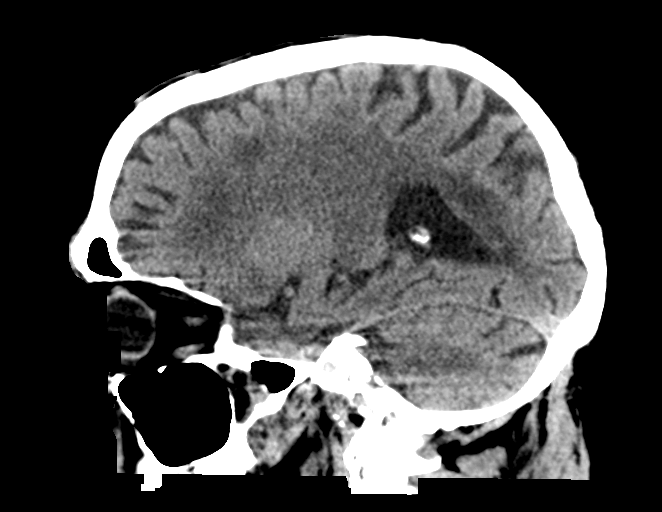

[16 of 47 positions shown; findings below may reference images not displayed]

FINDINGS: Brain: No evidence of acute infarction, hemorrhage, hydrocephalus,
extra-axial collection or mass lesion/mass effect. Scattered foci of
hypoattenuation in subcortical periventricular white matter
compatible moderate chronic microvascular ischemic changes for age
and progressed from 4111. Mild brain parenchymal volume loss.

Vascular: Calcific atherosclerosis of carotid siphons. No hyperdense
vessel identified.

Skull: Normal. Negative for fracture or focal lesion.

Sinuses/Orbits: Aerosolized secretions within left posterior ethmoid
air cells. Otherwise negative. Bilateral intra-ocular lens
replacement.

Other: None.
IMPRESSION: 1. No acute intracranial abnormality or displaced calvarial
fracture.
2. Moderate for age chronic microvascular ischemic changes and mild
parenchymal volume loss of the brain with progression from 4111.

By: Rambriksh Tiger M.D.

## 2017-11-01 IMAGING — DX DG CHEST 1V
1 series · 1 of 1 positions shown · non-contrast
Comparison: Radiograph 01/24/2016

CLINICAL DATA: Fall today injuring left hip and striking head. Left
hip fracture. Initial encounter.

EXAM:
CHEST 1 VIEW

[chest ap]
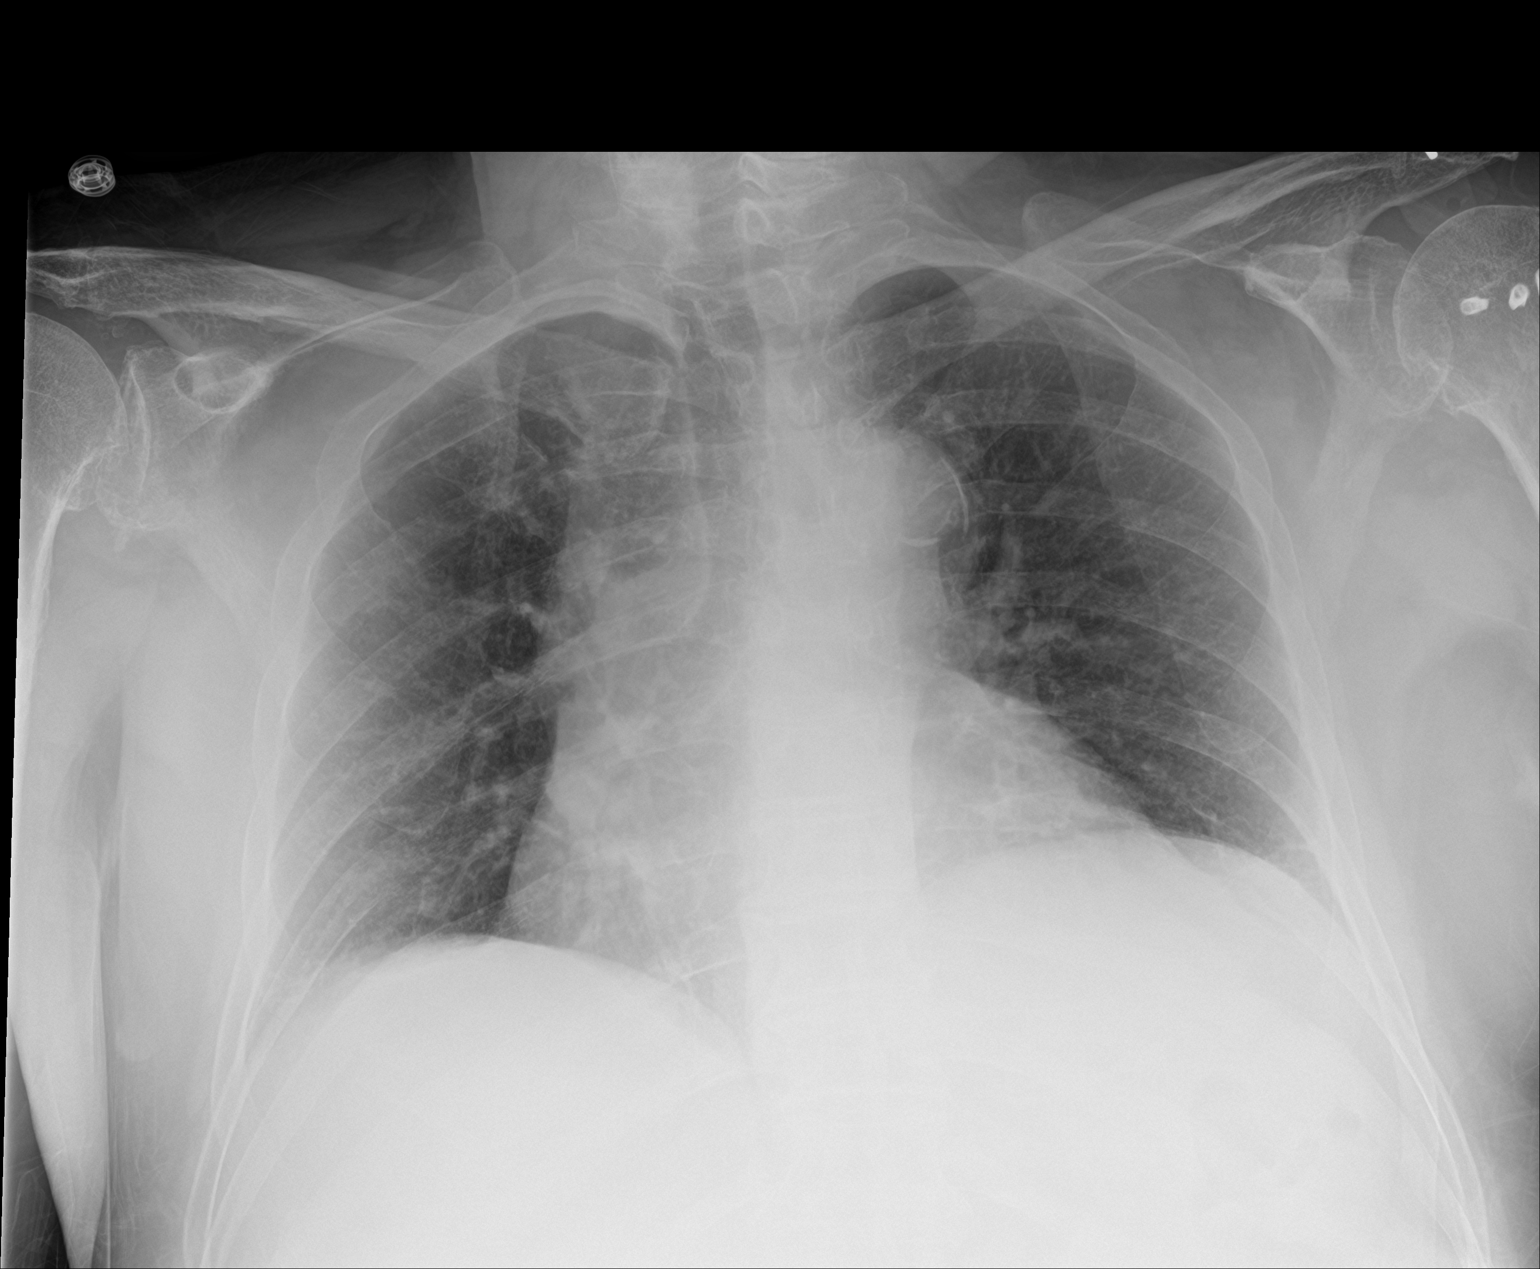

[1 of 1 positions shown; findings below may reference images not displayed]

FINDINGS: Low lung volumes. Cardiomegaly with tortuous atherosclerotic
thoracic aorta. No pulmonary edema. No pneumothorax or confluent
airspace disease. Mild bibasilar atelectasis. No pleural effusion.
Degenerative change in both shoulders.
IMPRESSION: 1. Low lung volumes without acute abnormality.
2. Chronic cardiomegaly with tortuous atherosclerotic aorta.

## 2017-11-10 ENCOUNTER — Other Ambulatory Visit: Payer: Self-pay

## 2017-11-10 ENCOUNTER — Ambulatory Visit (INDEPENDENT_AMBULATORY_CARE_PROVIDER_SITE_OTHER): Payer: PPO

## 2017-11-10 DIAGNOSIS — I38 Endocarditis, valve unspecified: Secondary | ICD-10-CM | POA: Diagnosis not present

## 2017-11-15 ENCOUNTER — Telehealth: Payer: Self-pay | Admitting: *Deleted

## 2017-11-15 NOTE — Telephone Encounter (Signed)
Pt aware and voiced understanding - routed to pcp  

## 2017-11-15 NOTE — Telephone Encounter (Signed)
-----   Message from Charlie Pitter, PA-C sent at 11/11/2017 12:00 PM EDT ----- Covering Dr. Nelly Laurence box. Echo done to f/u AS and recent endocarditis. Please let patient know echo overall looks stable with normal heart function. The echocardiogram suggested there is some stiffening of the heart muscle, which is what we call diastolic dysfunction. In addition to good blood pressure control and achieving or maintaining a healthy weight, would advise to generally stick to lower sodium diet (aiming for maximum 2,000mg  per day) and staying hydrated but not to excess. No definite vegetations (infections) seen on heart valve. F/u as planned. Dayna Dunn PA-C

## 2017-11-16 ENCOUNTER — Other Ambulatory Visit: Payer: Self-pay | Admitting: Cardiology

## 2017-11-16 DIAGNOSIS — I10 Essential (primary) hypertension: Secondary | ICD-10-CM | POA: Diagnosis not present

## 2017-11-17 LAB — BASIC METABOLIC PANEL
BUN/Creatinine Ratio: 13 (ref 10–24)
BUN: 24 mg/dL (ref 8–27)
CALCIUM: 9 mg/dL (ref 8.6–10.2)
CHLORIDE: 106 mmol/L (ref 96–106)
CO2: 23 mmol/L (ref 20–29)
Creatinine, Ser: 1.85 mg/dL — ABNORMAL HIGH (ref 0.76–1.27)
GFR calc Af Amer: 37 mL/min/{1.73_m2} — ABNORMAL LOW (ref >59)
GFR, EST NON AFRICAN AMERICAN: 32 mL/min/{1.73_m2} — AB (ref >59)
Glucose: 119 mg/dL — ABNORMAL HIGH (ref 65–99)
POTASSIUM: 4.1 mmol/L (ref 3.5–5.2)
SODIUM: 141 mmol/L (ref 134–144)

## 2017-11-17 LAB — MAGNESIUM: Magnesium: 2 mg/dL (ref 1.6–2.3)

## 2017-11-17 LAB — SPECIMEN STATUS REPORT

## 2017-11-22 ENCOUNTER — Telehealth: Payer: Self-pay | Admitting: *Deleted

## 2017-11-22 DIAGNOSIS — I10 Essential (primary) hypertension: Secondary | ICD-10-CM

## 2017-11-22 NOTE — Telephone Encounter (Signed)
-----   Message from [name], MD sent at 11/22/2017  3:19 PM EDT ----- Mild decrease in kidney function, overall similar to prior labs. Would continue current meds, repeat BMET/Mg in 6 weeks   Zandra Abts MD

## 2017-11-22 NOTE — Telephone Encounter (Signed)
Pt aware and voiced understanding - will mail lab orders

## 2017-12-01 DIAGNOSIS — N189 Chronic kidney disease, unspecified: Secondary | ICD-10-CM | POA: Diagnosis not present

## 2017-12-01 DIAGNOSIS — M84459A Pathological fracture, hip, unspecified, initial encounter for fracture: Secondary | ICD-10-CM | POA: Diagnosis not present

## 2017-12-01 DIAGNOSIS — J449 Chronic obstructive pulmonary disease, unspecified: Secondary | ICD-10-CM | POA: Diagnosis not present

## 2017-12-23 DIAGNOSIS — R944 Abnormal results of kidney function studies: Secondary | ICD-10-CM | POA: Diagnosis not present

## 2017-12-26 ENCOUNTER — Other Ambulatory Visit: Payer: Self-pay | Admitting: Cardiology

## 2017-12-26 DIAGNOSIS — F339 Major depressive disorder, recurrent, unspecified: Secondary | ICD-10-CM | POA: Diagnosis not present

## 2017-12-26 DIAGNOSIS — I129 Hypertensive chronic kidney disease with stage 1 through stage 4 chronic kidney disease, or unspecified chronic kidney disease: Secondary | ICD-10-CM | POA: Diagnosis not present

## 2017-12-26 DIAGNOSIS — G4700 Insomnia, unspecified: Secondary | ICD-10-CM | POA: Diagnosis not present

## 2017-12-26 DIAGNOSIS — E1122 Type 2 diabetes mellitus with diabetic chronic kidney disease: Secondary | ICD-10-CM | POA: Diagnosis not present

## 2017-12-26 DIAGNOSIS — E782 Mixed hyperlipidemia: Secondary | ICD-10-CM | POA: Diagnosis not present

## 2017-12-26 DIAGNOSIS — I2510 Atherosclerotic heart disease of native coronary artery without angina pectoris: Secondary | ICD-10-CM | POA: Diagnosis not present

## 2017-12-26 DIAGNOSIS — M7020 Olecranon bursitis, unspecified elbow: Secondary | ICD-10-CM | POA: Diagnosis not present

## 2017-12-26 DIAGNOSIS — J441 Chronic obstructive pulmonary disease with (acute) exacerbation: Secondary | ICD-10-CM | POA: Diagnosis not present

## 2017-12-26 DIAGNOSIS — N183 Chronic kidney disease, stage 3 (moderate): Secondary | ICD-10-CM | POA: Diagnosis not present

## 2017-12-26 DIAGNOSIS — I10 Essential (primary) hypertension: Secondary | ICD-10-CM | POA: Diagnosis not present

## 2017-12-26 DIAGNOSIS — H409 Unspecified glaucoma: Secondary | ICD-10-CM | POA: Diagnosis not present

## 2017-12-26 DIAGNOSIS — N401 Enlarged prostate with lower urinary tract symptoms: Secondary | ICD-10-CM | POA: Diagnosis not present

## 2017-12-27 LAB — BASIC METABOLIC PANEL
BUN / CREAT RATIO: 12 (ref 10–24)
BUN: 21 mg/dL (ref 8–27)
CALCIUM: 9.3 mg/dL (ref 8.6–10.2)
CO2: 22 mmol/L (ref 20–29)
CREATININE: 1.73 mg/dL — AB (ref 0.76–1.27)
Chloride: 106 mmol/L (ref 96–106)
GFR, EST AFRICAN AMERICAN: 40 mL/min/{1.73_m2} — AB (ref >59)
GFR, EST NON AFRICAN AMERICAN: 34 mL/min/{1.73_m2} — AB (ref >59)
Glucose: 134 mg/dL — ABNORMAL HIGH (ref 65–99)
Potassium: 4.5 mmol/L (ref 3.5–5.2)
Sodium: 143 mmol/L (ref 134–144)

## 2017-12-27 LAB — MAGNESIUM: Magnesium: 2 mg/dL (ref 1.6–2.3)

## 2017-12-31 DIAGNOSIS — M84459A Pathological fracture, hip, unspecified, initial encounter for fracture: Secondary | ICD-10-CM | POA: Diagnosis not present

## 2017-12-31 DIAGNOSIS — N189 Chronic kidney disease, unspecified: Secondary | ICD-10-CM | POA: Diagnosis not present

## 2017-12-31 DIAGNOSIS — J449 Chronic obstructive pulmonary disease, unspecified: Secondary | ICD-10-CM | POA: Diagnosis not present

## 2018-01-02 DIAGNOSIS — M1712 Unilateral primary osteoarthritis, left knee: Secondary | ICD-10-CM | POA: Diagnosis not present

## 2018-01-02 DIAGNOSIS — I7090 Unspecified atherosclerosis: Secondary | ICD-10-CM | POA: Diagnosis not present

## 2018-01-02 DIAGNOSIS — S7222XD Displaced subtrochanteric fracture of left femur, subsequent encounter for closed fracture with routine healing: Secondary | ICD-10-CM | POA: Diagnosis not present

## 2018-01-02 DIAGNOSIS — M1612 Unilateral primary osteoarthritis, left hip: Secondary | ICD-10-CM | POA: Diagnosis not present

## 2018-01-02 DIAGNOSIS — M47818 Spondylosis without myelopathy or radiculopathy, sacral and sacrococcygeal region: Secondary | ICD-10-CM | POA: Diagnosis not present

## 2018-01-02 DIAGNOSIS — Z8781 Personal history of (healed) traumatic fracture: Secondary | ICD-10-CM | POA: Diagnosis not present

## 2018-01-02 DIAGNOSIS — Z87891 Personal history of nicotine dependence: Secondary | ICD-10-CM | POA: Diagnosis not present

## 2018-01-27 DIAGNOSIS — M19049 Primary osteoarthritis, unspecified hand: Secondary | ICD-10-CM | POA: Diagnosis not present

## 2018-01-27 DIAGNOSIS — I10 Essential (primary) hypertension: Secondary | ICD-10-CM | POA: Diagnosis not present

## 2018-01-27 DIAGNOSIS — M7020 Olecranon bursitis, unspecified elbow: Secondary | ICD-10-CM | POA: Diagnosis not present

## 2018-01-27 DIAGNOSIS — N183 Chronic kidney disease, stage 3 (moderate): Secondary | ICD-10-CM | POA: Diagnosis not present

## 2018-01-27 DIAGNOSIS — R079 Chest pain, unspecified: Secondary | ICD-10-CM | POA: Diagnosis not present

## 2018-01-31 DIAGNOSIS — N189 Chronic kidney disease, unspecified: Secondary | ICD-10-CM | POA: Diagnosis not present

## 2018-01-31 DIAGNOSIS — M84459A Pathological fracture, hip, unspecified, initial encounter for fracture: Secondary | ICD-10-CM | POA: Diagnosis not present

## 2018-01-31 DIAGNOSIS — J449 Chronic obstructive pulmonary disease, unspecified: Secondary | ICD-10-CM | POA: Diagnosis not present

## 2018-02-02 DIAGNOSIS — E1122 Type 2 diabetes mellitus with diabetic chronic kidney disease: Secondary | ICD-10-CM | POA: Diagnosis not present

## 2018-02-02 DIAGNOSIS — E782 Mixed hyperlipidemia: Secondary | ICD-10-CM | POA: Diagnosis not present

## 2018-02-02 DIAGNOSIS — J441 Chronic obstructive pulmonary disease with (acute) exacerbation: Secondary | ICD-10-CM | POA: Diagnosis not present

## 2018-02-02 DIAGNOSIS — F339 Major depressive disorder, recurrent, unspecified: Secondary | ICD-10-CM | POA: Diagnosis not present

## 2018-02-02 DIAGNOSIS — I129 Hypertensive chronic kidney disease with stage 1 through stage 4 chronic kidney disease, or unspecified chronic kidney disease: Secondary | ICD-10-CM | POA: Diagnosis not present

## 2018-02-02 DIAGNOSIS — I6529 Occlusion and stenosis of unspecified carotid artery: Secondary | ICD-10-CM | POA: Diagnosis not present

## 2018-02-02 DIAGNOSIS — R944 Abnormal results of kidney function studies: Secondary | ICD-10-CM | POA: Diagnosis not present

## 2018-02-02 DIAGNOSIS — I10 Essential (primary) hypertension: Secondary | ICD-10-CM | POA: Diagnosis not present

## 2018-02-02 DIAGNOSIS — R600 Localized edema: Secondary | ICD-10-CM | POA: Diagnosis not present

## 2018-02-02 DIAGNOSIS — I2510 Atherosclerotic heart disease of native coronary artery without angina pectoris: Secondary | ICD-10-CM | POA: Diagnosis not present

## 2018-02-09 ENCOUNTER — Ambulatory Visit: Payer: PPO | Admitting: Orthopaedic Surgery

## 2018-02-09 ENCOUNTER — Ambulatory Visit (INDEPENDENT_AMBULATORY_CARE_PROVIDER_SITE_OTHER): Payer: PPO

## 2018-02-09 ENCOUNTER — Encounter: Payer: Self-pay | Admitting: Orthopaedic Surgery

## 2018-02-09 ENCOUNTER — Encounter: Payer: Self-pay | Admitting: Gastroenterology

## 2018-02-09 VITALS — BP 157/81 | HR 78 | Ht 66.5 in | Wt 192.0 lb

## 2018-02-09 DIAGNOSIS — M25421 Effusion, right elbow: Secondary | ICD-10-CM

## 2018-02-09 DIAGNOSIS — M7021 Olecranon bursitis, right elbow: Secondary | ICD-10-CM | POA: Diagnosis not present

## 2018-02-09 NOTE — Progress Notes (Signed)
Subjective:    Patient ID: Anthony Murray, male    DOB: 07-24-1927, 84 y.o.   MRN: [id number]  HPI He is seen today for evaluation of an olecranon bursa on the right elbow. It has been present about three to four weeks.  He denies any trauma, denies redness or pain.  It just bothers him when he puts his elbow on something, like his easy chair arm.  He has no numbness.   Review of Systems  Constitutional: Positive for activity change.  Musculoskeletal: Positive for arthralgias.  All other systems reviewed and are negative.  For Review of Systems, all other systems reviewed and are negative.  The following is a summary of the past history medically, past history surgically, known current medicines, social history and family history.  This information is gathered electronically by the computer from prior information and documentation.  I review this each visit and have found including this information at this point in the chart is beneficial and informative.   Past Medical History:  Diagnosis Date  . Aortic valve stenosis   . ASCVD (arteriosclerotic cardiovascular disease)    50% ostial left left anterior desending 60% mid circumflex and normal ejection fractioon in 2006 exertional dyspnea;history of chest discomfort  . Cerebrovascular disease    minimal carotid bruits  . CKD (chronic kidney disease)   . Diabetes mellitus type 2, controlled ([place])    no insulin  . Hyperlipidemia    statin intolerant  . Hypertension   . Tobacco abuse, in remission    30 pack years stopped in 1971    Past Surgical History:  Procedure Laterality Date  . APPENDECTOMY    . CARPAL TUNNEL RELEASE     surgery left 05/05/2001  . COLONOSCOPY N/A 10/01/2017   Procedure: COLONOSCOPY;  Surgeon: Jackquline Denmark, MD;  Location: [facility] ENDOSCOPY;  Service: Endoscopy;  Laterality: N/A;  . KNEE ARTHROSCOPY     bilaterial; unknown associated surgical procedure  . SHOULDER SURGERY     Right x2; third procedure on left   . TEE WITHOUT CARDIOVERSION N/A 07/29/2017   Procedure: TRANSESOPHAGEAL ECHOCARDIOGRAM (TEE) WITH PROPOFOL;  Surgeon: Satira Sark, MD;  Location: [facility] ENDO SUITE;  Service: Cardiovascular;  Laterality: N/A;  . TONSILLECTOMY    . TRANSURETHRAL INCISION OF PROSTATE     resection of the prostate    Current Outpatient Medications on File Prior to Visit  Medication Sig Dispense Refill  . acetaminophen (TYLENOL) 325 MG tablet Take 325-650 mg by mouth every 6 (six) hours as needed (for pain).     Marland Kitchen albuterol (PROVENTIL HFA;VENTOLIN HFA) 108 (90 Base) MCG/ACT inhaler Inhale 2 puffs into the lungs every 4 (four) hours as needed for wheezing or shortness of breath.     Marland Kitchen aspirin 81 MG tablet Take 1 tablet (81 mg total) by mouth daily. 30 tablet 0  . betaxolol (BETOPTIC-S) 0.5 % ophthalmic suspension Place 1 drop into both eyes every morning.     . bimatoprost (LUMIGAN) 0.01 % SOLN Place 1 drop into both eyes every morning.     . carvedilol (COREG) 12.5 MG tablet Take 12.5 mg by mouth 2 (two) times daily with a meal.     . Cranberry 500 MG CAPS Take 500 mg by mouth every evening.     . dorzolamide (TRUSOPT) 2 % ophthalmic solution Place 1 drop into both eyes 2 (two) times daily.     . fexofenadine (ALLEGRA) 180 MG tablet Take 180 mg by mouth daily.    Marland Kitchen  Fluticasone-Umeclidin-Vilant (TRELEGY ELLIPTA) 100-62.5-25 MCG/INH AEPB Inhale 1 puff into the lungs daily.    . furosemide (LASIX) 40 MG tablet Take 40 mg by mouth. TAKE 40 MG EVERY OTHER DAY ALTERNATING WITH 20 MG    . glipiZIDE (GLUCOTROL XL) 5 MG 24 hr tablet Take 5 mg by mouth 2 (two) times daily.    Mckinley Jewel Dimesylate (RHOPRESSA) 0.02 % SOLN Place 1 drop into the left eye at bedtime.     Marland Kitchen oxybutynin (DITROPAN) 5 MG tablet Take 5 mg by mouth 2 (two) times daily.    . pilocarpine (PILOCAR) 2 % ophthalmic solution Place 1 drop into the left eye 4 (four) times daily.     . polyethylene glycol (MIRALAX / GLYCOLAX) packet Take 17 g by mouth  daily. 14 each 0  . potassium chloride (K-DUR) 10 MEQ tablet Take 10 mEq by mouth daily. ONLY WHILE TAKING LASIX    . sitaGLIPtin (JANUVIA) 100 MG tablet Take 50 mg by mouth daily.    . tamsulosin (FLOMAX) 0.4 MG CAPS capsule Take 0.4 mg by mouth daily.    . Calcium-Magnesium-Vitamin D (CALCIUM 1200+D3 PO) Take 1 tablet by mouth daily.    Marland Kitchen ipratropium-albuterol (DUONEB) 0.5-2.5 (3) MG/3ML SOLN Take 3 mLs by nebulization every 4 (four) hours as needed. (Patient not taking: Reported on 02/09/2018) 360 mL 0  . Omega-3 Fatty Acids (FISH OIL) 1000 MG CAPS Take 1,000 mg by mouth 2 (two) times daily.     . pantoprazole (PROTONIX) 40 MG tablet Take 1 tablet (40 mg total) by mouth 2 (two) times daily. (Patient not taking: Reported on 02/09/2018) 60 tablet 0   No current facility-administered medications on file prior to visit.     Social History   Socioeconomic History  . Marital status: Married    Spouse name: Not on file  . Number of children: 4  . Years of education: Not on file  . Highest education level: Not on file  Occupational History  . Occupation: retired    Fish farm manager: RETIRED    Comment: [employer]  Social Needs  . Financial resource strain: Not on file  . Food insecurity:    Worry: Not on file    Inability: Not on file  . Transportation needs:    Medical: Not on file    Non-medical: Not on file  Tobacco Use  . Smoking status: Former Smoker    Packs/day: 1.00    Years: 40.00    Pack years: 40.00    Types: Cigarettes    Last attempt to quit: 10/29/1969    Years since quitting: 48.3  . Smokeless tobacco: Never Used  Substance and Sexual Activity  . Alcohol use: No    Alcohol/week: 0.0 standard drinks  . Drug use: No  . Sexual activity: Not on file  Lifestyle  . Physical activity:    Days per week: Not on file    Minutes per session: Not on file  . Stress: Not on file  Relationships  . Social connections:    Talks on phone: Not on file    Gets together: Not on  file    Attends religious service: Not on file    Active member of club or organization: Not on file    Attends meetings of clubs or organizations: Not on file    Relationship status: Not on file  . Intimate partner violence:    Fear of current or ex partner: Not on file    Emotionally abused:  Not on file    Physically abused: Not on file    Forced sexual activity: Not on file  Other Topics Concern  . Not on file  Social History Narrative  . Not on file    Family History  Problem Relation Age of Onset  . Multiple sclerosis Mother   . Heart disease Father   . Peripheral vascular disease Sister     BP (!) 157/81   Pulse 78   Ht 5' 6.5" (1.689 m)   Wt 192 lb (87.1 kg)   BMI 30.53 kg/m   Body mass index is 30.53 kg/m.      Objective:   Physical Exam  Constitutional: He is oriented to person, place, and time. He appears well-developed and well-nourished.  HENT:  Head: Normocephalic and atraumatic.  Eyes: Pupils are equal, round, and reactive to light. Conjunctivae and EOM are normal.  Neck: Normal range of motion. Neck supple.  Cardiovascular: Normal rate, regular rhythm and intact distal pulses.  Pulmonary/Chest: Effort normal.  Abdominal: Soft.  Musculoskeletal:       Right elbow: He exhibits swelling and deformity. Tenderness found. Olecranon process tenderness noted.       Arms: Neurological: He is alert and oriented to person, place, and time. He has normal reflexes. He displays normal reflexes. No cranial nerve deficit. He exhibits normal muscle tone. Coordination normal.  Skin: Skin is warm and dry.  Psychiatric: He has a normal mood and affect. His behavior is normal. Judgment and thought content normal.    X-rays were done of the right elbow, reported separately.      Assessment & Plan:   Encounter Diagnoses  Name Primary?  . Elbow swelling, right Yes  . Olecranon bursitis, right elbow    Procedure note: After permission from the patient and  sterile prep, I aspirated about 5 cc of thick dark blood from the right olecranon bursa and also expressed about another 5cc.  I cc DepoMedrol was instilled.  This was done by sterile technique and tolerated well.  I will see him in two weeks.  It may recur.  He may need to consider surgical excision.  Call if any problem.  Precautions discussed.   Electronically Signed Sanjuana Kava, MD 8/22/20192:11 PM

## 2018-02-13 DIAGNOSIS — M79674 Pain in right toe(s): Secondary | ICD-10-CM | POA: Diagnosis not present

## 2018-02-13 DIAGNOSIS — B351 Tinea unguium: Secondary | ICD-10-CM | POA: Diagnosis not present

## 2018-02-13 DIAGNOSIS — M79675 Pain in left toe(s): Secondary | ICD-10-CM | POA: Diagnosis not present

## 2018-02-13 DIAGNOSIS — E1151 Type 2 diabetes mellitus with diabetic peripheral angiopathy without gangrene: Secondary | ICD-10-CM | POA: Diagnosis not present

## 2018-02-16 ENCOUNTER — Telehealth: Payer: Self-pay | Admitting: Cardiology

## 2018-02-16 MED ORDER — AMLODIPINE BESYLATE 5 MG PO TABS: 5.0000 mg | ORAL_TABLET | Freq: Every day | ORAL | 3 refills | Status: DC

## 2018-02-16 NOTE — Telephone Encounter (Signed)
Given his decreased kidney function best option would be to start norvac 5mg  daily. Have him update [place] on his bp's in 2 weeks.   J Lenville Hibberd MD

## 2018-02-16 NOTE — Telephone Encounter (Signed)
Returned call to [place]. She stated that the patient has called in and reported his blood pressures running in the 140's to 160's / 80's for a few weeks now. Dr. Juel Burrow office asks if his blood pressure medication dose needs to be higher. They wanted Dr. Nelly Laurence input. Please advise.

## 2018-02-16 NOTE — Telephone Encounter (Signed)
Pt's wife notified of medication being called in for blood pressure to [facility].  Advised her to be sure he takes his blood pressure daily and keep [place] informed. She voiced understanding.

## 2018-02-16 NOTE — Telephone Encounter (Signed)
Please call [facility] -- pharmacist w/ Dr. Durene Cal office @ [phone]   She has a question concerning pt's medication and his BP

## 2018-02-23 ENCOUNTER — Encounter: Payer: Self-pay | Admitting: Orthopaedic Surgery

## 2018-02-23 ENCOUNTER — Ambulatory Visit: Payer: PPO | Admitting: Orthopaedic Surgery

## 2018-02-23 VITALS — BP 144/77 | HR 73 | Ht 66.5 in | Wt 192.0 lb

## 2018-02-23 DIAGNOSIS — M7021 Olecranon bursitis, right elbow: Secondary | ICD-10-CM | POA: Diagnosis not present

## 2018-02-23 DIAGNOSIS — M25421 Effusion, right elbow: Secondary | ICD-10-CM | POA: Diagnosis not present

## 2018-02-23 NOTE — Progress Notes (Signed)
CC:  My elbow is much better  He has no further swelling of the right olecranon bursa area. He has no pain.  He has full ROM of the right elbow, NV intact.  Encounter Diagnoses  Name Primary?  . Elbow swelling, right Yes  . Olecranon bursitis, right elbow    I will see him as needed.  Call if any problem.  Precautions discussed.   Electronically Signed Sanjuana Kava, MD 9/5/20199:04 AM

## 2018-03-01 DIAGNOSIS — E1122 Type 2 diabetes mellitus with diabetic chronic kidney disease: Secondary | ICD-10-CM | POA: Diagnosis not present

## 2018-03-01 DIAGNOSIS — I129 Hypertensive chronic kidney disease with stage 1 through stage 4 chronic kidney disease, or unspecified chronic kidney disease: Secondary | ICD-10-CM | POA: Diagnosis not present

## 2018-03-01 DIAGNOSIS — J441 Chronic obstructive pulmonary disease with (acute) exacerbation: Secondary | ICD-10-CM | POA: Diagnosis not present

## 2018-03-01 DIAGNOSIS — N183 Chronic kidney disease, stage 3 (moderate): Secondary | ICD-10-CM | POA: Diagnosis not present

## 2018-03-01 DIAGNOSIS — E782 Mixed hyperlipidemia: Secondary | ICD-10-CM | POA: Diagnosis not present

## 2018-03-01 DIAGNOSIS — M1990 Unspecified osteoarthritis, unspecified site: Secondary | ICD-10-CM | POA: Diagnosis not present

## 2018-03-01 DIAGNOSIS — M19049 Primary osteoarthritis, unspecified hand: Secondary | ICD-10-CM | POA: Diagnosis not present

## 2018-03-01 DIAGNOSIS — I2510 Atherosclerotic heart disease of native coronary artery without angina pectoris: Secondary | ICD-10-CM | POA: Diagnosis not present

## 2018-03-01 DIAGNOSIS — F339 Major depressive disorder, recurrent, unspecified: Secondary | ICD-10-CM | POA: Diagnosis not present

## 2018-03-01 DIAGNOSIS — I10 Essential (primary) hypertension: Secondary | ICD-10-CM | POA: Diagnosis not present

## 2018-03-03 DIAGNOSIS — J449 Chronic obstructive pulmonary disease, unspecified: Secondary | ICD-10-CM | POA: Diagnosis not present

## 2018-03-03 DIAGNOSIS — M84459A Pathological fracture, hip, unspecified, initial encounter for fracture: Secondary | ICD-10-CM | POA: Diagnosis not present

## 2018-03-03 DIAGNOSIS — N189 Chronic kidney disease, unspecified: Secondary | ICD-10-CM | POA: Diagnosis not present

## 2018-03-13 ENCOUNTER — Encounter: Payer: Self-pay | Admitting: *Deleted

## 2018-03-13 ENCOUNTER — Ambulatory Visit (INDEPENDENT_AMBULATORY_CARE_PROVIDER_SITE_OTHER): Payer: PPO | Admitting: Cardiology

## 2018-03-13 ENCOUNTER — Encounter: Payer: Self-pay | Admitting: Cardiology

## 2018-03-13 VITALS — BP 125/69 | HR 76 | Ht 66.5 in | Wt 196.0 lb

## 2018-03-13 DIAGNOSIS — S72012D Unspecified intracapsular fracture of left femur, subsequent encounter for closed fracture with routine healing: Secondary | ICD-10-CM | POA: Diagnosis not present

## 2018-03-13 DIAGNOSIS — Z23 Encounter for immunization: Secondary | ICD-10-CM | POA: Diagnosis not present

## 2018-03-13 DIAGNOSIS — E1165 Type 2 diabetes mellitus with hyperglycemia: Secondary | ICD-10-CM | POA: Diagnosis not present

## 2018-03-13 DIAGNOSIS — I129 Hypertensive chronic kidney disease with stage 1 through stage 4 chronic kidney disease, or unspecified chronic kidney disease: Secondary | ICD-10-CM | POA: Diagnosis not present

## 2018-03-13 DIAGNOSIS — I10 Essential (primary) hypertension: Secondary | ICD-10-CM | POA: Diagnosis not present

## 2018-03-13 DIAGNOSIS — I2510 Atherosclerotic heart disease of native coronary artery without angina pectoris: Secondary | ICD-10-CM | POA: Diagnosis not present

## 2018-03-13 DIAGNOSIS — I6523 Occlusion and stenosis of bilateral carotid arteries: Secondary | ICD-10-CM

## 2018-03-13 DIAGNOSIS — E782 Mixed hyperlipidemia: Secondary | ICD-10-CM

## 2018-03-13 DIAGNOSIS — I6529 Occlusion and stenosis of unspecified carotid artery: Secondary | ICD-10-CM | POA: Diagnosis not present

## 2018-03-13 DIAGNOSIS — I350 Nonrheumatic aortic (valve) stenosis: Secondary | ICD-10-CM

## 2018-03-13 DIAGNOSIS — R339 Retention of urine, unspecified: Secondary | ICD-10-CM | POA: Diagnosis not present

## 2018-03-13 DIAGNOSIS — E1151 Type 2 diabetes mellitus with diabetic peripheral angiopathy without gangrene: Secondary | ICD-10-CM | POA: Diagnosis not present

## 2018-03-13 DIAGNOSIS — N400 Enlarged prostate without lower urinary tract symptoms: Secondary | ICD-10-CM | POA: Diagnosis not present

## 2018-03-13 DIAGNOSIS — R4189 Other symptoms and signs involving cognitive functions and awareness: Secondary | ICD-10-CM | POA: Diagnosis not present

## 2018-03-13 DIAGNOSIS — S72099A Other fracture of head and neck of unspecified femur, initial encounter for closed fracture: Secondary | ICD-10-CM | POA: Diagnosis not present

## 2018-03-13 DIAGNOSIS — Z6830 Body mass index (BMI) 30.0-30.9, adult: Secondary | ICD-10-CM | POA: Diagnosis not present

## 2018-03-13 DIAGNOSIS — Z7984 Long term (current) use of oral hypoglycemic drugs: Secondary | ICD-10-CM | POA: Diagnosis not present

## 2018-03-13 DIAGNOSIS — R944 Abnormal results of kidney function studies: Secondary | ICD-10-CM | POA: Diagnosis not present

## 2018-03-13 NOTE — Progress Notes (Signed)
Clinical Summary Mr. Tebbetts is a 84 y.o.male seen today for follow up of the following medical problems.   1. Aortic stenosis - 08/2014 echo LVEF 55-60%, mod to severe AS (mean grad 11, AVA0.99) 08/2015 echo LVEF 43-15%, grade I diastolic dysfunction, mean grad 10, dimensionless index 0.39, AVA VTI 0.88 - 07/2017 echo mean grad 11, dimensionless index .35, AVA VTI 0.88 - 07/2017 TEE AVA planimetry 1.44 - 10/2017 TTE mean grad 12, AVA VTI 1.27 - breathing improved with previouschange in inhalers.    - no recent SOB. No recent edema.   2. COPD - followed by pcp  3. CAD - cath 11/2004 with moderate non-obstructive disease - echo 09/2012 LVEF 55-60%  -2 episodes chest pain. Unsure of character of pain, moderate to severe. Lasted about 10 minutes. No other associated symptoms. Not positional. Last episode about 3 weeks.   4. HL - reports statins have caused muscle aches including pravastatin   - labs followed by pcp  5. Carotid stenosis 07/2017 Carotid US mild RICA, LICA 40-08% - no significant neuro symptoms   6. HTN - recent high bp's, we started norvacs 37m daily.  - home bp's 120s-150s/60-90s, he tends to check first thing in the morning.   7. CKDIII - followed by pcp  8. Endocarditis - small vegetations noted on MV and AV during 07/2017 admission - ID followed, he completed  4 weeks of vanc and rocephin   10/2017 echo without clear vegetations  9. CVA - ASA was increased to bid dosingfrom hospital notes - back to 865mdaily due to recent GI bleeding.    10. GI bleed - admitted 10/03/17 - no recent issues   Past Medical History:  Diagnosis Date  . Aortic valve stenosis   . ASCVD (arteriosclerotic cardiovascular disease)    50% ostial left left anterior desending 60% mid circumflex and normal ejection fractioon in 2006 exertional dyspnea;history of chest discomfort  . Cerebrovascular disease    minimal carotid bruits  . CKD (chronic  kidney disease)   . [place]   [place]  . Hyperlipidemia    statin intolerant  . Hypertension   . Tobacco abuse, in remission    30 pack years stopped in 1971     Allergies  Allergen Reactions  . Codeine Other (See Comments)    Reaction not recalled  . Ezetimibe-Simvastatin Other (See Comments)    Muscle pain  . Naproxen Other (See Comments)    Reaction not recalled  . Niacin Other (See Comments)    Reaction not recalled  . Nsaids Other (See Comments)    Reaction not recalled  . Pravastatin Sodium Other (See Comments)    Muscle pain     Current Outpatient Medications  Medication Sig Dispense Refill  . acetaminophen (TYLENOL) 325 MG tablet Take 325-650 mg by mouth every 6 (six) hours as needed (for pain).     . Marland Kitchenlbuterol (PROVENTIL HFA;VENTOLIN HFA) 108 (90 Base) MCG/ACT inhaler Inhale 2 puffs into the lungs every 4 (four) hours as needed for wheezing or shortness of breath.     . Marland KitchenmLODipine (NORVASC) 5 MG tablet Take 1 tablet (5 mg total) by mouth daily. 180 tablet 3  . aspirin 81 MG tablet Take 1 tablet (81 mg total) by mouth daily. 30 tablet 0  . betaxolol (BETOPTIC-S) 0.5 % ophthalmic suspension Place 1 drop into both eyes every morning.     . bimatoprost (LUMIGAN) 0.01 % SOLN  Place 1 drop into both eyes every morning.     . Calcium-Magnesium-Vitamin D (CALCIUM 1200+D3 PO) Take 1 tablet by mouth daily.    . carvedilol (COREG) 12.5 MG tablet Take 12.5 mg by mouth 2 (two) times daily with a meal.     . Cranberry 500 MG CAPS Take 500 mg by mouth every evening.     . dorzolamide (TRUSOPT) 2 % ophthalmic solution Place 1 drop into both eyes 2 (two) times daily.     . fexofenadine (ALLEGRA) 180 MG tablet Take 180 mg by mouth daily.    . Fluticasone-Umeclidin-Vilant (TRELEGY ELLIPTA) 100-62.5-25 MCG/INH AEPB Inhale 1 puff into the lungs daily.    . furosemide (LASIX) 40 MG tablet Take 40 mg by mouth. TAKE 40 MG EVERY OTHER DAY ALTERNATING  WITH 20 MG    . glipiZIDE (GLUCOTROL XL) 5 MG 24 hr tablet Take 5 mg by mouth 2 (two) times daily.    Marland Kitchen ipratropium-albuterol (DUONEB) 0.5-2.5 (3) MG/3ML SOLN Take 3 mLs by nebulization every 4 (four) hours as needed. (Patient not taking: Reported on 02/09/2018) 360 mL 0  . Netarsudil Dimesylate (RHOPRESSA) 0.02 % SOLN Place 1 drop into the left eye at bedtime.     . Omega-3 Fatty Acids (FISH OIL) 1000 MG CAPS Take 1,000 mg by mouth 2 (two) times daily.     Marland Kitchen oxybutynin (DITROPAN) 5 MG tablet Take 5 mg by mouth 2 (two) times daily.    . pantoprazole (PROTONIX) 40 MG tablet Take 1 tablet (40 mg total) by mouth 2 (two) times daily. (Patient not taking: Reported on 02/09/2018) 60 tablet 0  . pilocarpine (PILOCAR) 2 % ophthalmic solution Place 1 drop into the left eye 4 (four) times daily.     . polyethylene glycol (MIRALAX / GLYCOLAX) packet Take 17 g by mouth daily. 14 each 0  . potassium chloride (K-DUR) 10 MEQ tablet Take 10 mEq by mouth daily. ONLY WHILE TAKING LASIX    . sitaGLIPtin (JANUVIA) 100 MG tablet Take 50 mg by mouth daily.    . tamsulosin (FLOMAX) 0.4 MG CAPS capsule Take 0.4 mg by mouth daily.     No current facility-administered medications for this visit.      Past Surgical History:  Procedure Laterality Date  . APPENDECTOMY    . CARPAL TUNNEL RELEASE     surgery left 05/05/2001  . COLONOSCOPY N/A 10/01/2017   Procedure: COLONOSCOPY;  Surgeon: Jackquline Denmark, MD;  Location: [facility] ENDOSCOPY;  Service: Endoscopy;  Laterality: N/A;  . KNEE ARTHROSCOPY     bilaterial; unknown associated surgical procedure  . SHOULDER SURGERY     Right x2; third procedure on left  . TEE WITHOUT CARDIOVERSION N/A 07/29/2017   Procedure: TRANSESOPHAGEAL ECHOCARDIOGRAM (TEE) WITH PROPOFOL;  Surgeon: Satira Sark, MD;  Location: [facility] ENDO SUITE;  Service: Cardiovascular;  Laterality: N/A;  . TONSILLECTOMY    . TRANSURETHRAL INCISION OF PROSTATE     resection of the prostate     Allergies    Allergen Reactions  . Codeine Other (See Comments)    Reaction not recalled  . Ezetimibe-Simvastatin Other (See Comments)    Muscle pain  . Naproxen Other (See Comments)    Reaction not recalled  . Niacin Other (See Comments)    Reaction not recalled  . Nsaids Other (See Comments)    Reaction not recalled  . Pravastatin Sodium Other (See Comments)    Muscle pain      Family History  Problem Relation  Age of Onset  . Multiple sclerosis Mother   . Heart disease Father   . Peripheral vascular disease Sister      Social History Mr. Litsey reports that he quit smoking about 48 years ago. His smoking use included cigarettes. He has a 40.00 pack-year smoking history. He has never used smokeless tobacco. Mr. Massi reports that he does not drink alcohol.   Review of Systems CONSTITUTIONAL: No weight loss, fever, chills, weakness or fatigue.  HEENT: Eyes: No visual loss, blurred vision, double vision or yellow sclerae.No hearing loss, sneezing, congestion, runny nose or sore throat.  SKIN: No rash or itching.  CARDIOVASCULAR: per hpi RESPIRATORY: No shortness of breath, cough or sputum.  GASTROINTESTINAL: No anorexia, nausea, vomiting or diarrhea. No abdominal pain or blood.  GENITOURINARY: No burning on urination, no polyuria NEUROLOGICAL: No headache, dizziness, syncope, paralysis, ataxia, numbness or tingling in the extremities. No change in bowel or bladder control.  MUSCULOSKELETAL: No muscle, back pain, joint pain or stiffness.  LYMPHATICS: No enlarged nodes. No history of splenectomy.  PSYCHIATRIC: No history of depression or anxiety.  ENDOCRINOLOGIC: No reports of sweating, cold or heat intolerance. No polyuria or polydipsia.  Marland Kitchen   Physical Examination Vitals:   03/13/18 1005  BP: 125/69  Pulse: 76  SpO2: 96%   Vitals:   03/13/18 1005  Weight: 196 lb (88.9 kg)  Height: 5' 6.5" (1.689 m)    Gen: resting comfortably, no acute distress HEENT: no scleral  icterus, pupils equal round and reactive, no palptable cervical adenopathy,  CV: RRR, 3/6 systolic murmur rusb, no jvd Resp: Clear to auscultation bilaterally GI: abdomen is soft, non-tender, non-distended, normal bowel sounds, no hepatosplenomegaly MSK: extremities are warm, no edema.  Skin: warm, no rash Neuro:  no focal deficits Psych: appropriate affect   Diagnostic Studies  09/2012 stress echo:  Study Conclusions  - Stress ECG conclusions: There were no significant stress arrhythmias or conduction abnormalities; rare PVCs were present during exercise and in recovery. The stress ECG was negative for ischemia. - Staged echo: There was no echocardiographic evidence for stress-induced ischemia.  09/2012 echo Study Conclusions  - Left ventricle: The cavity size was normal. Wall thickness was increased in a pattern of mild LVH. There was moderate focal basal hypertrophy of the septum. Systolic function was normal. The estimated ejection fraction was in the range of 55% to 60%. Wall motion was normal; there were no regional wall motion abnormalities. - Aortic valve: Mildly to moderately calcified annulus. Moderately thickened, moderately calcified leaflets. Valve mobility was mildly restricted. There was mild stenosis. Trivial regurgitation. Valve area: 0.9cm^2(VTI). Valve area: 0.79cm^2 (Vmax). - Aorta: The aorta was moderately calcified. - Mitral valve: Calcified annulus. Mildly calcified leaflets . - Atrial septum: No defect or patent foramen ovale was identified.  11/2004 Cath FINDINGS: 1. Patient does not have dextrocardia. The heart is normally located in  the left side of the chest. Aortic arch appears normal based on the  course of the catheter through it. 2. LV 167/19/25. 3. No aortic stenosis. 4. Left main: Angiographically normal. 5. LAD: Large vessel giving rise to a single, very large, diagonal.  There  is a 50% stenosis of the ostium of the LAD. 6. Circumflex: Large vessel giving rise to three obtuse marginals. There  is a 60% stenosis of the mid vessel. 7. RCA: Tortuous, dominant vessel. It is angiographically normal.  IMPRESSION: 1. No dextrocardia. 2. No previously placed coronary stent. 3. Moderate nonobstructive coronary disease. 4. Elevated left  ventricular end diastolic pressure in the setting of  systemic hypertension.  08/2014 echo Study Conclusions  - Left ventricle: The cavity size was normal. There was mild focal basal hypertrophy of the septum. Systolic function was normal. The estimated ejection fraction was in the range of 55% to 60%. Wall motion was normal; there were no regional wall motion abnormalities. Doppler parameters are consistent with abnormal left ventricular relaxation (grade 1 diastolic dysfunction). - Aortic valve: Functionally bicuspid; moderately calcified leaflets. Cusp separation was reduced. There was moderate to severe stenosis. There was mild regurgitation. Mean gradient (S): 11 mm Hg. Peak gradient (S): 22 mm Hg. VTI ratio of LVOT to aortic valve: 0.44. Valve area (VTI): 0.99 cm^2. - Mitral valve: Calcified annulus. There was trivial regurgitation. - Left atrium: The atrium was mildly dilated. - Right atrium: Central venous pressure (est): 8 mm Hg. - Tricuspid valve: There was trivial regurgitation. - Pulmonary arteries: Systolic pressure could not be accurately estimated. - Pericardium, extracardiac: There was no pericardial effusion.  Impressions:  - Mild basal septal hypertrophy with LVEF 55-60% and grade 1 diastolic dysfunction. Mild left atrial enlargement. Functionally bicuspid aortic valve with mild aortic regurgitation and moderate to severe aortic stenosis. Measures are somewhat discordant with relatively low gradients and LVOT/AV ratio 0.44, but planimetry and  VTI valve area around 1 cm2. Unable to assess PASP.  01/2015 PFTs Mild to moderate ventilatory defect   08/2015 echo  Study Conclusions  - Left ventricle: The cavity size was normal. Wall thickness was  increased in a pattern of mild LVH. Systolic function was normal.  The estimated ejection fraction was in the range of 60% to 65%.  Wall motion was normal; there were no regional wall motion  abnormalities. Doppler parameters are consistent with abnormal  left ventricular relaxation (grade 1 diastolic dysfunction). - Aortic valve: Mildly to moderately calcified annulus.  Functionally bicuspid. There was mild regurgitation. Mean  gradient (S): 10 mm Hg. Peak gradient (S): 20 mm Hg. VTI ratio of  LVOT to aortic valve: 0.39. Valve area (VTI): 0.88 cm^2. - Mitral valve: Calcified annulus. There was trivial regurgitation. - Left atrium: The atrium was at the upper limits of normal in  size. - Right atrium: Central venous pressure (est): 3 mm Hg. - Tricuspid valve: There was trivial regurgitation. - Pulmonary arteries: Systolic pressure could not be accurately  estimated. - Pericardium, extracardiac: There was no pericardial effusion.  Impressions:  - Mild LVH with LVEF 60-65%. Grade 1 diastolic dysfunction with  normal estimated LV filling pressure. Upper normal left atrial  chamber size. MAC with trivial mitral regurgitation. Functionally  bicuspid, calcified aortic valve with at least moderate aortic  stenosis. As mentioned in the prior study, measurements are  discordant with relatively low gradients but LVOT/AV VTI ratio  0.39. Unable to get good valve planimetry on this particular  study. There is mild aortic regurgitation.  07/2017 echo Study Conclusions  - Left ventricle: The cavity size was normal. Wall thickness was increased in a pattern of mild LVH. Systolic function was normal. The estimated ejection fraction was in the range of 55% to  60%. Wall motion was normal; there were no regional wall motion abnormalities. Doppler parameters are consistent with abnormal left ventricular relaxation (grade 1 diastolic dysfunction). - Aortic valve: Functionally bicuspid; moderately calcified leaflets. There was moderate to severe stenosis. There was mild regurgitation. Mean gradient (S): 11 mm Hg. Peak gradient (S): 20 mm Hg. VTI ratio of LVOT to aortic valve: 0.35. Valve area (  VTI): 0.88 cm^2. - Mitral valve: Mildly calcified annulus. There was mild regurgitation. There is an intermittently seen echodensity seen on the atrial side of the anterior mitral leaflet concerning for vegetation in patient with history of fever and stroke. - Left atrium: The atrium was mildly dilated. - Right atrium: Central venous pressure (est): 8 mm Hg. - Tricuspid valve: There was trivial regurgitation. - Pulmonary arteries: Systolic pressure could not be accurately estimated. - Pericardium, extracardiac: There was no pericardial effusion.  Impressions:  - There is an intermittently seen echodensity seen on the atrial side of the anterior mitral leaflet concerning for vegetation in patient with history of fever and stroke.   07/2017 TEE Study Conclusions  - Left ventricle: Systolic function was normal. The estimated ejection fraction was in the range of 55% to 60%. Wall motion was normal; there were no regional wall motion abnormalities. - Aortic valve: Small, freely mobile echodensity seen intermittently on the ventricular side of the right coronary aortic cusp suggestive of vegetation given clinical scenario. There was moderate stenosis. Valve area approximately 1.4 cm2 by planimetry. There was mild regurgitation. - Aorta: Moderate to severe diffuse atherosclerosis present. - Mitral valve: Very small, freely mobile echodensity on the atrial side of the posterior mitral leaflet suggestive of  vegetation given clinical scenario. The larger abnormality in region of anterior leaflet noted on the transthoracic study is not present - perhaps more reflective of artifact. Mildly calcified annulus. There was mild regurgitation. - Left atrium: The atrium was dilated. No evidence of thrombus in the atrial cavity or appendage. Emptying velocity was normal. - Right atrium: No evidence of thrombus in the atrial cavity or appendage. - Atrial septum: No significant defect or patent foramen ovale was identified by color Doppler or agitated saline injection. - Tricuspid valve: There was trivial regurgitation. - Pulmonic valve: There was mild regurgitation. - Pericardium, extracardiac: There was no pericardial effusion.  Impressions:  - Very small, freely mobile echodensity on the atrial side of the posterior mitral leaflet suggestive of vegetation given clinical scenario. The larger abnormality in region of anterior leaflet noted on the transthoracic study is not present - perhaps more reflective of artifact. There is also a small, freely mobile echodensity seen intermittently on the ventricular side of the right coronary aortic cusp suggestive of vegetation given clinical scenario. No significant defect or patent foramen ovale was identified by color Doppler or agitated saline injection   10/2017 echo Study Conclusions  - Left ventricle: The cavity size was normal. Wall thickness was   increased in a pattern of mild LVH. Systolic function was normal.   The estimated ejection fraction was in the range of 55% to 60%.   Wall motion was normal; there were no regional wall motion   abnormalities. Doppler parameters are consistent with abnormal   left ventricular relaxation (grade 1 diastolic dysfunction). - Aortic valve: Moderately calcified annulus. Probably trileaflet;   mildly calcified leaflets. Valve mobility was restricted. There   was moderate  stenosis. There was mild regurgitation. Mean   gradient (S): 12 mm Hg. VTI ratio of LVOT to aortic valve: 0.37.   Valve area (VTI): 1.27 cm^2. - Mitral valve: Mildly calcified annulus. Mildly thickened, mildly   calcified leaflets. There was mild regurgitation. - Right atrium: Central venous pressure (est): 3 mm Hg. - Atrial septum: No defect or patent foramen ovale was identified. - Tricuspid valve: There was trivial regurgitation. - Pulmonary arteries: PA peak pressure: 19 mm Hg (S). - Pericardium, extracardiac: There  was no pericardial effusion.  Impressions:  - No definitive valvular vegetations noted. Assessment and Plan   1. Aortic stenosis - mild to moderate by last echo criteria, continue to monitor.   2. CAD -recent 2 episodes of chest pain that have resolved, not overally consistent with cardiac chest pain - continue to monitor. HIgher thresthold for ischemic testing due to his renal dysfunction, would certaintly consider if symptoms reoccur and progress.   3. HL - intolerant to statins, continue dietary modification. LDL has been reasonable, does not require alternative therapies.  - request pcp labs.   4. HTN -at goal in clinic, home numbers elevated. Recommended he check his bp later in the day after his meds have had time to take effect   5. Carotid stenosis -mild to moderate,will repeat carotid US next year   6. Endocarditis - has completed abx treatment, repeat echo without evidence of vegetations. Asymptomatic.     F/u 6 montghs  [name], M.D.

## 2018-03-13 NOTE — Patient Instructions (Signed)

## 2018-03-24 ENCOUNTER — Encounter: Payer: Self-pay | Admitting: Gastroenterology

## 2018-04-06 DIAGNOSIS — S72099A Other fracture of head and neck of unspecified femur, initial encounter for closed fracture: Secondary | ICD-10-CM | POA: Diagnosis not present

## 2018-04-06 DIAGNOSIS — I129 Hypertensive chronic kidney disease with stage 1 through stage 4 chronic kidney disease, or unspecified chronic kidney disease: Secondary | ICD-10-CM | POA: Diagnosis not present

## 2018-04-06 DIAGNOSIS — R339 Retention of urine, unspecified: Secondary | ICD-10-CM | POA: Diagnosis not present

## 2018-04-06 DIAGNOSIS — E1151 Type 2 diabetes mellitus with diabetic peripheral angiopathy without gangrene: Secondary | ICD-10-CM | POA: Diagnosis not present

## 2018-04-06 DIAGNOSIS — E1165 Type 2 diabetes mellitus with hyperglycemia: Secondary | ICD-10-CM | POA: Diagnosis not present

## 2018-04-06 DIAGNOSIS — I6529 Occlusion and stenosis of unspecified carotid artery: Secondary | ICD-10-CM | POA: Diagnosis not present

## 2018-04-06 DIAGNOSIS — R944 Abnormal results of kidney function studies: Secondary | ICD-10-CM | POA: Diagnosis not present

## 2018-04-06 DIAGNOSIS — R4189 Other symptoms and signs involving cognitive functions and awareness: Secondary | ICD-10-CM | POA: Diagnosis not present

## 2018-04-06 DIAGNOSIS — S72012D Unspecified intracapsular fracture of left femur, subsequent encounter for closed fracture with routine healing: Secondary | ICD-10-CM | POA: Diagnosis not present

## 2018-04-06 DIAGNOSIS — Z6830 Body mass index (BMI) 30.0-30.9, adult: Secondary | ICD-10-CM | POA: Diagnosis not present

## 2018-04-06 DIAGNOSIS — Z7984 Long term (current) use of oral hypoglycemic drugs: Secondary | ICD-10-CM | POA: Diagnosis not present

## 2018-04-06 DIAGNOSIS — I350 Nonrheumatic aortic (valve) stenosis: Secondary | ICD-10-CM | POA: Diagnosis not present

## 2018-04-10 DIAGNOSIS — I10 Essential (primary) hypertension: Secondary | ICD-10-CM | POA: Diagnosis not present

## 2018-04-10 DIAGNOSIS — N183 Chronic kidney disease, stage 3 (moderate): Secondary | ICD-10-CM | POA: Diagnosis not present

## 2018-04-10 DIAGNOSIS — F39 Unspecified mood [affective] disorder: Secondary | ICD-10-CM | POA: Diagnosis not present

## 2018-04-10 DIAGNOSIS — I6529 Occlusion and stenosis of unspecified carotid artery: Secondary | ICD-10-CM | POA: Diagnosis not present

## 2018-04-10 DIAGNOSIS — I129 Hypertensive chronic kidney disease with stage 1 through stage 4 chronic kidney disease, or unspecified chronic kidney disease: Secondary | ICD-10-CM | POA: Diagnosis not present

## 2018-04-10 DIAGNOSIS — E1122 Type 2 diabetes mellitus with diabetic chronic kidney disease: Secondary | ICD-10-CM | POA: Diagnosis not present

## 2018-04-10 DIAGNOSIS — H409 Unspecified glaucoma: Secondary | ICD-10-CM | POA: Diagnosis not present

## 2018-04-10 DIAGNOSIS — R809 Proteinuria, unspecified: Secondary | ICD-10-CM | POA: Diagnosis not present

## 2018-04-10 DIAGNOSIS — J449 Chronic obstructive pulmonary disease, unspecified: Secondary | ICD-10-CM | POA: Diagnosis not present

## 2018-04-10 DIAGNOSIS — N401 Enlarged prostate with lower urinary tract symptoms: Secondary | ICD-10-CM | POA: Diagnosis not present

## 2018-04-10 DIAGNOSIS — E782 Mixed hyperlipidemia: Secondary | ICD-10-CM | POA: Diagnosis not present

## 2018-04-10 DIAGNOSIS — F5101 Primary insomnia: Secondary | ICD-10-CM | POA: Diagnosis not present

## 2018-04-11 DIAGNOSIS — I10 Essential (primary) hypertension: Secondary | ICD-10-CM | POA: Diagnosis not present

## 2018-04-11 DIAGNOSIS — E1165 Type 2 diabetes mellitus with hyperglycemia: Secondary | ICD-10-CM | POA: Diagnosis not present

## 2018-04-11 DIAGNOSIS — J441 Chronic obstructive pulmonary disease with (acute) exacerbation: Secondary | ICD-10-CM | POA: Diagnosis not present

## 2018-04-11 DIAGNOSIS — E782 Mixed hyperlipidemia: Secondary | ICD-10-CM | POA: Diagnosis not present

## 2018-04-24 DIAGNOSIS — B351 Tinea unguium: Secondary | ICD-10-CM | POA: Diagnosis not present

## 2018-04-24 DIAGNOSIS — E1151 Type 2 diabetes mellitus with diabetic peripheral angiopathy without gangrene: Secondary | ICD-10-CM | POA: Diagnosis not present

## 2018-05-05 DIAGNOSIS — Z85828 Personal history of other malignant neoplasm of skin: Secondary | ICD-10-CM | POA: Diagnosis not present

## 2018-05-05 DIAGNOSIS — L2082 Flexural eczema: Secondary | ICD-10-CM | POA: Diagnosis not present

## 2018-05-05 DIAGNOSIS — L820 Inflamed seborrheic keratosis: Secondary | ICD-10-CM | POA: Diagnosis not present

## 2018-05-05 DIAGNOSIS — L814 Other melanin hyperpigmentation: Secondary | ICD-10-CM | POA: Diagnosis not present

## 2018-05-05 DIAGNOSIS — L570 Actinic keratosis: Secondary | ICD-10-CM | POA: Diagnosis not present

## 2018-05-05 DIAGNOSIS — C4442 Squamous cell carcinoma of skin of scalp and neck: Secondary | ICD-10-CM | POA: Diagnosis not present

## 2018-05-05 DIAGNOSIS — L821 Other seborrheic keratosis: Secondary | ICD-10-CM | POA: Diagnosis not present

## 2018-05-05 DIAGNOSIS — D225 Melanocytic nevi of trunk: Secondary | ICD-10-CM | POA: Diagnosis not present

## 2018-05-08 DIAGNOSIS — Z961 Presence of intraocular lens: Secondary | ICD-10-CM | POA: Diagnosis not present

## 2018-05-08 DIAGNOSIS — H401123 Primary open-angle glaucoma, left eye, severe stage: Secondary | ICD-10-CM | POA: Diagnosis not present

## 2018-05-08 DIAGNOSIS — H401112 Primary open-angle glaucoma, right eye, moderate stage: Secondary | ICD-10-CM | POA: Diagnosis not present

## 2018-06-05 DIAGNOSIS — C4442 Squamous cell carcinoma of skin of scalp and neck: Secondary | ICD-10-CM | POA: Diagnosis not present

## 2018-06-15 DIAGNOSIS — I10 Essential (primary) hypertension: Secondary | ICD-10-CM | POA: Diagnosis not present

## 2018-06-15 DIAGNOSIS — E782 Mixed hyperlipidemia: Secondary | ICD-10-CM | POA: Diagnosis not present

## 2018-07-14 DIAGNOSIS — E1165 Type 2 diabetes mellitus with hyperglycemia: Secondary | ICD-10-CM | POA: Diagnosis not present

## 2018-07-14 DIAGNOSIS — D509 Iron deficiency anemia, unspecified: Secondary | ICD-10-CM | POA: Diagnosis not present

## 2018-07-14 DIAGNOSIS — N183 Chronic kidney disease, stage 3 (moderate): Secondary | ICD-10-CM | POA: Diagnosis not present

## 2018-07-14 DIAGNOSIS — E1151 Type 2 diabetes mellitus with diabetic peripheral angiopathy without gangrene: Secondary | ICD-10-CM | POA: Diagnosis not present

## 2018-07-14 DIAGNOSIS — I10 Essential (primary) hypertension: Secondary | ICD-10-CM | POA: Diagnosis not present

## 2018-07-14 DIAGNOSIS — E1122 Type 2 diabetes mellitus with diabetic chronic kidney disease: Secondary | ICD-10-CM | POA: Diagnosis not present

## 2018-07-14 DIAGNOSIS — E782 Mixed hyperlipidemia: Secondary | ICD-10-CM | POA: Diagnosis not present

## 2018-07-17 DIAGNOSIS — N401 Enlarged prostate with lower urinary tract symptoms: Secondary | ICD-10-CM | POA: Diagnosis not present

## 2018-07-17 DIAGNOSIS — I2510 Atherosclerotic heart disease of native coronary artery without angina pectoris: Secondary | ICD-10-CM | POA: Diagnosis not present

## 2018-07-17 DIAGNOSIS — N183 Chronic kidney disease, stage 3 (moderate): Secondary | ICD-10-CM | POA: Diagnosis not present

## 2018-07-17 DIAGNOSIS — E782 Mixed hyperlipidemia: Secondary | ICD-10-CM | POA: Diagnosis not present

## 2018-07-17 DIAGNOSIS — Z85828 Personal history of other malignant neoplasm of skin: Secondary | ICD-10-CM | POA: Diagnosis not present

## 2018-07-17 DIAGNOSIS — L570 Actinic keratosis: Secondary | ICD-10-CM | POA: Diagnosis not present

## 2018-07-17 DIAGNOSIS — E1122 Type 2 diabetes mellitus with diabetic chronic kidney disease: Secondary | ICD-10-CM | POA: Diagnosis not present

## 2018-07-17 DIAGNOSIS — F5101 Primary insomnia: Secondary | ICD-10-CM | POA: Diagnosis not present

## 2018-07-17 DIAGNOSIS — I10 Essential (primary) hypertension: Secondary | ICD-10-CM | POA: Diagnosis not present

## 2018-07-17 DIAGNOSIS — M19049 Primary osteoarthritis, unspecified hand: Secondary | ICD-10-CM | POA: Diagnosis not present

## 2018-07-17 DIAGNOSIS — R3911 Hesitancy of micturition: Secondary | ICD-10-CM | POA: Diagnosis not present

## 2018-07-17 DIAGNOSIS — J449 Chronic obstructive pulmonary disease, unspecified: Secondary | ICD-10-CM | POA: Diagnosis not present

## 2018-07-17 DIAGNOSIS — L905 Scar conditions and fibrosis of skin: Secondary | ICD-10-CM | POA: Diagnosis not present

## 2018-07-17 DIAGNOSIS — H409 Unspecified glaucoma: Secondary | ICD-10-CM | POA: Diagnosis not present

## 2018-07-17 DIAGNOSIS — G4700 Insomnia, unspecified: Secondary | ICD-10-CM | POA: Diagnosis not present

## 2018-07-24 DIAGNOSIS — B351 Tinea unguium: Secondary | ICD-10-CM | POA: Diagnosis not present

## 2018-07-24 DIAGNOSIS — E1151 Type 2 diabetes mellitus with diabetic peripheral angiopathy without gangrene: Secondary | ICD-10-CM | POA: Diagnosis not present

## 2018-08-07 DIAGNOSIS — S72099A Other fracture of head and neck of unspecified femur, initial encounter for closed fracture: Secondary | ICD-10-CM | POA: Diagnosis not present

## 2018-08-07 DIAGNOSIS — E1151 Type 2 diabetes mellitus with diabetic peripheral angiopathy without gangrene: Secondary | ICD-10-CM | POA: Diagnosis not present

## 2018-08-07 DIAGNOSIS — N400 Enlarged prostate without lower urinary tract symptoms: Secondary | ICD-10-CM | POA: Diagnosis not present

## 2018-08-07 DIAGNOSIS — R809 Proteinuria, unspecified: Secondary | ICD-10-CM | POA: Diagnosis not present

## 2018-08-07 DIAGNOSIS — S72012D Unspecified intracapsular fracture of left femur, subsequent encounter for closed fracture with routine healing: Secondary | ICD-10-CM | POA: Diagnosis not present

## 2018-08-07 DIAGNOSIS — E1165 Type 2 diabetes mellitus with hyperglycemia: Secondary | ICD-10-CM | POA: Diagnosis not present

## 2018-08-07 DIAGNOSIS — I6529 Occlusion and stenosis of unspecified carotid artery: Secondary | ICD-10-CM | POA: Diagnosis not present

## 2018-08-07 DIAGNOSIS — Z6830 Body mass index (BMI) 30.0-30.9, adult: Secondary | ICD-10-CM | POA: Diagnosis not present

## 2018-08-07 DIAGNOSIS — R4189 Other symptoms and signs involving cognitive functions and awareness: Secondary | ICD-10-CM | POA: Diagnosis not present

## 2018-08-07 DIAGNOSIS — I129 Hypertensive chronic kidney disease with stage 1 through stage 4 chronic kidney disease, or unspecified chronic kidney disease: Secondary | ICD-10-CM | POA: Diagnosis not present

## 2018-08-07 DIAGNOSIS — N183 Chronic kidney disease, stage 3 (moderate): Secondary | ICD-10-CM | POA: Diagnosis not present

## 2018-08-07 DIAGNOSIS — I350 Nonrheumatic aortic (valve) stenosis: Secondary | ICD-10-CM | POA: Diagnosis not present

## 2018-08-07 DIAGNOSIS — R944 Abnormal results of kidney function studies: Secondary | ICD-10-CM | POA: Diagnosis not present

## 2018-08-07 DIAGNOSIS — R600 Localized edema: Secondary | ICD-10-CM | POA: Diagnosis not present

## 2018-08-07 DIAGNOSIS — I10 Essential (primary) hypertension: Secondary | ICD-10-CM | POA: Diagnosis not present

## 2018-08-07 DIAGNOSIS — R339 Retention of urine, unspecified: Secondary | ICD-10-CM | POA: Diagnosis not present

## 2018-08-07 DIAGNOSIS — R3911 Hesitancy of micturition: Secondary | ICD-10-CM | POA: Diagnosis not present

## 2018-09-19 ENCOUNTER — Telehealth: Payer: Self-pay | Admitting: *Deleted

## 2018-09-19 NOTE — Telephone Encounter (Signed)
Pt contacted per Dr Harl Bowie. History reviewed. No symptoms to suggest any unstable cardiac conditions. Based on discussion, with current pandemic situation, we have postponed 09/22/18 appointment until June 202. If symptoms change, pt has been instructed to contact our office. Pt aware telehealth options

## 2018-09-22 ENCOUNTER — Ambulatory Visit: Payer: PPO | Admitting: Cardiology

## 2018-09-26 DIAGNOSIS — G500 Trigeminal neuralgia: Secondary | ICD-10-CM | POA: Diagnosis not present

## 2018-10-30 DIAGNOSIS — B351 Tinea unguium: Secondary | ICD-10-CM | POA: Diagnosis not present

## 2018-10-30 DIAGNOSIS — E1151 Type 2 diabetes mellitus with diabetic peripheral angiopathy without gangrene: Secondary | ICD-10-CM | POA: Diagnosis not present

## 2018-10-31 DIAGNOSIS — Z0000 Encounter for general adult medical examination without abnormal findings: Secondary | ICD-10-CM | POA: Diagnosis not present

## 2018-11-06 ENCOUNTER — Observation Stay (HOSPITAL_COMMUNITY)
Admission: EM | Admit: 2018-11-06 | Discharge: 2018-11-07 | Disposition: A | Discharge status: Home / Self Care | Payer: PPO | Attending: Internal Medicine | Admitting: Internal Medicine

## 2018-11-06 ENCOUNTER — Telehealth (INDEPENDENT_AMBULATORY_CARE_PROVIDER_SITE_OTHER): Payer: PPO | Admitting: Cardiology

## 2018-11-06 ENCOUNTER — Telehealth: Payer: Self-pay | Admitting: Cardiology

## 2018-11-06 ENCOUNTER — Encounter (HOSPITAL_COMMUNITY): Payer: Self-pay | Admitting: Emergency Medicine

## 2018-11-06 ENCOUNTER — Encounter: Payer: Self-pay | Admitting: Cardiology

## 2018-11-06 ENCOUNTER — Observation Stay (HOSPITAL_COMMUNITY): Payer: PPO

## 2018-11-06 ENCOUNTER — Emergency Department (HOSPITAL_COMMUNITY): Payer: PPO

## 2018-11-06 ENCOUNTER — Other Ambulatory Visit: Payer: Self-pay

## 2018-11-06 VITALS — BP 149/77 | HR 76 | Ht 66.5 in

## 2018-11-06 DIAGNOSIS — N183 Chronic kidney disease, stage 3 unspecified: Secondary | ICD-10-CM | POA: Diagnosis present

## 2018-11-06 DIAGNOSIS — Z87891 Personal history of nicotine dependence: Secondary | ICD-10-CM | POA: Insufficient documentation

## 2018-11-06 DIAGNOSIS — Z20828 Contact with and (suspected) exposure to other viral communicable diseases: Secondary | ICD-10-CM | POA: Insufficient documentation

## 2018-11-06 DIAGNOSIS — I129 Hypertensive chronic kidney disease with stage 1 through stage 4 chronic kidney disease, or unspecified chronic kidney disease: Secondary | ICD-10-CM | POA: Insufficient documentation

## 2018-11-06 DIAGNOSIS — Z8679 Personal history of other diseases of the circulatory system: Secondary | ICD-10-CM

## 2018-11-06 DIAGNOSIS — Z8673 Personal history of transient ischemic attack (TIA), and cerebral infarction without residual deficits: Secondary | ICD-10-CM

## 2018-11-06 DIAGNOSIS — Z79899 Other long term (current) drug therapy: Secondary | ICD-10-CM | POA: Insufficient documentation

## 2018-11-06 DIAGNOSIS — R079 Chest pain, unspecified: Principal | ICD-10-CM | POA: Insufficient documentation

## 2018-11-06 DIAGNOSIS — I779 Disorder of arteries and arterioles, unspecified: Secondary | ICD-10-CM | POA: Diagnosis present

## 2018-11-06 DIAGNOSIS — E1122 Type 2 diabetes mellitus with diabetic chronic kidney disease: Secondary | ICD-10-CM | POA: Insufficient documentation

## 2018-11-06 DIAGNOSIS — J9811 Atelectasis: Secondary | ICD-10-CM | POA: Diagnosis not present

## 2018-11-06 DIAGNOSIS — E118 Type 2 diabetes mellitus with unspecified complications: Secondary | ICD-10-CM | POA: Diagnosis present

## 2018-11-06 DIAGNOSIS — R0789 Other chest pain: Secondary | ICD-10-CM

## 2018-11-06 DIAGNOSIS — Z7982 Long term (current) use of aspirin: Secondary | ICD-10-CM | POA: Insufficient documentation

## 2018-11-06 DIAGNOSIS — I10 Essential (primary) hypertension: Secondary | ICD-10-CM | POA: Diagnosis present

## 2018-11-06 DIAGNOSIS — I2510 Atherosclerotic heart disease of native coronary artery without angina pectoris: Secondary | ICD-10-CM | POA: Diagnosis present

## 2018-11-06 DIAGNOSIS — Z8719 Personal history of other diseases of the digestive system: Secondary | ICD-10-CM

## 2018-11-06 DIAGNOSIS — Z7984 Long term (current) use of oral hypoglycemic drugs: Secondary | ICD-10-CM | POA: Diagnosis not present

## 2018-11-06 DIAGNOSIS — R0602 Shortness of breath: Secondary | ICD-10-CM | POA: Diagnosis not present

## 2018-11-06 DIAGNOSIS — I209 Angina pectoris, unspecified: Secondary | ICD-10-CM | POA: Diagnosis present

## 2018-11-06 LAB — BASIC METABOLIC PANEL
Anion gap: 7 (ref 5–15)
BUN: 22 mg/dL (ref 8–23)
CO2: 27 mmol/L (ref 22–32)
Calcium: 9.1 mg/dL (ref 8.9–10.3)
Chloride: 107 mmol/L (ref 98–111)
Creatinine, Ser: 1.89 mg/dL — ABNORMAL HIGH (ref 0.61–1.24)
GFR calc Af Amer: 36 mL/min — ABNORMAL LOW (ref >60)
GFR calc non Af Amer: 31 mL/min — ABNORMAL LOW (ref >60)
Glucose, Bld: 170 mg/dL — ABNORMAL HIGH (ref 70–99)
Potassium: 3.7 mmol/L (ref 3.5–5.1)
Sodium: 141 mmol/L (ref 135–145)

## 2018-11-06 LAB — CBC WITH DIFFERENTIAL/PLATELET
Abs Immature Granulocytes: 0.01 10*3/uL (ref 0.00–0.07)
Basophils Absolute: 0 10*3/uL (ref 0.0–0.1)
Basophils Relative: 1 %
Eosinophils Absolute: 0.3 10*3/uL (ref 0.0–0.5)
Eosinophils Relative: 4 %
HCT: 48 % (ref 39.0–52.0)
Hemoglobin: 15.7 g/dL (ref 13.0–17.0)
Immature Granulocytes: 0 %
Lymphocytes Relative: 25 %
Lymphs Abs: 1.6 10*3/uL (ref 0.7–4.0)
MCH: 29.7 pg (ref 26.0–34.0)
MCHC: 32.7 g/dL (ref 30.0–36.0)
MCV: 90.9 fL (ref 80.0–100.0)
Monocytes Absolute: 0.8 10*3/uL (ref 0.1–1.0)
Monocytes Relative: 13 %
Neutro Abs: 3.5 10*3/uL (ref 1.7–7.7)
Neutrophils Relative %: 57 %
Platelets: 169 10*3/uL (ref 150–400)
RBC: 5.28 MIL/uL (ref 4.22–5.81)
RDW: 15.1 % (ref 11.5–15.5)
WBC: 6.1 10*3/uL (ref 4.0–10.5)
nRBC: 0 % (ref 0.0–0.2)

## 2018-11-06 LAB — TROPONIN I
Troponin I: 0.03 ng/mL (ref <0.03)
Troponin I: <0.03 ng/mL (ref <0.03)
Troponin I: 0.03 ng/mL (ref <0.03)

## 2018-11-06 LAB — SARS CORONAVIRUS 2 BY RT PCR (HOSPITAL ORDER, PERFORMED IN ~~LOC~~ HOSPITAL LAB): SARS Coronavirus 2: NEGATIVE

## 2018-11-06 LAB — GLUCOSE, CAPILLARY: Glucose-Capillary: 120 mg/dL — ABNORMAL HIGH (ref 70–99)

## 2018-11-06 LAB — D-DIMER, QUANTITATIVE: D-Dimer, Quant: 1.71 ug/mL-FEU — ABNORMAL HIGH (ref 0.00–0.50)

## 2018-11-06 MED ORDER — ONDANSETRON HCL 4 MG PO TABS: 4.0000 mg | ORAL_TABLET | Freq: Four times a day (QID) | ORAL | Status: DC | PRN

## 2018-11-06 MED ORDER — UMECLIDINIUM BROMIDE 62.5 MCG/INH IN AEPB
1.0000 | INHALATION_SPRAY | Freq: Every day | RESPIRATORY_TRACT | Status: DC
  Administered 2018-11-07: 1 via RESPIRATORY_TRACT
  Filled 2018-11-06: qty 7

## 2018-11-06 MED ORDER — ASPIRIN 81 MG PO CHEW
81.0000 mg | CHEWABLE_TABLET | Freq: Every day | ORAL | Status: DC
  Administered 2018-11-07: 09:00:00 81 mg via ORAL
  Filled 2018-11-06: qty 1

## 2018-11-06 MED ORDER — NITROGLYCERIN 0.4 MG SL SUBL: 0.4000 mg | SUBLINGUAL_TABLET | SUBLINGUAL | Status: DC | PRN

## 2018-11-06 MED ORDER — ASPIRIN 325 MG PO TABS
325.0000 mg | ORAL_TABLET | Freq: Once | ORAL | Status: AC
  Administered 2018-11-06: 325 mg via ORAL
  Filled 2018-11-06: qty 1

## 2018-11-06 MED ORDER — PANTOPRAZOLE SODIUM 40 MG PO TBEC
40.0000 mg | DELAYED_RELEASE_TABLET | Freq: Two times a day (BID) | ORAL | Status: DC
  Administered 2018-11-06 – 2018-11-07 (×2): 40 mg via ORAL
  Filled 2018-11-06 (×2): qty 1

## 2018-11-06 MED ORDER — AMLODIPINE BESYLATE 5 MG PO TABS
5.0000 mg | ORAL_TABLET | Freq: Every day | ORAL | Status: DC
  Administered 2018-11-07: 09:00:00 5 mg via ORAL
  Filled 2018-11-06: qty 1

## 2018-11-06 MED ORDER — FLUTICASONE FUROATE-VILANTEROL 100-25 MCG/INH IN AEPB
1.0000 | INHALATION_SPRAY | Freq: Every day | RESPIRATORY_TRACT | Status: DC
  Administered 2018-11-07: 1 via RESPIRATORY_TRACT
  Filled 2018-11-06: qty 28

## 2018-11-06 MED ORDER — OXYBUTYNIN CHLORIDE 5 MG PO TABS
5.0000 mg | ORAL_TABLET | Freq: Every day | ORAL | Status: DC
  Administered 2018-11-07: 09:00:00 5 mg via ORAL
  Filled 2018-11-06: qty 1

## 2018-11-06 MED ORDER — ACETAMINOPHEN 650 MG RE SUPP: 650.0000 mg | Freq: Four times a day (QID) | RECTAL | Status: DC | PRN

## 2018-11-06 MED ORDER — POTASSIUM CHLORIDE CRYS ER 20 MEQ PO TBCR
10.0000 meq | EXTENDED_RELEASE_TABLET | Freq: Every day | ORAL | Status: DC
  Administered 2018-11-07: 10 meq via ORAL
  Filled 2018-11-06 (×3): qty 1

## 2018-11-06 MED ORDER — CARVEDILOL 12.5 MG PO TABS
12.5000 mg | ORAL_TABLET | Freq: Two times a day (BID) | ORAL | Status: DC
  Administered 2018-11-06 – 2018-11-07 (×3): 12.5 mg via ORAL
  Filled 2018-11-06 (×3): qty 1

## 2018-11-06 MED ORDER — FLUTICASONE-UMECLIDIN-VILANT 100-62.5-25 MCG/INH IN AEPB: 1.0000 | INHALATION_SPRAY | Freq: Every day | RESPIRATORY_TRACT | Status: DC

## 2018-11-06 MED ORDER — IPRATROPIUM-ALBUTEROL 0.5-2.5 (3) MG/3ML IN SOLN: 3.0000 mL | RESPIRATORY_TRACT | Status: DC | PRN

## 2018-11-06 MED ORDER — LORATADINE 10 MG PO TABS
10.0000 mg | ORAL_TABLET | Freq: Every day | ORAL | Status: DC
  Administered 2018-11-07: 10:00:00 10 mg via ORAL
  Filled 2018-11-06: qty 1

## 2018-11-06 MED ORDER — DORZOLAMIDE HCL 2 % OP SOLN
1.0000 [drp] | Freq: Two times a day (BID) | OPHTHALMIC | Status: DC
  Administered 2018-11-06 – 2018-11-07 (×2): 1 [drp] via OPHTHALMIC
  Filled 2018-11-06: qty 10

## 2018-11-06 MED ORDER — ENOXAPARIN SODIUM 30 MG/0.3ML ~~LOC~~ SOLN
30.0000 mg | SUBCUTANEOUS | Status: DC
  Administered 2018-11-06: 30 mg via SUBCUTANEOUS
  Filled 2018-11-06: qty 0.3

## 2018-11-06 MED ORDER — TECHNETIUM TO 99M ALBUMIN AGGREGATED
1.5000 | Freq: Once | INTRAVENOUS | Status: AC | PRN
  Administered 2018-11-06: 1.5 via INTRAVENOUS

## 2018-11-06 MED ORDER — MORPHINE SULFATE (PF) 2 MG/ML IV SOLN: 2.0000 mg | INTRAVENOUS | Status: DC | PRN

## 2018-11-06 MED ORDER — POLYETHYLENE GLYCOL 3350 17 G PO PACK
17.0000 g | PACK | Freq: Every day | ORAL | Status: DC
  Filled 2018-11-06: qty 1

## 2018-11-06 MED ORDER — INSULIN ASPART 100 UNIT/ML ~~LOC~~ SOLN
0.0000 [IU] | SUBCUTANEOUS | Status: DC
  Administered 2018-11-07 (×2): 1 [IU] via SUBCUTANEOUS
  Administered 2018-11-07: 2 [IU] via SUBCUTANEOUS

## 2018-11-06 MED ORDER — BETAXOLOL HCL 0.5 % OP SOLN
1.0000 [drp] | Freq: Every day | OPHTHALMIC | Status: DC
  Administered 2018-11-07: 10:00:00 1 [drp] via OPHTHALMIC
  Filled 2018-11-06: qty 5

## 2018-11-06 MED ORDER — TORSEMIDE 20 MG PO TABS
10.0000 mg | ORAL_TABLET | Freq: Every day | ORAL | Status: DC
  Administered 2018-11-07: 10 mg via ORAL
  Filled 2018-11-06: qty 1

## 2018-11-06 MED ORDER — ONDANSETRON HCL 4 MG/2ML IJ SOLN: 4.0000 mg | Freq: Four times a day (QID) | INTRAMUSCULAR | Status: DC | PRN

## 2018-11-06 MED ORDER — ACETAMINOPHEN 325 MG PO TABS: 650.0000 mg | ORAL_TABLET | Freq: Four times a day (QID) | ORAL | Status: DC | PRN

## 2018-11-06 MED ORDER — TAMSULOSIN HCL 0.4 MG PO CAPS
0.4000 mg | ORAL_CAPSULE | Freq: Every day | ORAL | Status: DC
  Administered 2018-11-07: 0.4 mg via ORAL
  Filled 2018-11-06: qty 1

## 2018-11-06 MED ORDER — NETARSUDIL DIMESYLATE 0.02 % OP SOLN
1.0000 [drp] | Freq: Every day | OPHTHALMIC | Status: DC
  Filled 2018-11-06: qty 2.5

## 2018-11-06 NOTE — ED Provider Notes (Addendum)
[facility] EMERGENCY DEPARTMENT Provider Note   CSN: [id number] Arrival date & time: 11/06/18  1229    History   Chief Complaint Chief Complaint  Patient presents with  . Chest Pain    HPI Anthony Murray is a 85 y.o. male with history of CAD, CKD, diabetes, hypertension, hyperlipidemia, aortic valve stenosis presenting today for chest pain.  Patient reports exertional chest pain that began yesterday multiple episodes with each episode but longer in duration and more intense, describes central chest pressure rating down left arm alleviated with rest.  Pain lasted approximately 15 minutes in duration.  Pain returns with any exertion including ambulation within one room.  Chest pain-free on my evaluation.  Patient was evaluated via telemedicine by cardiologist today who recommended ED evaluation.     HPI  Past Medical History:  Diagnosis Date  . Aortic valve stenosis   . ASCVD (arteriosclerotic cardiovascular disease)    50% ostial left left anterior desending 60% mid circumflex and normal ejection fractioon in 2006 exertional dyspnea;history of chest discomfort  . Cerebrovascular disease    minimal carotid bruits  . CKD (chronic kidney disease)   . Diabetes mellitus type 2, controlled ([place])    no insulin  . Hyperlipidemia    statin intolerant  . Hypertension   . Tobacco abuse, in remission    30 pack years stopped in 1971    Patient Active Problem List   Diagnosis Date Noted  . Chest pain 11/06/2018  . Lower GI bleed 09/30/2017  . Colostomy in place [facility]) 09/30/2017  . Endocarditis of mitral valve 08/25/2017  . Abnormal carotid duplex scan   . Aortic valve endocarditis   . CVA (cerebrovascular accident) ([place]) 07/27/2017  . Glaucoma 07/27/2017  . Closed displaced subtrochanteric fracture of left femur ([place]) 02/03/2017  . Aortic stenosis 10/19/2012  . CKD (chronic kidney disease), stage III ([place]) 10/10/2011  . Peripheral vascular disease ([place]) 10/10/2011  .  Tobacco abuse, in remission   . Type 2 diabetes mellitus with complications ([place])   . Arteriosclerotic cardiovascular disease (ASCVD) 08/28/2009  . CEREBROVASCULAR DISEASE 08/28/2009  . HLD (hyperlipidemia) 12/09/2008  . Essential hypertension 12/09/2008    Past Surgical History:  Procedure Laterality Date  . APPENDECTOMY    . CARPAL TUNNEL RELEASE     surgery left 05/05/2001  . COLONOSCOPY N/A 10/01/2017   Procedure: COLONOSCOPY;  Surgeon: Jackquline Denmark, MD;  Location: [facility] ENDOSCOPY;  Service: Endoscopy;  Laterality: N/A;  . KNEE ARTHROSCOPY     bilaterial; unknown associated surgical procedure  . SHOULDER SURGERY     Right x2; third procedure on left  . TEE WITHOUT CARDIOVERSION N/A 07/29/2017   Procedure: TRANSESOPHAGEAL ECHOCARDIOGRAM (TEE) WITH PROPOFOL;  Surgeon: Satira Sark, MD;  Location: [facility] ENDO SUITE;  Service: Cardiovascular;  Laterality: N/A;  . TONSILLECTOMY    . TRANSURETHRAL INCISION OF PROSTATE     resection of the prostate        Home Medications    Prior to Admission medications   Medication Sig Start Date End Date Taking? Authorizing Provider  acetaminophen (TYLENOL) 325 MG tablet Take 325-650 mg by mouth every 6 (six) hours as needed (for pain).    Yes [provider]  albuterol (PROVENTIL HFA;VENTOLIN HFA) 108 (90 Base) MCG/ACT inhaler Inhale 2 puffs into the lungs every 4 (four) hours as needed for wheezing or shortness of breath.    Yes [provider]  amLODipine (NORVASC) 5 MG tablet Take 1 tablet (5 mg total)  by mouth daily. 02/16/18 11/06/18 Yes BranchAlphonse Guild, MD  aspirin 81 MG tablet Take 1 tablet (81 mg total) by mouth daily. 10/03/17  Yes Irene Pap N, DO  betaxolol (BETOPTIC-S) 0.5 % ophthalmic suspension Place 1 drop into both eyes every morning.  12/15/15  Yes [provider]  bimatoprost (LUMIGAN) 0.01 % SOLN Place 1 drop into both eyes every morning.    Yes [provider]  carvedilol (COREG) 12.5 MG  tablet Take 12.5 mg by mouth 2 (two) times daily with a meal.  09/28/12  Yes [provider]  Cranberry 500 MG CAPS Take 500 mg by mouth every evening.    Yes [provider]  dorzolamide (TRUSOPT) 2 % ophthalmic solution Place 1 drop into both eyes 2 (two) times daily.    Yes [provider]  fexofenadine (ALLEGRA) 180 MG tablet Take 180 mg by mouth daily.   Yes [provider]  Fluticasone-Umeclidin-Vilant (TRELEGY ELLIPTA) 100-62.5-25 MCG/INH AEPB Inhale 1 puff into the lungs daily.   Yes [provider]  glipiZIDE (GLUCOTROL XL) 5 MG 24 hr tablet Take 5 mg by mouth daily with breakfast.    Yes [provider]  ipratropium-albuterol (DUONEB) 0.5-2.5 (3) MG/3ML SOLN Take 3 mLs by nebulization every 4 (four) hours as needed. 10/03/17  Yes Hall, Carole N, DO  Netarsudil Dimesylate (RHOPRESSA) 0.02 % SOLN Place 1 drop into the left eye at bedtime.    Yes [provider]  oxybutynin (DITROPAN) 5 MG tablet Take 5 mg by mouth daily.    Yes [provider]  pantoprazole (PROTONIX) 40 MG tablet Take 1 tablet (40 mg total) by mouth 2 (two) times daily. 10/03/17  Yes Kayleen Memos, DO  pilocarpine (PILOCAR) 2 % ophthalmic solution Place 1 drop into the left eye 4 (four) times daily.    Yes [provider]  polyethylene glycol (MIRALAX / GLYCOLAX) packet Take 17 g by mouth daily. 10/04/17  Yes Irene Pap N, DO  potassium chloride (K-DUR) 10 MEQ tablet Take 10 mEq by mouth daily. ONLY WHILE TAKING LASIX   Yes [provider]  sitaGLIPtin (JANUVIA) 100 MG tablet Take 50 mg by mouth daily.   Yes [provider]  tamsulosin (FLOMAX) 0.4 MG CAPS capsule Take 0.4 mg by mouth daily.   Yes [provider]  torsemide (DEMADEX) 10 MG tablet Take 10 mg by mouth daily.   Yes [provider]    Family History Family History  Problem Relation Age of Onset  . Multiple sclerosis Mother   . Heart disease  Father   . Peripheral vascular disease Sister     Social History Social History   Tobacco Use  . Smoking status: Former Smoker    Packs/day: 1.00    Years: 40.00    Pack years: 40.00    Types: Cigarettes    Last attempt to quit: 10/29/1969    Years since quitting: 49.0  . Smokeless tobacco: Never Used  Substance Use Topics  . Alcohol use: No    Alcohol/week: 0.0 standard drinks  . Drug use: No     Allergies   Codeine; Ezetimibe-simvastatin; Naproxen; Niacin; Nsaids; and Pravastatin sodium   Review of Systems Review of Systems  Constitutional: Negative.  Negative for chills and fever.  Respiratory: Negative.  Negative for cough and shortness of breath.   Cardiovascular: Positive for chest pain and leg swelling (Reports is chronic without change). Negative for palpitations.  Gastrointestinal: Negative for abdominal pain,  diarrhea, nausea and vomiting.  Musculoskeletal: Negative.  Negative for back pain and neck pain.  All other systems reviewed and are negative.  Physical Exam Updated Vital Signs BP (!) 163/83   Pulse 71   Temp 98 F (36.7 C) (Oral)   Resp (!) 21   Ht 5' 6.5" (1.689 m)   Wt 90.7 kg   SpO2 94%   BMI 31.80 kg/m   Physical Exam Constitutional:      General: He is not in acute distress.    Appearance: Normal appearance. He is well-developed. He is not ill-appearing or diaphoretic.     Comments: Elderly frail-appearing  HENT:     Head: Normocephalic and atraumatic.     Right Ear: External ear normal.     Left Ear: External ear normal.     Nose: Nose normal.  Eyes:     General: Vision grossly intact. Gaze aligned appropriately.     Pupils: Pupils are equal, round, and reactive to light.  Neck:     Musculoskeletal: Normal range of motion.     Trachea: Trachea and phonation normal. No tracheal deviation.  Cardiovascular:     Rate and Rhythm: Normal rate and regular rhythm.     Pulses:          Radial pulses are 2+ on the right side and 2+ on  the left side.       Dorsalis pedis pulses are 2+ on the right side and 2+ on the left side.       Posterior tibial pulses are 2+ on the right side and 2+ on the left side.  Pulmonary:     Effort: Pulmonary effort is normal. No respiratory distress.     Breath sounds: Normal breath sounds.  Chest:     Chest wall: No deformity, tenderness or crepitus.  Abdominal:     General: There is no distension.     Palpations: Abdomen is soft.     Tenderness: There is no abdominal tenderness. There is no guarding or rebound.  Musculoskeletal: Normal range of motion.     Right lower leg: He exhibits no tenderness. Edema present.     Left lower leg: He exhibits no tenderness. Edema present.  Skin:    General: Skin is warm and dry.  Neurological:     Mental Status: He is alert.     GCS: GCS eye subscore is 4. GCS verbal subscore is 5. GCS motor subscore is 6.     Comments: Speech is clear and goal oriented, follows commands Major Cranial nerves without deficit, no facial droop Moves extremities without ataxia, coordination intact  Psychiatric:        Behavior: Behavior normal.    ED Treatments / Results  Labs (all labs ordered are listed, but only abnormal results are displayed) Labs Reviewed  BASIC METABOLIC PANEL - Abnormal; Notable for the following components:      Result Value   Glucose, Bld 170 (*)    Creatinine, Ser 1.89 (*)    GFR calc non Af Amer 31 (*)    GFR calc Af Amer 36 (*)    All other components within normal limits  TROPONIN I - Abnormal; Notable for the following components:   Troponin I 0.03 (*)    All other components within normal limits  D-DIMER, QUANTITATIVE (NOT AT [facility]) - Abnormal; Notable for the following components:   D-Dimer, Quant 1.71 (*)    All other components within normal limits  SARS CORONAVIRUS 2 (  HOSPITAL ORDER, [facility])  CBC WITH DIFFERENTIAL/PLATELET  TROPONIN I    EKG EKG Interpretation  Date/Time:  Monday Nov 06 2018 12:40:51 EDT Ventricular Rate:  72 PR Interval:    QRS Duration: 99 QT Interval:  413 QTC Calculation: 452 R Axis:   -29 Text Interpretation:  Sinus rhythm Borderline left axis deviation Abnormal R-wave progression, early transition Nonspecific T abnrm, anterolateral leads Baseline wander in lead(s) V3 No significant change since last tracing Confirmed by Fredia Sorrow [phone]) on 11/06/2018 12:48:42 PM   Radiology Dg Chest Port 1 View  Result Date: 11/06/2018 CLINICAL DATA:  Chest pain short of breath EXAM: PORTABLE CHEST 1 VIEW COMPARISON:  07/29/2017 FINDINGS: Hypoventilation with mild left lower lobe atelectasis. Negative for heart failure or pneumonia. No pleural effusion. IMPRESSION: Mild left lower lobe atelectasis. Electronically Signed   By: Franchot Gallo M.D.   On: 11/06/2018 13:08    Procedures .Critical Care Performed by: Deliah Boston, PA-C Authorized by: Deliah Boston, PA-C   Critical care provider statement:    Critical care time (minutes):  35   Critical care was necessary to treat or prevent imminent or life-threatening deterioration of the following conditions:  Cardiac failure (+ Troponin, suspected ACS)   Critical care was time spent personally by me on the following activities:  Ordering and performing treatments and interventions, ordering and review of laboratory studies, ordering and review of radiographic studies, pulse oximetry, re-evaluation of patient's condition, review of old charts, obtaining history from patient or surrogate, examination of patient, evaluation of patient's response to treatment, discussions with consultants and development of treatment plan with patient or surrogate   (including critical care time)  Medications Ordered in ED Medications - No data to display   Initial Impression / Assessment and Plan / ED Course  I have reviewed the triage vital signs and the nursing notes.  Pertinent labs & imaging results that  were available during my care of the patient were reviewed by me and considered in my medical decision making (see chart for details).  Clinical Course as of Nov 06 1654  Mon Nov 06, 2018  1454 Discussed with Cardiology admit to hospitalist at [facility], NPO after midnight.   [BM]  1541 Discussed with hospitalist service.   [BM]  1644 Discussed d-dimer results with Dr. Denton Brick, advised to cancel CT angiogram, hospitalist to order a VQ scan   [BM]    Clinical Course User Index [BM] Nuala Alpha A, PA-C   CBC within normal limits BMP with elevated creatinine appears baseline Troponin 0 0.03 COVID-19 negative Chest x-ray:  IMPRESSION:  Mild left lower lobe atelectasis.   EKG: Sinus rhythm Borderline left axis deviation Abnormal R-wave progression, early transition Nonspecific T abnrm, anterolateral leads Baseline wander in lead(s) V3 No significant change since last tracing Confirmed by Fredia Sorrow  ----------------- 85 year old male with history of coronary artery disease presenting today with chest pain concerning for ACS, he is chest pain-free on arrival.  Encouraged to present to the ED by his cardiologist.  Vital signs have remained stable throughout ED visit.  No tachycardia or hypoxia on room air, no pleurisy or current chest pain low suspicion for PE or dissection at this time.  Discussed case with cardiology Dr. Harrington Challenger who advises that we admit here at [facility] and keep patient n.p.o. after midnight.  Advised to admit patient to hospitalist service. - Patient reassessed resting comfortably no acute distress.  Patient is  agreeable to admission at this time. - Review of cardiology note filled by Dr. Harrington Challenger shows that they have asked that we check d-dimer, this is been added on. - Discussed case with hospitalist who was admitted patient to their service for further evaluation management.  Patient was seen and evaluated by Dr. Rogene Houston during this visit who agrees  with work-up and admission.  Note: Portions of this report may have been transcribed using voice recognition software. Every effort was made to ensure accuracy; however, inadvertent computerized transcription errors may still be present. Final Clinical Impressions(s) / ED Diagnoses   Final diagnoses:  Chest pain, unspecified type    ED Discharge Orders    None       Deliah Boston, PA-C 11/06/18 1600    Gari Crown 11/06/18 1656    Fredia Sorrow, MD 11/08/18 1620

## 2018-11-06 NOTE — Telephone Encounter (Signed)
Returned call to Marcie Bal (daughter).  Chest pain with exertion since yesterday off/on.  Does not have ntg at home.  SOB is normal for him and not increased.  No weight gain.  Has had off/on since then.  Complains of pain every time he gets up to do anything.  Did not feel bad enough to go to ED.  No sweating or n/v.

## 2018-11-06 NOTE — Telephone Encounter (Signed)
Wife and daughter informed and agrees to [place]. Daughter will call office back to review meds and allergies.   Patient verbally consented for telehealth visit with [facility] and understands that his insurance company will be billed for the encounter.

## 2018-11-06 NOTE — Patient Instructions (Addendum)
Medication Instructions:   Your physician recommends that you continue on your current medications as directed. Please refer to the Current Medication list given to you today.  Labwork:  NONE  Testing/Procedures:  NONE  Follow-Up:  Your physician recommends that you schedule a follow-up appointment in: 3 week virtual visit with Dr. Harl Bowie.  Any Other Special Instructions Will Be Listed Below (If Applicable).  Please go to the ED now for an evaluation-sent by provider.  If you need a refill on your cardiac medications before your next appointment, please call your pharmacy.

## 2018-11-06 NOTE — ED Notes (Signed)
Informed Marcie Bal of pts admission to hospital, pt spoke to daughter.

## 2018-11-06 NOTE — H&P (Signed)
History and Physical    Anthony Murray VQQ:[id number] DOB: March 19, 1928 DOA: 11/06/2018  PCP: [name], MD   Patient coming from: Home  I have personally briefly reviewed patient's old medical records in [place]  Chief Complaint: Chest pain  HPI: Anthony Murray is a 85 y.o. male with medical history significant for CAD, aortic valve stenosis, hypertension, DM 2, peripheral vascular disease, CKD 3, with complaints of chest pain that started 3 days ago.  Chest pain radiates down his left arm.  Reports chronic difficulty breathing which he uses his inhalers but this has not changed.  He has had multiple episodes since onset.  Chest pain is worse with exertion.,  Relieved by rest.   Patient called his cardiologist, who recommended ED evaluation.   ED Course: 130s to 160s, otherwise unremarkable vitals.  Heart rate 60s to 70s.  O2 sats greater than 91% on room air.  Unremarkable CBC, creatinine about baseline at 1.89.  Mild bump in troponin to 0.03.  EKG sinus rhythm, unchanged T wave abnormalities.  Cardiology was consulted in the ED-we will troponins, during age and renal insufficiency, plan for medical therapy, to consider invasive evaluation if large troponin rise, check d-dimer and n.p.o. from midnight. DDimer was obtained in the Ed and elevated at 1.7.   Review of Systems: As per HPI all other systems reviewed and negative.  Past Medical History:  Diagnosis Date  . Aortic valve stenosis   . ASCVD (arteriosclerotic cardiovascular disease)    50% ostial left left anterior desending 60% mid circumflex and normal ejection fractioon in 2006 exertional dyspnea;history of chest discomfort  . Cerebrovascular disease    minimal carotid bruits  . CKD (chronic kidney disease)   . Diabetes mellitus type 2, controlled ([place])    no insulin  . Hyperlipidemia    statin intolerant  . Hypertension   . Tobacco abuse, in remission    30 pack years stopped in 1971    Past Surgical  History:  Procedure Laterality Date  . APPENDECTOMY    . CARPAL TUNNEL RELEASE     surgery left 05/05/2001  . COLONOSCOPY N/A 10/01/2017   Procedure: COLONOSCOPY;  Surgeon: Jackquline Denmark, MD;  Location: [facility] ENDOSCOPY;  Service: Endoscopy;  Laterality: N/A;  . KNEE ARTHROSCOPY     bilaterial; unknown associated surgical procedure  . SHOULDER SURGERY     Right x2; third procedure on left  . TEE WITHOUT CARDIOVERSION N/A 07/29/2017   Procedure: TRANSESOPHAGEAL ECHOCARDIOGRAM (TEE) WITH PROPOFOL;  Surgeon: Satira Sark, MD;  Location: [facility] ENDO SUITE;  Service: Cardiovascular;  Laterality: N/A;  . TONSILLECTOMY    . TRANSURETHRAL INCISION OF PROSTATE     resection of the prostate     reports that he quit smoking about 49 years ago. His smoking use included cigarettes. He has a 40.00 pack-year smoking history. He has never used smokeless tobacco. He reports that he does not drink alcohol or use drugs.  Allergies  Allergen Reactions  . Codeine Other (See Comments)    Reaction not recalled  . Ezetimibe-Simvastatin Other (See Comments)    Muscle pain  . Naproxen Other (See Comments)    Reaction not recalled  . Niacin Other (See Comments)    Reaction not recalled  . Nsaids Other (See Comments)    Reaction not recalled  . Pravastatin Sodium Other (See Comments)    Muscle pain    Family History  Problem Relation Age of Onset  . Multiple sclerosis  Mother   . Heart disease Father   . Peripheral vascular disease Sister     Prior to Admission medications   Medication Sig Start Date End Date Taking? Authorizing Provider  acetaminophen (TYLENOL) 325 MG tablet Take 325-650 mg by mouth every 6 (six) hours as needed (for pain).    Yes [provider]  albuterol (PROVENTIL HFA;VENTOLIN HFA) 108 (90 Base) MCG/ACT inhaler Inhale 2 puffs into the lungs every 4 (four) hours as needed for wheezing or shortness of breath.    Yes [provider]  amLODipine (NORVASC) 5 MG tablet  Take 1 tablet (5 mg total) by mouth daily. 02/16/18 11/06/18 Yes BranchAlphonse Guild, MD  aspirin 81 MG tablet Take 1 tablet (81 mg total) by mouth daily. 10/03/17  Yes Irene Pap N, DO  betaxolol (BETOPTIC-S) 0.5 % ophthalmic suspension Place 1 drop into both eyes every morning.  12/15/15  Yes [provider]  bimatoprost (LUMIGAN) 0.01 % SOLN Place 1 drop into both eyes every morning.    Yes [provider]  carvedilol (COREG) 12.5 MG tablet Take 12.5 mg by mouth 2 (two) times daily with a meal.  09/28/12  Yes [provider]  Cranberry 500 MG CAPS Take 500 mg by mouth every evening.    Yes [provider]  dorzolamide (TRUSOPT) 2 % ophthalmic solution Place 1 drop into both eyes 2 (two) times daily.    Yes [provider]  fexofenadine (ALLEGRA) 180 MG tablet Take 180 mg by mouth daily.   Yes [provider]  Fluticasone-Umeclidin-Vilant (TRELEGY ELLIPTA) 100-62.5-25 MCG/INH AEPB Inhale 1 puff into the lungs daily.   Yes [provider]  glipiZIDE (GLUCOTROL XL) 5 MG 24 hr tablet Take 5 mg by mouth daily with breakfast.    Yes [provider]  ipratropium-albuterol (DUONEB) 0.5-2.5 (3) MG/3ML SOLN Take 3 mLs by nebulization every 4 (four) hours as needed. 10/03/17  Yes Hall, Carole N, DO  Netarsudil Dimesylate (RHOPRESSA) 0.02 % SOLN Place 1 drop into the left eye at bedtime.    Yes [provider]  oxybutynin (DITROPAN) 5 MG tablet Take 5 mg by mouth daily.    Yes [provider]  pantoprazole (PROTONIX) 40 MG tablet Take 1 tablet (40 mg total) by mouth 2 (two) times daily. 10/03/17  Yes Kayleen Memos, DO  pilocarpine (PILOCAR) 2 % ophthalmic solution Place 1 drop into the left eye 4 (four) times daily.    Yes [provider]  polyethylene glycol (MIRALAX / GLYCOLAX) packet Take 17 g by mouth daily. 10/04/17  Yes Irene Pap N, DO  potassium chloride (K-DUR) 10 MEQ tablet Take 10 mEq by mouth daily. ONLY  WHILE TAKING LASIX   Yes [provider]  sitaGLIPtin (JANUVIA) 100 MG tablet Take 50 mg by mouth daily.   Yes [provider]  tamsulosin (FLOMAX) 0.4 MG CAPS capsule Take 0.4 mg by mouth daily.   Yes [provider]  torsemide (DEMADEX) 10 MG tablet Take 10 mg by mouth daily.   Yes [provider]    Physical Exam: Vitals:   11/06/18 1600 11/06/18 1630 11/06/18 1730 11/06/18 1838  BP: (!) 150/85 106/76 139/75 (!) 162/76  Pulse: 71 70 70 70  Resp: (!) 24 19 20 17   Temp:    98.4 F (36.9 C)  TempSrc:    Oral  SpO2: 94% 96% 92% 95%  Weight:      Height:  Constitutional: NAD, calm, comfortable Vitals:   11/06/18 1600 11/06/18 1630 11/06/18 1730 11/06/18 1838  BP: (!) 150/85 106/76 139/75 (!) 162/76  Pulse: 71 70 70 70  Resp: (!) 24 19 20 17   Temp:    98.4 F (36.9 C)  TempSrc:    Oral  SpO2: 94% 96% 92% 95%  Weight:      Height:       Eyes: PERRL, lids and conjunctivae normal ENMT: Mucous membranes are moist. Posterior pharynx clear of any exudate or lesions.Normal dentition.  Neck: normal, supple, no masses, no thyromegaly Respiratory: clear to auscultation bilaterally, no wheezing, no crackles. Normal respiratory effort. No accessory muscle use.  Cardiovascular: Regular rate and rhythm, no murmurs / rubs / gallops. No extremity edema. 2+ pedal pulses. No carotid bruits.  Abdomen: no tenderness, no masses palpated. No hepatosplenomegaly. Bowel sounds positive.  Musculoskeletal: no clubbing / cyanosis. No joint deformity upper and lower extremities. Good ROM, no contractures. Normal muscle tone.  Skin: Chronic stasis changes anterior surface of both legs.  No lesions, ulcers. No induration Neurologic: CN 2-12 grossly intact.  Strength 5/5 in all 4.  Psychiatric: Normal judgment and insight. Alert and oriented x 3. Normal mood.   Labs on Admission: I have personally reviewed following labs and imaging studies  CBC: Recent Labs   Lab 11/06/18 1240  WBC 6.1  NEUTROABS 3.5  HGB 15.7  HCT 48.0  MCV 90.9  PLT 269   Basic Metabolic Panel: Recent Labs  Lab 11/06/18 1243  NA 141  K 3.7  CL 107  CO2 27  GLUCOSE 170*  BUN 22  CREATININE 1.89*  CALCIUM 9.1   Cardiac Enzymes: Recent Labs  Lab 11/06/18 1243 11/06/18 1608  TROPONINI 0.03* <0.03    Radiological Exams on Admission: Nm Pulmonary Perfusion  Result Date: 11/06/2018 CLINICAL DATA:  85 year old male with chest pain and shortness of breath. EXAM: NUCLEAR MEDICINE PERFUSION LUNG SCAN TECHNIQUE: Perfusion images were obtained in multiple projections after intravenous injection of radiopharmaceutical. Ventilation scans intentionally deferred if perfusion scan and chest x-ray adequate for interpretation during COVID 19 epidemic. RADIOPHARMACEUTICALS:  1.5 mCi Tc-58m MAA IV COMPARISON:  Portable chest earlier today. FINDINGS: Homogeneous perfusion radiotracer activity in both lungs. No perfusion defect to suggest acute pulmonary embolus. Ventilation imaging was deferred. IMPRESSION: Normal perfusion imaging, no evidence of acute pulmonary embolus. Electronically Signed   By: Genevie Ann M.D.   On: 11/06/2018 19:22   Dg Chest Port 1 View  Result Date: 11/06/2018 CLINICAL DATA:  Chest pain short of breath EXAM: PORTABLE CHEST 1 VIEW COMPARISON:  07/29/2017 FINDINGS: Hypoventilation with mild left lower lobe atelectasis. Negative for heart failure or pneumonia. No pleural effusion. IMPRESSION: Mild left lower lobe atelectasis. Electronically Signed   By: Franchot Gallo M.D.   On: 11/06/2018 13:08    EKG: Independently reviewed.  Sinus rhythm.  QTc 452.  Old T wave abnormalities - V3 through V6, 1 and aVL.  Assessment/Plan Active Problems:   Chest pain  Chest pain with CAD hx - typical chest pain.  History of CAD-cath 11/2004 with moderate nonobstructive disease.  And Aortic stenosis history.  Troponin x2- neg.  EKG unchanged from prior. Evaluated by cardiology  in ED, Dr. Harrington Challenger- conservative management unless significant increase in troponin.  D-dimer elevated- 1.71. - Trend Troponin - EKG a.m. - Obtain VQ scan-normal perfusion imaging, no evidence of embolism - PRN nitroglycerin, IV morphine - 325mg  aspirin x 1, then Continue home aspirin,carvedilol (allegy to statin) -  NPO midnight - Will defer decision to get echo to cardiology.  Hypertension-130s to 160s. -Continue home carvedilol, tamsulosin, torsemide,  DM2-random glucose 170. -Hold home sitagliptin and glipizide - SSI  CKD3- Cr 1.89.  Stable. - Cont home torsemide and K supplements.  Aortic stenosis- follows with Dr. Harl Bowie.  Last echo 10/2017.  Ef 55 To 60%, G1DD.  Moderate stenosis, with mild regurg.  DVT prophylaxis: Lovenox Code Status: Full Family Communication: None at bedside Disposition Plan: 1-2 days Consults called: Cardiology Admission status: Obs, Tele  Bethena Roys MD [facility]  11/06/2018, 8:00 PM

## 2018-11-06 NOTE — ED Triage Notes (Signed)
Pt c/o recurrent episodes of CP radiating down LT arm and SOB since Friday.

## 2018-11-06 NOTE — Telephone Encounter (Signed)
Can we put this patient in as a virtual visit with me at 1120am today. I have already seen the 1130 so ok to do, I will cc Alma Friendly as well who is helping me today   Zandra Abts MD

## 2018-11-06 NOTE — ED Notes (Signed)
Date and time results received: 11/06/18 1400  Test: Troponin Critical Value: 0.03  Name of Provider Notified: Athena Masse, [place]   Orders Received? Or Actions Taken?: See chart

## 2018-11-06 NOTE — ED Provider Notes (Signed)
Medical screening examination/treatment/procedure(s) were conducted as a shared visit with non-physician practitioner(s) and myself.  I personally evaluated the patient during the encounter.  EKG Interpretation  Date/Time:  Monday Nov 06 2018 12:40:51 EDT Ventricular Rate:  72 PR Interval:    QRS Duration: 99 QT Interval:  413 QTC Calculation: 452 R Axis:   -29 Text Interpretation:  Sinus rhythm Borderline left axis deviation Abnormal R-wave progression, early transition Nonspecific T abnrm, anterolateral leads Baseline wander in lead(s) V3 No significant change since last tracing Confirmed by Fredia Sorrow [id number]) on 11/06/2018 12:48:42 PM   Results for orders placed or performed during the hospital encounter of 11/06/18  SARS Coronavirus 2 (CEPHEID - Performed in [place] hospital lab), [facility] Order  Result Value Ref Range   SARS Coronavirus 2 NEGATIVE NEGATIVE  Basic metabolic panel  Result Value Ref Range   Sodium 141 135 - 145 mmol/L   Potassium 3.7 3.5 - 5.1 mmol/L   Chloride 107 98 - 111 mmol/L   CO2 27 22 - 32 mmol/L   Glucose, Bld 170 (H) 70 - 99 mg/dL   BUN 22 8 - 23 mg/dL   Creatinine, Ser 1.89 (H) 0.61 - 1.24 mg/dL   Calcium 9.1 8.9 - 10.3 mg/dL   GFR calc non Af Amer 31 (L) >60 mL/min   GFR calc Af Amer 36 (L) >60 mL/min   Anion gap 7 5 - 15  Troponin I - Once  Result Value Ref Range   Troponin I 0.03 (HH) <0.03 ng/mL  CBC with Differential  Result Value Ref Range   WBC 6.1 4.0 - 10.5 K/uL   RBC 5.28 4.22 - 5.81 MIL/uL   Hemoglobin 15.7 13.0 - 17.0 g/dL   HCT 48.0 39.0 - 52.0 %   MCV 90.9 80.0 - 100.0 fL   MCH 29.7 26.0 - 34.0 pg   MCHC 32.7 30.0 - 36.0 g/dL   RDW 15.1 11.5 - 15.5 %   Platelets 169 150 - 400 K/uL   nRBC 0.0 0.0 - 0.2 %   Neutrophils Relative % 57 %   Neutro Abs 3.5 1.7 - 7.7 K/uL   Lymphocytes Relative 25 %   Lymphs Abs 1.6 0.7 - 4.0 K/uL   Monocytes Relative 13 %   Monocytes Absolute 0.8 0.1 - 1.0 K/uL   Eosinophils Relative 4 %   Eosinophils Absolute 0.3 0.0 - 0.5 K/uL   Basophils Relative 1 %   Basophils Absolute 0.0 0.0 - 0.1 K/uL   Immature Granulocytes 0 %   Abs Immature Granulocytes 0.01 0.00 - 0.07 K/uL   Patient seen by me along with physician assistant.  Patient since Friday with some intermittent chest pain.  Occurs at rest will be severe for 15 minutes.  Made worse by walking.  So it does last longer than 15 minutes totally.  No pain currently.  Does radiate down his left arm.  Associated with shortness of breath.  Patient's troponin borderline elevated at 0.03.  EKG without acute changes.  Patient symptoms are concerning for an anginal equivalent.  Discussed with the cardiologist state patient is okay for admission here.  Physician assistant will discuss with hospitalist for admission.  Clinically does not sound as if it is pulmonary embolus in nature.  Patient not tachycardic.  Oxygen saturations on room air are fine.  He is afebrile.  COVID testing is pending.  EKG without any acute cardiac changes.  Chest x-ray without any significant findings.  Results for orders placed or  performed during the hospital encounter of 11/06/18  SARS Coronavirus 2 (CEPHEID - Performed in [place] hospital lab), [facility] Order  Result Value Ref Range   SARS Coronavirus 2 NEGATIVE NEGATIVE  Basic metabolic panel  Result Value Ref Range   Sodium 141 135 - 145 mmol/L   Potassium 3.7 3.5 - 5.1 mmol/L   Chloride 107 98 - 111 mmol/L   CO2 27 22 - 32 mmol/L   Glucose, Bld 170 (H) 70 - 99 mg/dL   BUN 22 8 - 23 mg/dL   Creatinine, Ser 1.89 (H) 0.61 - 1.24 mg/dL   Calcium 9.1 8.9 - 10.3 mg/dL   GFR calc non Af Amer 31 (L) >60 mL/min   GFR calc Af Amer 36 (L) >60 mL/min   Anion gap 7 5 - 15  Troponin I - Once  Result Value Ref Range   Troponin I 0.03 (HH) <0.03 ng/mL  CBC with Differential  Result Value Ref Range   WBC 6.1 4.0 - 10.5 K/uL   RBC 5.28 4.22 - 5.81 MIL/uL   Hemoglobin 15.7 13.0 - 17.0 g/dL   HCT 48.0 39.0 - 52.0  %   MCV 90.9 80.0 - 100.0 fL   MCH 29.7 26.0 - 34.0 pg   MCHC 32.7 30.0 - 36.0 g/dL   RDW 15.1 11.5 - 15.5 %   Platelets 169 150 - 400 K/uL   nRBC 0.0 0.0 - 0.2 %   Neutrophils Relative % 57 %   Neutro Abs 3.5 1.7 - 7.7 K/uL   Lymphocytes Relative 25 %   Lymphs Abs 1.6 0.7 - 4.0 K/uL   Monocytes Relative 13 %   Monocytes Absolute 0.8 0.1 - 1.0 K/uL   Eosinophils Relative 4 %   Eosinophils Absolute 0.3 0.0 - 0.5 K/uL   Basophils Relative 1 %   Basophils Absolute 0.0 0.0 - 0.1 K/uL   Immature Granulocytes 0 %   Abs Immature Granulocytes 0.01 0.00 - 0.07 K/uL   Dg Chest Port 1 View  Result Date: 11/06/2018 CLINICAL DATA:  Chest pain short of breath EXAM: PORTABLE CHEST 1 VIEW COMPARISON:  07/29/2017 FINDINGS: Hypoventilation with mild left lower lobe atelectasis. Negative for heart failure or pneumonia. No pleural effusion. IMPRESSION: Mild left lower lobe atelectasis. Electronically Signed   By: Franchot Gallo M.D.   On: 11/06/2018 13:08      Fredia Sorrow, MD 11/06/18 1521

## 2018-11-06 NOTE — Addendum Note (Signed)
Addended by: Merlene Laughter on: 11/06/2018 11:13 AM   Modules accepted: Orders

## 2018-11-06 NOTE — Telephone Encounter (Signed)
Pt c/o of Chest Pain: 1. Are you having CP right now?  no 2. Are you experiencing any other symptoms (ex. SOB, nausea, vomiting, sweating)? No (patient has SOB a lot)  3. How long have you been experiencing CP?  11-05-2018   4. Is your CP continuous or coming and going? Off and On  5. Have you taken Nitroglycerin? No   Patient states that when he starts to do something the pain sets in.  Please call Marcie Bal (daughter) [phone]

## 2018-11-06 NOTE — Progress Notes (Signed)
Cardiology  Reviewed with Dr Harl Bowie and ED physician Dr Rogene Houston  Pt with CP progressive over weekend  Hx CAD (remote cath showed moderate CAD), mild AS (on echo 2019) Given age and renal insufficiency would recomm admit with cycle of troponin    Maximize/optimize medical Rx If large trop rise consider invasive evaluation but not automatically.  Check D Dimer   Keep NPO after MN  Cardiology will see in AM  Dorris Carnes MD

## 2018-11-06 NOTE — Progress Notes (Signed)
Virtual Visit via Video Note   This visit type was conducted due to national recommendations for restrictions regarding the COVID-19 Pandemic (e.g. social distancing) in an effort to limit this patient's exposure and mitigate transmission in our community.  Due to his co-morbid illnesses, this patient is at least at moderate risk for complications without adequate follow up.  This format is felt to be most appropriate for this patient at this time.  All issues noted in this document were discussed and addressed.  A limited physical exam was performed with this format.  Please refer to the patient's chart for his consent to telehealth for [facility].   Date:  11/06/2018   ID:  Anthony Murray, DOB Jun 21, 1928, MRN [id number]  Patient Location: Home Provider Location: Office  PCP:  [name], MD  Cardiologist:  [name], MD  Electrophysiologist:  None   Evaluation Performed:  Follow up visit  Chief Complaint:  Chest pain  History of Present Illness:    Anthony Murray is a 85 y.o. male seen today for a focused add on visit for chest pain. For his complex medical history please refer to prior clinic note    1. CAD - cath 11/2004 with moderate non-obstructive disease - echo 09/2012 LVEF 55-60%  -2 episodes chest pain. Unsure of character of pain, moderate to severe. Lasted about 10 minutes. No other associated symptoms. Not positional. Last episode about 3 weeks.   - recent chest pain. Started on Friday night.  - pain in chest and in left arm. Difficult to describe. 8/10 in severity. No other associated symptoms. Longest duration 10-15 minutes. Worst with exertion. Different from prior chest pains - 6 episodes since Friday. Progressing in duration.         The patient does not have symptoms concerning for COVID-19 infection (fever, chills, cough, or new shortness of breath).    Past Medical History:  Diagnosis Date  . Aortic valve stenosis   . ASCVD  (arteriosclerotic cardiovascular disease)    50% ostial left left anterior desending 60% mid circumflex and normal ejection fractioon in 2006 exertional dyspnea;history of chest discomfort  . Cerebrovascular disease    minimal carotid bruits  . CKD (chronic kidney disease)   . Diabetes mellitus type 2, controlled ([place])    no insulin  . Hyperlipidemia    statin intolerant  . Hypertension   . Tobacco abuse, in remission    30 pack years stopped in 1971   Past Surgical History:  Procedure Laterality Date  . APPENDECTOMY    . CARPAL TUNNEL RELEASE     surgery left 05/05/2001  . COLONOSCOPY N/A 10/01/2017   Procedure: COLONOSCOPY;  Surgeon: Jackquline Denmark, MD;  Location: [facility] ENDOSCOPY;  Service: Endoscopy;  Laterality: N/A;  . KNEE ARTHROSCOPY     bilaterial; unknown associated surgical procedure  . SHOULDER SURGERY     Right x2; third procedure on left  . TEE WITHOUT CARDIOVERSION N/A 07/29/2017   Procedure: TRANSESOPHAGEAL ECHOCARDIOGRAM (TEE) WITH PROPOFOL;  Surgeon: Satira Sark, MD;  Location: [facility] ENDO SUITE;  Service: Cardiovascular;  Laterality: N/A;  . TONSILLECTOMY    . TRANSURETHRAL INCISION OF PROSTATE     resection of the prostate     No outpatient medications have been marked as taking for the 11/06/18 encounter (Appointment) with [name], MD.     Allergies:   Codeine; Ezetimibe-simvastatin; Naproxen; Niacin; Nsaids; and Pravastatin sodium   Social History   Tobacco Use  .  Smoking status: Former Smoker    Packs/day: 1.00    Years: 40.00    Pack years: 40.00    Types: Cigarettes    Last attempt to quit: 10/29/1969    Years since quitting: 49.0  . Smokeless tobacco: Never Used  Substance Use Topics  . Alcohol use: No    Alcohol/week: 0.0 standard drinks  . Drug use: No     Family Hx: The patient's family history includes Heart disease in his father; Multiple sclerosis in his mother; Peripheral vascular disease in his sister.  ROS:   Please see  the history of present illness.     All other systems reviewed and are negative.   Prior CV studies:   The following studies were reviewed today:  09/2012 stress echo:  Study Conclusions  - Stress ECG conclusions: There were no significant stress arrhythmias or conduction abnormalities; rare PVCs were present during exercise and in recovery. The stress ECG was negative for ischemia. - Staged echo: There was no echocardiographic evidence for stress-induced ischemia.  09/2012 echo Study Conclusions  - Left ventricle: The cavity size was normal. Wall thickness was increased in a pattern of mild LVH. There was moderate focal basal hypertrophy of the septum. Systolic function was normal. The estimated ejection fraction was in the range of 55% to 60%. Wall motion was normal; there were no regional wall motion abnormalities. - Aortic valve: Mildly to moderately calcified annulus. Moderately thickened, moderately calcified leaflets. Valve mobility was mildly restricted. There was mild stenosis. Trivial regurgitation. Valve area: 0.9cm^2(VTI). Valve area: 0.79cm^2 (Vmax). - Aorta: The aorta was moderately calcified. - Mitral valve: Calcified annulus. Mildly calcified leaflets . - Atrial septum: No defect or patent foramen ovale was identified.  11/2004 Cath FINDINGS: 1. Patient does not have dextrocardia. The heart is normally located in  the left side of the chest. Aortic arch appears normal based on the  course of the catheter through it. 2. LV 167/19/25. 3. No aortic stenosis. 4. Left main: Angiographically normal. 5. LAD: Large vessel giving rise to a single, very large, diagonal. There  is a 50% stenosis of the ostium of the LAD. 6. Circumflex: Large vessel giving rise to three obtuse marginals. There  is a 60% stenosis of the mid vessel. 7. RCA: Tortuous, dominant vessel. It is angiographically normal.   IMPRESSION: 1. No dextrocardia. 2. No previously placed coronary stent. 3. Moderate nonobstructive coronary disease. 4. Elevated left ventricular end diastolic pressure in the setting of  systemic hypertension.  08/2014 echo Study Conclusions  - Left ventricle: The cavity size was normal. There was mild focal basal hypertrophy of the septum. Systolic function was normal. The estimated ejection fraction was in the range of 55% to 60%. Wall motion was normal; there were no regional wall motion abnormalities. Doppler parameters are consistent with abnormal left ventricular relaxation (grade 1 diastolic dysfunction). - Aortic valve: Functionally bicuspid; moderately calcified leaflets. Cusp separation was reduced. There was moderate to severe stenosis. There was mild regurgitation. Mean gradient (S): 11 mm Hg. Peak gradient (S): 22 mm Hg. VTI ratio of LVOT to aortic valve: 0.44. Valve area (VTI): 0.99 cm^2. - Mitral valve: Calcified annulus. There was trivial regurgitation. - Left atrium: The atrium was mildly dilated. - Right atrium: Central venous pressure (est): 8 mm Hg. - Tricuspid valve: There was trivial regurgitation. - Pulmonary arteries: Systolic pressure could not be accurately estimated. - Pericardium, extracardiac: There was no pericardial effusion.  Impressions:  - Mild basal septal hypertrophy with  LVEF 55-60% and grade 1 diastolic dysfunction. Mild left atrial enlargement. Functionally bicuspid aortic valve with mild aortic regurgitation and moderate to severe aortic stenosis. Measures are somewhat discordant with relatively low gradients and LVOT/AV ratio 0.44, but planimetry and VTI valve area around 1 cm2. Unable to assess PASP.  01/2015 PFTs Mild to moderate ventilatory defect   08/2015 echo  Study Conclusions  - Left ventricle: The cavity size was normal. Wall thickness was  increased in a pattern of  mild LVH. Systolic function was normal.  The estimated ejection fraction was in the range of 60% to 65%.  Wall motion was normal; there were no regional wall motion  abnormalities. Doppler parameters are consistent with abnormal  left ventricular relaxation (grade 1 diastolic dysfunction). - Aortic valve: Mildly to moderately calcified annulus.  Functionally bicuspid. There was mild regurgitation. Mean  gradient (S): 10 mm Hg. Peak gradient (S): 20 mm Hg. VTI ratio of  LVOT to aortic valve: 0.39. Valve area (VTI): 0.88 cm^2. - Mitral valve: Calcified annulus. There was trivial regurgitation. - Left atrium: The atrium was at the upper limits of normal in  size. - Right atrium: Central venous pressure (est): 3 mm Hg. - Tricuspid valve: There was trivial regurgitation. - Pulmonary arteries: Systolic pressure could not be accurately  estimated. - Pericardium, extracardiac: There was no pericardial effusion.  Impressions:  - Mild LVH with LVEF 60-65%. Grade 1 diastolic dysfunction with  normal estimated LV filling pressure. Upper normal left atrial  chamber size. MAC with trivial mitral regurgitation. Functionally  bicuspid, calcified aortic valve with at least moderate aortic  stenosis. As mentioned in the prior study, measurements are  discordant with relatively low gradients but LVOT/AV VTI ratio  0.39. Unable to get good valve planimetry on this particular  study. There is mild aortic regurgitation.  07/2017 echo Study Conclusions  - Left ventricle: The cavity size was normal. Wall thickness was increased in a pattern of mild LVH. Systolic function was normal. The estimated ejection fraction was in the range of 55% to 60%. Wall motion was normal; there were no regional wall motion abnormalities. Doppler parameters are consistent with abnormal left ventricular relaxation (grade 1 diastolic dysfunction). - Aortic valve: Functionally bicuspid; moderately  calcified leaflets. There was moderate to severe stenosis. There was mild regurgitation. Mean gradient (S): 11 mm Hg. Peak gradient (S): 20 mm Hg. VTI ratio of LVOT to aortic valve: 0.35. Valve area (VTI): 0.88 cm^2. - Mitral valve: Mildly calcified annulus. There was mild regurgitation. There is an intermittently seen echodensity seen on the atrial side of the anterior mitral leaflet concerning for vegetation in patient with history of fever and stroke. - Left atrium: The atrium was mildly dilated. - Right atrium: Central venous pressure (est): 8 mm Hg. - Tricuspid valve: There was trivial regurgitation. - Pulmonary arteries: Systolic pressure could not be accurately estimated. - Pericardium, extracardiac: There was no pericardial effusion.  Impressions:  - There is an intermittently seen echodensity seen on the atrial side of the anterior mitral leaflet concerning for vegetation in patient with history of fever and stroke.   07/2017 TEE Study Conclusions  - Left ventricle: Systolic function was normal. The estimated ejection fraction was in the range of 55% to 60%. Wall motion was normal; there were no regional wall motion abnormalities. - Aortic valve: Small, freely mobile echodensity seen intermittently on the ventricular side of the right coronary aortic cusp suggestive of vegetation given clinical scenario. There was moderate stenosis. Valve area  approximately 1.4 cm2 by planimetry. There was mild regurgitation. - Aorta: Moderate to severe diffuse atherosclerosis present. - Mitral valve: Very small, freely mobile echodensity on the atrial side of the posterior mitral leaflet suggestive of vegetation given clinical scenario. The larger abnormality in region of anterior leaflet noted on the transthoracic study is not present - perhaps more reflective of artifact. Mildly calcified annulus. There was mild regurgitation. - Left  atrium: The atrium was dilated. No evidence of thrombus in the atrial cavity or appendage. Emptying velocity was normal. - Right atrium: No evidence of thrombus in the atrial cavity or appendage. - Atrial septum: No significant defect or patent foramen ovale was identified by color Doppler or agitated saline injection. - Tricuspid valve: There was trivial regurgitation. - Pulmonic valve: There was mild regurgitation. - Pericardium, extracardiac: There was no pericardial effusion.  Impressions:  - Very small, freely mobile echodensity on the atrial side of the posterior mitral leaflet suggestive of vegetation given clinical scenario. The larger abnormality in region of anterior leaflet noted on the transthoracic study is not present - perhaps more reflective of artifact. There is also a small, freely mobile echodensity seen intermittently on the ventricular side of the right coronary aortic cusp suggestive of vegetation given clinical scenario. No significant defect or patent foramen ovale was identified by color Doppler or agitated saline injection   10/2017 echo Study Conclusions  - Left ventricle: The cavity size was normal. Wall thickness was increased in a pattern of mild LVH. Systolic function was normal. The estimated ejection fraction was in the range of 55% to 60%. Wall motion was normal; there were no regional wall motion abnormalities. Doppler parameters are consistent with abnormal left ventricular relaxation (grade 1 diastolic dysfunction). - Aortic valve: Moderately calcified annulus. Probably trileaflet; mildly calcified leaflets. Valve mobility was restricted. There was moderate stenosis. There was mild regurgitation. Mean gradient (S): 12 mm Hg. VTI ratio of LVOT to aortic valve: 0.37. Valve area (VTI): 1.27 cm^2. - Mitral valve: Mildly calcified annulus. Mildly thickened, mildly calcified leaflets. There was mild  regurgitation. - Right atrium: Central venous pressure (est): 3 mm Hg. - Atrial septum: No defect or patent foramen ovale was identified. - Tricuspid valve: There was trivial regurgitation. - Pulmonary arteries: PA peak pressure: 19 mm Hg (S). - Pericardium, extracardiac: There was no pericardial effusion.  Impressions:  - No definitive valvular vegetations noted.  Labs/Other Tests and Data Reviewed:    EKG:  No ECG reviewed.  Recent Labs: 12/26/2017: BUN 21; Creatinine, Ser 1.73; Magnesium 2.0; Potassium 4.5; Sodium 143   Recent Lipid Panel Lab Results  Component Value Date/Time   CHOL 164 07/28/2017 04:01 AM   TRIG 156 (H) 07/28/2017 04:01 AM   HDL 25 (L) 07/28/2017 04:01 AM   CHOLHDL 6.6 07/28/2017 04:01 AM   LDLCALC 108 (H) 07/28/2017 04:01 AM    Wt Readings from Last 3 Encounters:  03/13/18 196 lb (88.9 kg)  02/23/18 192 lb (87.1 kg)  02/09/18 192 lb (87.1 kg)     Objective:    Vital Signs:  There were no vitals taken for this visit.   Today's Vitals   11/06/18 1114  BP: (!) 149/77  Pulse: 76  Height: 5' 6.5" (1.689 m)   Body mass index is 31.16 kg/m.   ASSESSMENT & PLAN:    1. CAD - moderate disease by cath 2006 - 6 episodes of chest pain since Friday, 8/10 in severity. Somewhat difficult historian over the phone due to hearing  deficit. Symptoms are worrisome enough he needs further evaluation, I have recommended he present to [facility] ER.  - advanced age and comorbidities, renal dysfunction, would see what initial workup shows,  in general would have higher threshold for invasive testing     COVID-19 Education: The signs and symptoms of COVID-19 were discussed with the patient and how to seek care for testing (follow up with PCP or arrange E-visit).  The importance of social distancing was discussed today.  Time:   Today, I have spent 15 minutes with the patient with telehealth technology discussing the above problems.     Medication  Adjustments/Labs and Tests Ordered: Current medicines are reviewed at length with the patient today.  Concerns regarding medicines are outlined above.   Tests Ordered: No orders of the defined types were placed in this encounter.   Medication Changes: No orders of the defined types were placed in this encounter.   Disposition:  Sent to ER for evaluation. F/u with me 3 weeks   Signed, [name], MD  11/06/2018 10:38 AM    [facility]

## 2018-11-07 ENCOUNTER — Observation Stay (HOSPITAL_BASED_OUTPATIENT_CLINIC_OR_DEPARTMENT_OTHER): Payer: PPO

## 2018-11-07 DIAGNOSIS — I10 Essential (primary) hypertension: Secondary | ICD-10-CM

## 2018-11-07 DIAGNOSIS — I25118 Atherosclerotic heart disease of native coronary artery with other forms of angina pectoris: Secondary | ICD-10-CM | POA: Diagnosis not present

## 2018-11-07 DIAGNOSIS — R079 Chest pain, unspecified: Secondary | ICD-10-CM | POA: Diagnosis not present

## 2018-11-07 DIAGNOSIS — I25119 Atherosclerotic heart disease of native coronary artery with unspecified angina pectoris: Secondary | ICD-10-CM | POA: Diagnosis not present

## 2018-11-07 DIAGNOSIS — N400 Enlarged prostate without lower urinary tract symptoms: Secondary | ICD-10-CM | POA: Diagnosis not present

## 2018-11-07 DIAGNOSIS — E118 Type 2 diabetes mellitus with unspecified complications: Secondary | ICD-10-CM | POA: Diagnosis not present

## 2018-11-07 DIAGNOSIS — Z8673 Personal history of transient ischemic attack (TIA), and cerebral infarction without residual deficits: Secondary | ICD-10-CM

## 2018-11-07 DIAGNOSIS — I1310 Hypertensive heart and chronic kidney disease without heart failure, with stage 1 through stage 4 chronic kidney disease, or unspecified chronic kidney disease: Secondary | ICD-10-CM | POA: Diagnosis not present

## 2018-11-07 DIAGNOSIS — N183 Chronic kidney disease, stage 3 (moderate): Secondary | ICD-10-CM

## 2018-11-07 DIAGNOSIS — I350 Nonrheumatic aortic (valve) stenosis: Secondary | ICD-10-CM

## 2018-11-07 LAB — GLUCOSE, CAPILLARY
Glucose-Capillary: 118 mg/dL — ABNORMAL HIGH (ref 70–99)
Glucose-Capillary: 139 mg/dL — ABNORMAL HIGH (ref 70–99)
Glucose-Capillary: 137 mg/dL — ABNORMAL HIGH (ref 70–99)
Glucose-Capillary: 138 mg/dL — ABNORMAL HIGH (ref 70–99)
Glucose-Capillary: 158 mg/dL — ABNORMAL HIGH (ref 70–99)

## 2018-11-07 LAB — TROPONIN I: Troponin I: <0.03 ng/mL (ref <0.03)

## 2018-11-07 LAB — ECHOCARDIOGRAM COMPLETE
Height: 66.5 in
Weight: 3200 oz

## 2018-11-07 MED ORDER — ISOSORBIDE MONONITRATE ER 30 MG PO TB24: 30.0000 mg | ORAL_TABLET | Freq: Every day | ORAL | 1 refills | Status: DC

## 2018-11-07 MED ORDER — ISOSORBIDE MONONITRATE ER 60 MG PO TB24
30.0000 mg | ORAL_TABLET | Freq: Every day | ORAL | Status: DC
  Administered 2018-11-07: 30 mg via ORAL
  Filled 2018-11-07: qty 1

## 2018-11-07 NOTE — Consult Note (Signed)
Cardiology Consultation:   Patient ID: Anthony Murray MRN: [id number]; DOB: September 09, 1927  Admit date: 11/06/2018 Date of Consult: 11/07/2018  Primary Care Provider: [name], MD Primary Cardiologist: [name], MD Primary Electrophysiologist:  None    Patient Profile:   Anthony Murray is a 85 y.o. male with a hx of aortic stenosis and CAD who is being seen today for the evaluation of chest pain at the request of Dr Dyann Kief.  History of Present Illness:   Anthony Murray is an 85 year old male followed by Dr. Harl Bowie.  He has a history of moderate CAD at cath in 2016 with a 50% ostial LAD lesion and a 60% mid CFX stenosis.  He also has moderate to severe aortic stenosis.  His last echocardiogram was in May 2019.  This showed an EF of 55 to 60%.  His mean gradient was 12 mmHg.  This echo was done after an episode of treated endocarditis in February 2019.  The echo in May showed resolution of his endocarditis.   In addition to the above problems the patient has moderate carotid disease by Doppler, hypertension, CRI stage III, COPD, NIDDM, prior GI bleeding, and a history of prior CVA- (secondary to endocarditis Feb 2019).  He called the office yesterday complaining of exertional chest pain.  The patient denied any rest symptoms.  He did say he felt like he was unable to do anything exertional.  It was felt he would be best evaluated at the hospital and was instructed to go to the emergency room at [facility].  In the emergency room his POC marker was 0.03.  His troponin I's were also 0.03 X 3.  His d-dimer was slightly elevated and a VQ scan was obtained, this was negative for pulmonary embolism.  His EKG had no acute changes.  He was admitted for further evaluation.  Past Medical History:  Diagnosis Date  . Aortic valve stenosis   . ASCVD (arteriosclerotic cardiovascular disease)    50% ostial left left anterior desending 60% mid circumflex and normal ejection fractioon in 2006 exertional  dyspnea;history of chest discomfort  . Cerebrovascular disease    minimal carotid bruits  . CKD (chronic kidney disease)   . Diabetes mellitus type 2, controlled ([place])    no insulin  . Hyperlipidemia    statin intolerant  . Hypertension   . Tobacco abuse, in remission    30 pack years stopped in 1971    Past Surgical History:  Procedure Laterality Date  . APPENDECTOMY    . CARPAL TUNNEL RELEASE     surgery left 05/05/2001  . COLONOSCOPY N/A 10/01/2017   Procedure: COLONOSCOPY;  Surgeon: Jackquline Denmark, MD;  Location: [facility] ENDOSCOPY;  Service: Endoscopy;  Laterality: N/A;  . KNEE ARTHROSCOPY     bilaterial; unknown associated surgical procedure  . SHOULDER SURGERY     Right x2; third procedure on left  . TEE WITHOUT CARDIOVERSION N/A 07/29/2017   Procedure: TRANSESOPHAGEAL ECHOCARDIOGRAM (TEE) WITH PROPOFOL;  Surgeon: Satira Sark, MD;  Location: [facility] ENDO SUITE;  Service: Cardiovascular;  Laterality: N/A;  . TONSILLECTOMY    . TRANSURETHRAL INCISION OF PROSTATE     resection of the prostate     Home Medications:  Prior to Admission medications   Medication Sig Start Date End Date Taking? Authorizing Provider  acetaminophen (TYLENOL) 325 MG tablet Take 325-650 mg by mouth every 6 (six) hours as needed (for pain).    Yes [provider]  albuterol (PROVENTIL HFA;VENTOLIN HFA)  108 (90 Base) MCG/ACT inhaler Inhale 2 puffs into the lungs every 4 (four) hours as needed for wheezing or shortness of breath.    Yes [provider]  amLODipine (NORVASC) 5 MG tablet Take 1 tablet (5 mg total) by mouth daily. 02/16/18 11/06/18 Yes BranchAlphonse Guild, MD  aspirin 81 MG tablet Take 1 tablet (81 mg total) by mouth daily. 10/03/17  Yes Irene Pap N, DO  betaxolol (BETOPTIC-S) 0.5 % ophthalmic suspension Place 1 drop into both eyes every morning.  12/15/15  Yes [provider]  bimatoprost (LUMIGAN) 0.01 % SOLN Place 1 drop into both eyes every morning.    Yes [provider]  carvedilol (COREG) 12.5 MG tablet Take 12.5 mg by mouth 2 (two) times daily with a meal.  09/28/12  Yes [provider]  Cranberry 500 MG CAPS Take 500 mg by mouth every evening.    Yes [provider]  dorzolamide (TRUSOPT) 2 % ophthalmic solution Place 1 drop into both eyes 2 (two) times daily.    Yes [provider]  fexofenadine (ALLEGRA) 180 MG tablet Take 180 mg by mouth daily.   Yes [provider]  Fluticasone-Umeclidin-Vilant (TRELEGY ELLIPTA) 100-62.5-25 MCG/INH AEPB Inhale 1 puff into the lungs daily.   Yes [provider]  glipiZIDE (GLUCOTROL XL) 5 MG 24 hr tablet Take 5 mg by mouth daily with breakfast.    Yes [provider]  ipratropium-albuterol (DUONEB) 0.5-2.5 (3) MG/3ML SOLN Take 3 mLs by nebulization every 4 (four) hours as needed. 10/03/17  Yes Hall, Carole N, DO  Netarsudil Dimesylate (RHOPRESSA) 0.02 % SOLN Place 1 drop into the left eye at bedtime.    Yes [provider]  oxybutynin (DITROPAN) 5 MG tablet Take 5 mg by mouth daily.    Yes [provider]  pantoprazole (PROTONIX) 40 MG tablet Take 1 tablet (40 mg total) by mouth 2 (two) times daily. 10/03/17  Yes Kayleen Memos, DO  pilocarpine (PILOCAR) 2 % ophthalmic solution Place 1 drop into the left eye 4 (four) times daily.    Yes [provider]  polyethylene glycol (MIRALAX / GLYCOLAX) packet Take 17 g by mouth daily. 10/04/17  Yes Irene Pap N, DO  potassium chloride (K-DUR) 10 MEQ tablet Take 10 mEq by mouth daily. ONLY WHILE TAKING LASIX   Yes [provider]  sitaGLIPtin (JANUVIA) 100 MG tablet Take 50 mg by mouth daily.   Yes [provider]  tamsulosin (FLOMAX) 0.4 MG CAPS capsule Take 0.4 mg by mouth daily.   Yes [provider]  torsemide (DEMADEX) 10 MG tablet Take 10 mg by mouth daily.   Yes [provider]    Inpatient Medications: Scheduled Meds: . amLODipine  5 mg Oral  Daily  . aspirin  81 mg Oral Daily  . betaxolol  1 drop Both Eyes Daily  . carvedilol  12.5 mg Oral BID WC  . dorzolamide  1 drop Both Eyes BID  . enoxaparin (LOVENOX) injection  30 mg Subcutaneous Q24H  . umeclidinium bromide  1 puff Inhalation Daily   And  . fluticasone furoate-vilanterol  1 puff Inhalation Daily  . insulin aspart  0-9 Units Subcutaneous Q4H  . loratadine  10 mg Oral Daily  . Netarsudil Dimesylate  1 drop Left Eye QHS  . oxybutynin  5 mg Oral Daily  . pantoprazole  40 mg Oral BID  . polyethylene glycol  17 g Oral Daily  . potassium chloride SA  10 mEq Oral Daily  . tamsulosin  0.4 mg Oral Daily  . torsemide  10 mg Oral Daily   Continuous Infusions:  PRN Meds: acetaminophen **OR** acetaminophen, ipratropium-albuterol, morphine injection, nitroGLYCERIN, ondansetron **OR** ondansetron (ZOFRAN) IV  Allergies:    Allergies  Allergen Reactions  . Codeine Other (See Comments)    Reaction not recalled  . Ezetimibe-Simvastatin Other (See Comments)    Muscle pain  . Naproxen Other (See Comments)    Reaction not recalled  . Niacin Other (See Comments)    Reaction not recalled  . Nsaids Other (See Comments)    Reaction not recalled  . Pravastatin Sodium Other (See Comments)    Muscle pain    Social History:   Social History   Socioeconomic History  . Marital status: Married    Spouse name: Not on file  . Number of children: 4  . Years of education: Not on file  . Highest education level: Not on file  Occupational History  . Occupation: retired    Fish farm manager: RETIRED    Comment: [employer]  Social Needs  . Financial resource strain: Not on file  . Food insecurity:    Worry: Not on file    Inability: Not on file  . Transportation needs:    Medical: Not on file    Non-medical: Not on file  Tobacco Use  . Smoking status: Former Smoker    Packs/day: 1.00    Years: 40.00    Pack years: 40.00    Types: Cigarettes    Last attempt to quit: 10/29/1969     Years since quitting: 49.0  . Smokeless tobacco: Never Used  Substance and Sexual Activity  . Alcohol use: No    Alcohol/week: 0.0 standard drinks  . Drug use: No  . Sexual activity: Not on file  Lifestyle  . Physical activity:    Days per week: Not on file    Minutes per session: Not on file  . Stress: Not on file  Relationships  . Social connections:    Talks on phone: Not on file    Gets together: Not on file    Attends religious service: Not on file    Active member of club or organization: Not on file    Attends meetings of clubs or organizations: Not on file    Relationship status: Not on file  . Intimate partner violence:    Fear of current or ex partner: Not on file    Emotionally abused: Not on file    Physically abused: Not on file    Forced sexual activity: Not on file  Other Topics Concern  . Not on file  Social History Narrative  . Not on file    Family History:    Family History  Problem Relation Age of Onset  . Multiple sclerosis Mother   . Heart disease Father   . Peripheral vascular disease Sister      ROS:  Please see the history of present illness.   All other ROS reviewed and negative.     Physical Exam/Data:   Vitals:   11/06/18 2019 11/06/18 2115 11/07/18 0404 11/07/18 0811  BP:  (!) 155/69 (!) 142/78   Pulse:  74 66   Resp:   16   Temp:  98.4 F (36.9 C) 97.9 F (36.6 C)   TempSrc:  Oral Oral   SpO2: 94% 91% 94% 95%  Weight:      Height:  Intake/Output Summary (Last 24 hours) at 11/07/2018 0826 Last data filed at 11/07/2018 0100 Gross per 24 hour  Intake 240 ml  Output 200 ml  Net 40 ml   Last 3 Weights 11/06/2018 03/13/2018 02/23/2018  Weight (lbs) 200 lb 196 lb 192 lb  Weight (kg) 90.719 kg 88.905 kg 87.091 kg     Body mass index is 31.8 kg/m.  General:  Well nourished, well developed, in no acute distress HEENT: normal Lymph: no adenopathy Neck: no JVD Endocrine:  No thryomegaly Vascular: No carotid bruits; FA  pulses 2+ bilaterally without bruits  Cardiac:  normal S1, S2; RRR; 2/6 systolic murmur Lungs:  clear to auscultation bilaterally, no wheezing, rhonchi or rales  Abd: soft, nontender, no hepatomegaly  Ext: no edema Musculoskeletal:  No deformities, BUE and BLE strength normal and equal Skin: warm and dry  Neuro:  CNs 2-12 intact, no focal abnormalities noted Psych:  Normal affect   EKG:  The EKG was personally reviewed and demonstrates:  NSR, NSST changes, PVC and PAC  Relevant CV Studies: Echo May 2019- Study Conclusions  - Left ventricle: The cavity size was normal. Wall thickness was   increased in a pattern of mild LVH. Systolic function was normal.   The estimated ejection fraction was in the range of 55% to 60%.   Wall motion was normal; there were no regional wall motion   abnormalities. Doppler parameters are consistent with abnormal   left ventricular relaxation (grade 1 diastolic dysfunction). - Aortic valve: Moderately calcified annulus. Probably trileaflet;   mildly calcified leaflets. Valve mobility was restricted. There   was moderate stenosis. There was mild regurgitation. Mean   gradient (S): 12 mm Hg. VTI ratio of LVOT to aortic valve: 0.37.   Valve area (VTI): 1.27 cm^2. - Mitral valve: Mildly calcified annulus. Mildly thickened, mildly   calcified leaflets. There was mild regurgitation. - Right atrium: Central venous pressure (est): 3 mm Hg. - Atrial septum: No defect or patent foramen ovale was identified. - Tricuspid valve: There was trivial regurgitation. - Pulmonary arteries: PA peak pressure: 19 mm Hg (S). - Pericardium, extracardiac: There was no pericardial effusion.  Impressions:  - No definitive valvular vegetations noted.  Laboratory Data:  Chemistry Recent Labs  Lab 11/06/18 1243  NA 141  K 3.7  CL 107  CO2 27  GLUCOSE 170*  BUN 22  CREATININE 1.89*  CALCIUM 9.1  GFRNONAA 31*  GFRAA 36*  ANIONGAP 7    No results for input(s):  PROT, ALBUMIN, AST, ALT, ALKPHOS, BILITOT in the last 168 hours. Hematology Recent Labs  Lab 11/06/18 1240  WBC 6.1  RBC 5.28  HGB 15.7  HCT 48.0  MCV 90.9  MCH 29.7  MCHC 32.7  RDW 15.1  PLT 169   Cardiac Enzymes Recent Labs  Lab 11/06/18 1243 11/06/18 1608 11/06/18 2150 11/07/18 0448  TROPONINI 0.03* <0.03 0.03* <0.03   No results for input(s): TROPIPOC in the last 168 hours.  BNPNo results for input(s): BNP, PROBNP in the last 168 hours.  DDimer  Recent Labs  Lab 11/06/18 1608  DDIMER 1.71*    Radiology/Studies:  Nm Pulmonary Perfusion  Result Date: 11/06/2018 CLINICAL DATA:  85 year old male with chest pain and shortness of breath. EXAM: NUCLEAR MEDICINE PERFUSION LUNG SCAN TECHNIQUE: Perfusion images were obtained in multiple projections after intravenous injection of radiopharmaceutical. Ventilation scans intentionally deferred if perfusion scan and chest x-ray adequate for interpretation during COVID 19 epidemic. RADIOPHARMACEUTICALS:  1.5 mCi Tc-40m MAA IV  COMPARISON:  Portable chest earlier today. FINDINGS: Homogeneous perfusion radiotracer activity in both lungs. No perfusion defect to suggest acute pulmonary embolus. Ventilation imaging was deferred. IMPRESSION: Normal perfusion imaging, no evidence of acute pulmonary embolus. Electronically Signed   By: Genevie Ann M.D.   On: 11/06/2018 19:22   Dg Chest Port 1 View  Result Date: 11/06/2018 CLINICAL DATA:  Chest pain short of breath EXAM: PORTABLE CHEST 1 VIEW COMPARISON:  07/29/2017 FINDINGS: Hypoventilation with mild left lower lobe atelectasis. Negative for heart failure or pneumonia. No pleural effusion. IMPRESSION: Mild left lower lobe atelectasis. Electronically Signed   By: Franchot Gallo M.D.   On: 11/06/2018 13:08    Assessment and Plan:   Chest pain with a high risk of ACS- Troponin so far do not suggest an acute MI. Per notes from Dr Harl Bowie the plan would be for continued medical Rx.   Moderate AS-  Moderate in May 2019-  CRI-3 He is almost stage 4 with a GFR of 31  COPD- Moderate to sever by history- on inhalers  HTN- Decent control with Coreg and Norvasc   For questions or updates, please contact [place] Please consult [email or web address] for contact info under    Signed, Kerin Ransom, PA-C  11/07/2018 8:26 AM    Attending note:  Patient seen and examined.  I reviewed extensive records and discussed the case with Anthony Murray.  Anthony Murray is a patient of Dr. Harl Bowie, evaluated by telehealth encounter yesterday.  He was told to come to the ER for further evaluation of recent onset chest discomfort concerning for angina.  His history includes moderate CAD by cardiac catheterization in 2016 as well as reportedly moderate to severe aortic stenosis that was last imaged in May 2019.  In talking with the patient today, he states that he has actually been having intermittent chest discomfort for several months, although more frequent since Friday.  He reports compliance with his medications.  He has not used any nitroglycerin with these episodes at home.  On examination this morning he reports no chest discomfort and appears comfortable at rest.  He is afebrile.  Systolic blood pressure in the 140s to 150s, heart rate in the 70s in sinus rhythm by telemetry which I personally reviewed.  Lungs are clear without labored breathing.  Cardiac exam reveals RRR with 2/6 systolic murmur, no gallop.  He has no peripheral edema.  Lab work shows essentially normal troponin I levels, flat at 0.03.  Potassium 3.7, BUN 22, creatinine 1.89, hemoglobin 15.7, platelets 169 or 1.71, SARS coronavirus 2 negative.  Chest x-ray reports mild left lower lobe atelectasis.  VQ scan was reported as normal.  I personally reviewed his ECG which shows sinus rhythm with PAC and PVC, leftward axis, nonspecific ST-T changes.  Patient presents with suspected angina symptoms although no clear evidence of ACS, no acute  ST segment changes by ECG.  He has a known history of moderate CAD as of 2016 and also reportedly moderate to severe aortic stenosis.  CKD stage III-IV also noted with creatinine 1.8-1.9 range.  At this point would anticipate medical therapy.  He needs a follow-up echocardiogram which will be ordered for reassessment of LVEF and degree of aortic stenosis - this could potentially be worked up as an outpatient for TAVR if the situation has progressed.  Otherwise continue aspirin, Coreg, Norvasc, Demadex, and potassium supplements.  We will add Imdur 30 mg once daily. He reports allergies to statins and Zetia.  Mikeal Hawthorne  Delman Kitten, M.D., F.A.C.C.

## 2018-11-07 NOTE — Plan of Care (Signed)

## 2018-11-07 NOTE — Discharge Summary (Signed)
Physician Discharge Summary  Anthony Murray NTI:[id number] DOB: 18-Jan-1928 DOA: 11/06/2018  PCP: [name], MD  Admit date: 11/06/2018 Discharge date: 11/07/2018  Time spent: 35 minutes  Recommendations for Outpatient Follow-up:  1. Repeat basic metabolic panel to follow electrolytes and renal function 2. Reassess blood pressure and adjust antihypertensive regimen as needed. 3. Outpatient follow-up with cardiology service to further adjust medical management for borderline coronary artery disease and aortic stenosis.   Discharge Diagnoses:  Active Problems:   Essential hypertension   CAD (coronary artery disease), native coronary artery   Type 2 diabetes mellitus with complications (HCC)   CKD (chronic kidney disease), stage III (HCC)   History of CVA (cerebrovascular accident)   Carotid artery disease (HCC)   History of endocarditis   History of lower GI bleeding   Chest pain with high risk of acute coronary syndrome   Discharge Condition: Stable and improved.  Patient denies chest pain and shortness of breath at discharge.  Discharged home with instruction to follow-up with PCP and cardiology service as an outpatient.  Diet recommendation: Heart healthy modified carbohydrate diet.  Filed Weights   11/06/18 1235  Weight: 90.7 kg    History of present illness:  As per H&P written by Dr. Denton Brick on 11/06/2018 85 y.o. male with medical history significant for CAD, aortic valve stenosis, hypertension, DM 2, peripheral vascular disease, CKD 3, with complaints of chest pain that started 3 days ago.  Chest pain radiates down his left arm.  Reports chronic difficulty breathing which he uses his inhalers but this has not changed.  He has had multiple episodes since onset.  Chest pain is worse with exertion.,  Relieved by rest.   Patient called his cardiologist, who recommended ED evaluation.   ED Course: 130s to 160s, otherwise unremarkable vitals.  Heart rate 60s to 70s.  O2 sats  greater than 91% on room air.  Unremarkable CBC, creatinine about baseline at 1.89.  Mild bump in troponin to 0.03.  EKG sinus rhythm, unchanged T wave abnormalities.  Cardiology was consulted in the ED-we will troponins, during age and renal insufficiency, plan for medical therapy, to consider invasive evaluation if large troponin rise, check d-dimer and n.p.o. from midnight. DDimer was obtained in the Ed and elevated at 1.7.    Hospital Course:  1-Chest pain -Patient with prior history of coronary artery disease and heart score of 4-5 -Very mild elevation of troponin during hospitalization with subsequent normalization. -No acute ischemic changes on EKG or abnormalities on telemetry. -Patient denies shortness of breath and chest pain at this time. -2D echo reassuring with preserved ejection fraction and no wall motion abnormalities. -Cardiology has recommended initiation of Imdur; and continue the use B-blocker and ASA. -Patient follow-up with cardiology service will be arranged after discharge.  2-essential hypertension -Stable and well-controlled -Continue Coreg, Norvasc and Demadex. -Heart healthy diet has been recommended.  3-moderate aortic stenosis -Advised to check his weight on daily basis -Continue blood pressure control -Continue outpatient follow-up with cardiology service -Echo demonstrating stable valvular disorder at this time and normal diastolic parameters of his left ventricular function.Jani Files disease is stage III -Stable and at baseline -Advised to maintain adequate hydration.  5-type 2 diabetes mellitus with nephropathy -Continue modified carbohydrate diet -Continue [place] libertine and glipizide.  6-BPH -Continue Flomax.  Procedures:  See below for x-ray reports:  V/Q scan: Normal study.  No signs of pulmonary embolism appreciated.  2D echo: The left ventricle has normal systolic function  with an ejection fraction of 60-65%. The cavity size was  normal. There is moderately increased left ventricular wall thickness. Left ventricular diastolic parameters were normal. No evidence of left ventricular regional wall motion abnormalities.  2. The right ventricle has normal systolic function. The cavity was normal. There is no increase in right ventricular wall thickness.  3. The aortic valve is tricuspid. Moderate calcification of the aortic valve. Moderate aortic annular calcification noted. There is mild aortic stenosis basedon gradients and calculated valve area of 1.51 cm2 by planimetry. The dimentionless index  suggests the degree of stenosis could be at least moderate however.  4. The mitral valve is degenerative. Mild calcification of the mitral valve leaflet. There is moderate mitral annular calcification present.  5. The tricuspid valve is grossly normal.  6. The aortic root is normal in size and structure.  Consultations:  Audiology service  Discharge Exam: Vitals:   11/07/18 0811 11/07/18 1500  BP:  (!) 148/68  Pulse:  78  Resp:  16  Temp:  98 F (36.7 C)  SpO2: 95% 96%    General: Afebrile, no chest pain, no nausea, no vomiting.  Reports no shortness of breath currently. Cardiovascular: Rate control, S1 and S2; 2 out of 6 systolic murmur.  No rubs, no gallops. Respiratory: Clear to auscultation bilaterally; normal respiratory effort. Abdomen: Soft, nontender, positive bowel sounds Extremities: No cyanosis, no clubbing.  No lower extremity edema appreciated.  Discharge Instructions   Discharge Instructions    (HEART FAILURE PATIENTS) Call MD:  Anytime you have any of the following symptoms: 1) 3 pound weight gain in 24 hours or 5 pounds in 1 week 2) shortness of breath, with or without a dry hacking cough 3) swelling in the hands, feet or stomach 4) if you have to sleep on extra pillows at night in order to breathe.   Complete by:  As directed    Diet - low sodium heart healthy   Complete by:  As directed     Discharge instructions   Complete by:  As directed    Take medications as prescribed Follow heart healthy diet Check your weight on daily basis Follow-up with cardiology service as instructed (office will contact you with appointment details). Follow-up with PCP in 2 weeks.     Allergies as of 11/07/2018      Reactions   Codeine Other (See Comments)   Reaction not recalled   Ezetimibe-simvastatin Other (See Comments)   Muscle pain   Naproxen Other (See Comments)   Reaction not recalled   Niacin Other (See Comments)   Reaction not recalled   Nsaids Other (See Comments)   Reaction not recalled   Pravastatin Sodium Other (See Comments)   Muscle pain      Medication List    TAKE these medications   acetaminophen 325 MG tablet Commonly known as:  TYLENOL Take 325-650 mg by mouth every 6 (six) hours as needed (for pain).   albuterol 108 (90 Base) MCG/ACT inhaler Commonly known as:  VENTOLIN HFA Inhale 2 puffs into the lungs every 4 (four) hours as needed for wheezing or shortness of breath.   amLODipine 5 MG tablet Commonly known as:  NORVASC Take 1 tablet (5 mg total) by mouth daily.   aspirin 81 MG tablet Take 1 tablet (81 mg total) by mouth daily.   betaxolol 0.5 % ophthalmic suspension Commonly known as:  BETOPTIC-S Place 1 drop into both eyes every morning.   bimatoprost 0.01 % Soln Commonly  known as:  LUMIGAN Place 1 drop into both eyes every morning.   carvedilol 12.5 MG tablet Commonly known as:  COREG Take 12.5 mg by mouth 2 (two) times daily with a meal.   Cranberry 500 MG Caps Take 500 mg by mouth every evening.   dorzolamide 2 % ophthalmic solution Commonly known as:  TRUSOPT Place 1 drop into both eyes 2 (two) times daily.   fexofenadine 180 MG tablet Commonly known as:  ALLEGRA Take 180 mg by mouth daily.   glipiZIDE 5 MG 24 hr tablet Commonly known as:  GLUCOTROL XL Take 5 mg by mouth daily with breakfast.   ipratropium-albuterol 0.5-2.5  (3) MG/3ML Soln Commonly known as:  DUONEB Take 3 mLs by nebulization every 4 (four) hours as needed.   isosorbide mononitrate 30 MG 24 hr tablet Commonly known as:  IMDUR Take 1 tablet (30 mg total) by mouth daily. Start taking on:  Nov 08, 2018   oxybutynin 5 MG tablet Commonly known as:  DITROPAN Take 5 mg by mouth daily.   pantoprazole 40 MG tablet Commonly known as:  PROTONIX Take 1 tablet (40 mg total) by mouth 2 (two) times daily.   pilocarpine 2 % ophthalmic solution Commonly known as:  PILOCAR Place 1 drop into the left eye 4 (four) times daily.   polyethylene glycol 17 g packet Commonly known as:  MIRALAX / GLYCOLAX Take 17 g by mouth daily.   potassium chloride 10 MEQ tablet Commonly known as:  K-DUR Take 10 mEq by mouth daily. ONLY WHILE TAKING LASIX   Rhopressa 0.02 % Soln Generic drug:  Netarsudil Dimesylate Place 1 drop into the left eye at bedtime.   sitaGLIPtin 100 MG tablet Commonly known as:  JANUVIA Take 50 mg by mouth daily.   tamsulosin 0.4 MG Caps capsule Commonly known as:  FLOMAX Take 0.4 mg by mouth daily.   torsemide 10 MG tablet Commonly known as:  DEMADEX Take 10 mg by mouth daily.   Trelegy Ellipta 100-62.5-25 MCG/INH Aepb Generic drug:  Fluticasone-Umeclidin-Vilant Inhale 1 puff into the lungs daily.      Allergies  Allergen Reactions  . Codeine Other (See Comments)    Reaction not recalled  . Ezetimibe-Simvastatin Other (See Comments)    Muscle pain  . Naproxen Other (See Comments)    Reaction not recalled  . Niacin Other (See Comments)    Reaction not recalled  . Nsaids Other (See Comments)    Reaction not recalled  . Pravastatin Sodium Other (See Comments)    Muscle pain   Follow-up Information    [name], MD. Schedule an appointment as soon as possible for a visit in 2 week(s).   Specialty:  Internal Medicine Contact information: [place] [facility] [address] [phone]        [name], MD .   Specialty:  Cardiology Contact information: [place] [address] [phone]           The results of significant diagnostics from this hospitalization (including imaging, microbiology, ancillary and laboratory) are listed below for reference.    Significant Diagnostic Studies: Nm Pulmonary Perfusion  Result Date: 11/06/2018 CLINICAL DATA:  85 year old male with chest pain and shortness of breath. EXAM: NUCLEAR MEDICINE PERFUSION LUNG SCAN TECHNIQUE: Perfusion images were obtained in multiple projections after intravenous injection of radiopharmaceutical. Ventilation scans intentionally deferred if perfusion scan and chest x-ray adequate for interpretation during COVID 19 epidemic. RADIOPHARMACEUTICALS:  1.5 mCi Tc-48m MAA IV COMPARISON:  Portable chest earlier today. FINDINGS: Homogeneous perfusion radiotracer activity in both lungs. No perfusion defect to suggest acute pulmonary embolus. Ventilation imaging was deferred. IMPRESSION: Normal perfusion imaging, no evidence of acute pulmonary embolus. Electronically Signed   By: Genevie Ann M.D.   On: 11/06/2018 19:22   Dg Chest Port 1 View  Result Date: 11/06/2018 CLINICAL DATA:  Chest pain short of breath EXAM: PORTABLE CHEST 1 VIEW COMPARISON:  07/29/2017 FINDINGS: Hypoventilation with mild left lower lobe atelectasis. Negative for heart failure or pneumonia. No pleural effusion. IMPRESSION: Mild left lower lobe atelectasis. Electronically Signed   By: Franchot Gallo M.D.   On: 11/06/2018 13:08    Microbiology: Recent Results (from the past 240 hour(s))  SARS Coronavirus 2 (CEPHEID - Performed in [place] hospital lab), Hosp Order     Status: None   Collection Time: 11/06/18 12:50 PM  Result Value Ref Range Status   SARS Coronavirus 2 NEGATIVE NEGATIVE Final    Comment: (NOTE) If result is NEGATIVE SARS-CoV-2 target nucleic acids are NOT DETECTED. The SARS-CoV-2 RNA is generally  detectable in upper and lower  respiratory specimens during the acute phase of infection. The lowest  concentration of SARS-CoV-2 viral copies this assay can detect is 250  copies / mL. A negative result does not preclude SARS-CoV-2 infection  and should not be used as the sole basis for treatment or other  patient management decisions.  A negative result may occur with  improper specimen collection / handling, submission of specimen other  than nasopharyngeal swab, presence of viral mutation(s) within the  areas targeted by this assay, and inadequate number of viral copies  (<250 copies / mL). A negative result must be combined with clinical  observations, patient history, and epidemiological information. If result is POSITIVE SARS-CoV-2 target nucleic acids are DETECTED. The SARS-CoV-2 RNA is generally detectable in upper and lower  respiratory specimens dur ing the acute phase of infection.  Positive  results are indicative of active infection with SARS-CoV-2.  Clinical  correlation with patient history and other diagnostic information is  necessary to determine patient infection status.  Positive results do  not rule out bacterial infection or co-infection with other viruses. If result is PRESUMPTIVE POSTIVE SARS-CoV-2 nucleic acids MAY BE PRESENT.   A presumptive positive result was obtained on the submitted specimen  and confirmed on repeat testing.  While 2019 novel coronavirus  (SARS-CoV-2) nucleic acids may be present in the submitted sample  additional confirmatory testing may be necessary for epidemiological  and / or clinical management purposes  to differentiate between  SARS-CoV-2 and other Sarbecovirus currently known to infect humans.  If clinically indicated additional testing with an alternate test  methodology [phone]) is advised. The SARS-CoV-2 RNA is generally  detectable in upper and lower respiratory sp ecimens during the acute  phase of infection. The  expected result is Negative. Fact Sheet for Patients:  StrictlyIdeas.no Fact Sheet for Healthcare Providers: BankingDealers.co.za This test is not yet approved or cleared by the [place] FDA and has been authorized for detection and/or diagnosis of SARS-CoV-2 by FDA under an Emergency Use Authorization (EUA).  This EUA will remain in effect (meaning this test can be used) for the duration of the COVID-19 declaration under Section 564(b)(1) of the Act, 21 U.S.C. section 360bbb-3(b)(1), unless the authorization is terminated or revoked sooner. Performed at [facility], [address] [place], [place] [address]      Labs:  Basic Metabolic Panel: Recent Labs  Lab 11/06/18 1243  NA 141  K 3.7  CL 107  CO2 27  GLUCOSE 170*  BUN 22  CREATININE 1.89*  CALCIUM 9.1    CBC: Recent Labs  Lab 11/06/18 1240  WBC 6.1  NEUTROABS 3.5  HGB 15.7  HCT 48.0  MCV 90.9  PLT 169   Cardiac Enzymes: Recent Labs  Lab 11/06/18 1243 11/06/18 1608 11/06/18 2150 11/07/18 0448  TROPONINI 0.03* <0.03 0.03* <0.03    CBG: Recent Labs  Lab 11/07/18 0104 11/07/18 0404 11/07/18 0728 11/07/18 1117 11/07/18 1608  GLUCAP 118* 139* 137* 138* 158*     Signed:  Barton Dubois MD.  [facility] 11/07/2018, 4:30 PM

## 2018-11-07 NOTE — Progress Notes (Signed)
Pt has glasses, [place] (money in place and pt counted), keys, and cloths. In own W/C pending D/C.

## 2018-11-07 NOTE — Progress Notes (Signed)
*  PRELIMINARY RESULTS* Echocardiogram 2D Echocardiogram has been performed.  Anthony Murray 11/07/2018, 3:06 PM

## 2018-11-08 DIAGNOSIS — R809 Proteinuria, unspecified: Secondary | ICD-10-CM | POA: Diagnosis not present

## 2018-11-08 DIAGNOSIS — N400 Enlarged prostate without lower urinary tract symptoms: Secondary | ICD-10-CM | POA: Diagnosis not present

## 2018-11-08 DIAGNOSIS — R3911 Hesitancy of micturition: Secondary | ICD-10-CM | POA: Diagnosis not present

## 2018-11-08 DIAGNOSIS — I10 Essential (primary) hypertension: Secondary | ICD-10-CM | POA: Diagnosis not present

## 2018-11-08 DIAGNOSIS — R600 Localized edema: Secondary | ICD-10-CM | POA: Diagnosis not present

## 2018-11-08 DIAGNOSIS — N183 Chronic kidney disease, stage 3 (moderate): Secondary | ICD-10-CM | POA: Diagnosis not present

## 2018-11-18 ENCOUNTER — Telehealth: Payer: Self-pay | Admitting: Physician Assistant

## 2018-11-18 NOTE — Telephone Encounter (Signed)
Pt daughter called stating her father is having chest pain. Was hospitalized [facility] last week for chest pain. Trops 0.03 x 3, no EKG changes. Medications were adjusted - 30 mg imdur was started. Sounds like CP never completely resolved.  CP 12-7AM 9/10 at worst, generally 3-4/10. Unknown length of time. This is a difficult phone call - daughter is not with him and apparently I am unable to speak directly with the patient. Daughter states she does not think he has any other associated symptoms. She does not know his blood pressure.   He is taking ASA, coreg, norvasc and the 30 mg imdur that was started last week.  Was able to get a BP after his medications today, which was 104/58.  I asked repeatedly if I could speak with the patient directly. The daughter states that me doing that would keep her out of the loop. I offered to call her back. I asked her if the pain he was reporting was the same or worse than during his hospitalization last week, she wasn't sure. Given his above pressure, I suggested taking imdur twice daily - 30 mg BID to see if that would help his chest pain. I stated that if the chest pain was different or worse than last week, they needed to go to the ER. She expressed understanding.  I will send this note to Dr. Harl Bowie.

## 2018-11-20 ENCOUNTER — Encounter: Payer: Self-pay | Admitting: *Deleted

## 2018-11-20 DIAGNOSIS — I350 Nonrheumatic aortic (valve) stenosis: Secondary | ICD-10-CM | POA: Diagnosis not present

## 2018-11-20 DIAGNOSIS — I129 Hypertensive chronic kidney disease with stage 1 through stage 4 chronic kidney disease, or unspecified chronic kidney disease: Secondary | ICD-10-CM | POA: Diagnosis not present

## 2018-11-20 DIAGNOSIS — R079 Chest pain, unspecified: Secondary | ICD-10-CM | POA: Diagnosis not present

## 2018-11-20 DIAGNOSIS — S72012D Unspecified intracapsular fracture of left femur, subsequent encounter for closed fracture with routine healing: Secondary | ICD-10-CM | POA: Diagnosis not present

## 2018-11-20 DIAGNOSIS — M19042 Primary osteoarthritis, left hand: Secondary | ICD-10-CM | POA: Diagnosis not present

## 2018-11-20 DIAGNOSIS — R944 Abnormal results of kidney function studies: Secondary | ICD-10-CM | POA: Diagnosis not present

## 2018-11-20 DIAGNOSIS — E1165 Type 2 diabetes mellitus with hyperglycemia: Secondary | ICD-10-CM | POA: Diagnosis not present

## 2018-11-20 DIAGNOSIS — I6529 Occlusion and stenosis of unspecified carotid artery: Secondary | ICD-10-CM | POA: Diagnosis not present

## 2018-11-20 DIAGNOSIS — R600 Localized edema: Secondary | ICD-10-CM | POA: Diagnosis not present

## 2018-11-20 DIAGNOSIS — M19041 Primary osteoarthritis, right hand: Secondary | ICD-10-CM | POA: Diagnosis not present

## 2018-11-20 DIAGNOSIS — Z6830 Body mass index (BMI) 30.0-30.9, adult: Secondary | ICD-10-CM | POA: Diagnosis not present

## 2018-11-20 DIAGNOSIS — I10 Essential (primary) hypertension: Secondary | ICD-10-CM | POA: Diagnosis not present

## 2018-11-20 DIAGNOSIS — R2689 Other abnormalities of gait and mobility: Secondary | ICD-10-CM | POA: Diagnosis not present

## 2018-11-20 DIAGNOSIS — E782 Mixed hyperlipidemia: Secondary | ICD-10-CM | POA: Diagnosis not present

## 2018-11-20 DIAGNOSIS — J449 Chronic obstructive pulmonary disease, unspecified: Secondary | ICD-10-CM | POA: Diagnosis not present

## 2018-11-20 DIAGNOSIS — R4189 Other symptoms and signs involving cognitive functions and awareness: Secondary | ICD-10-CM | POA: Diagnosis not present

## 2018-11-20 DIAGNOSIS — R809 Proteinuria, unspecified: Secondary | ICD-10-CM | POA: Diagnosis not present

## 2018-11-20 DIAGNOSIS — R339 Retention of urine, unspecified: Secondary | ICD-10-CM | POA: Diagnosis not present

## 2018-11-20 DIAGNOSIS — S72099A Other fracture of head and neck of unspecified femur, initial encounter for closed fracture: Secondary | ICD-10-CM | POA: Diagnosis not present

## 2018-11-20 DIAGNOSIS — N183 Chronic kidney disease, stage 3 (moderate): Secondary | ICD-10-CM | POA: Diagnosis not present

## 2018-11-20 DIAGNOSIS — E1151 Type 2 diabetes mellitus with diabetic peripheral angiopathy without gangrene: Secondary | ICD-10-CM | POA: Diagnosis not present

## 2018-11-20 DIAGNOSIS — I25119 Atherosclerotic heart disease of native coronary artery with unspecified angina pectoris: Secondary | ICD-10-CM | POA: Diagnosis not present

## 2018-11-20 NOTE — Telephone Encounter (Signed)
Can we check on this patient, did he increase his imdur and how are the symptoms doing   Zandra Abts MD

## 2018-11-20 NOTE — Telephone Encounter (Signed)
Spoke with wife because she states pt is hard of hearing. She states that pt did increase Imdur and is still have chest pain. She reports that pt has memory loss. Pt was having chest pain on yesterday and EMS was called. Pt refused to be transported to hospital. Pt was seen by PCP on today and had lab work done. Will request labs from PCP.

## 2018-11-21 NOTE — Telephone Encounter (Signed)
If taking imdur 30mg  bid and still having pain then increase to 45mg  bid and update [place] on symptoms later in the week   J Zephyr Ridley MD

## 2018-11-22 MED ORDER — ISOSORBIDE MONONITRATE ER 30 MG PO TB24: 45.0000 mg | ORAL_TABLET | Freq: Two times a day (BID) | ORAL | 3 refills | Status: DC

## 2018-11-22 NOTE — Telephone Encounter (Signed)
Follow response with medication change. We hope we can control symptoms with medications alone, a cath would be very risky for his kidney function. Certaintly if symptoms significnatly progress it may be something we have to consider. Can he update [place] Friday   Zandra Abts MD

## 2018-11-22 NOTE — Telephone Encounter (Signed)
Daughter Marcie Bal notified and voiced understanding

## 2018-11-22 NOTE — Telephone Encounter (Signed)
Spoke with pt who reports that he is still having chest pain. Spoke with daughter Marcie Bal who prepares pt's medications. She states that he is currently taking Imdur 30 mg two times daily. She reports BP today of 104/52. Informed her of increase of Imdur to 45 mg two times daily. And to continue to monitor BP. Please advise.

## 2018-11-22 NOTE — Addendum Note (Signed)
Addended by: Levonne Hubert on: 11/22/2018 11:12 AM   Modules accepted: Orders

## 2018-11-23 ENCOUNTER — Telehealth: Payer: Self-pay | Admitting: Cardiology

## 2018-11-23 NOTE — Telephone Encounter (Signed)
If that severe needs to come to hospital. We have been hesitant to do a cath due to his renal function but sounds like his symptoms are progressing and not improving with medications and its something we may have to reconsider   J Satia Winger MD

## 2018-11-23 NOTE — Telephone Encounter (Signed)
I spoke with daughter Marcie Bal, she will go over to their house and make sure he has taken his Imdur increased dose.We will touch base tomorrow

## 2018-11-23 NOTE — Telephone Encounter (Signed)
Patient's wife says his Imdur was increased to 45 mg BID yesterday, but, he has not increased the dose yet.As our front office staff was scheduling an apt for them, the wife told her he had 9/10 CP with radiation to his left arm.Denies any other sx's Pain come and last several minutes. No pain now    I will FYI Dr.Branch

## 2018-11-23 NOTE — Telephone Encounter (Signed)
Patient having active chest pain / pain radiating down left arm

## 2018-11-24 MED ORDER — ISOSORBIDE MONONITRATE ER 30 MG PO TB24: 45.0000 mg | ORAL_TABLET | Freq: Two times a day (BID) | ORAL | 0 refills | Status: DC

## 2018-11-24 NOTE — Addendum Note (Signed)
Addended by: Debbora Lacrosse R on: 11/24/2018 10:18 AM   Modules accepted: Orders

## 2018-11-24 NOTE — Telephone Encounter (Signed)
Returned pt's daughters call. She stated that the pt increased him imdur to 45 mg on Wednesday evening, took 45 mg bid on Thursday and had taken it this morning as well. He did wake up this morning with some chest discomfort around 6 am, but it only lasted a few minutes and got better. His blood pressures over past few days ( 150/71 hr 70, 148/81 hr 80, 141/77 hr 66)  She will continue the 45 mg bid of imdur for him until his virtual visit with you on Tuesday (6/9)

## 2018-11-24 NOTE — Telephone Encounter (Signed)
Pt's daughter Anthony Murray is calling back this morning, can be reached @ [phone]

## 2018-11-28 ENCOUNTER — Encounter: Payer: Self-pay | Admitting: Cardiology

## 2018-11-28 ENCOUNTER — Telehealth: Payer: Self-pay

## 2018-11-28 ENCOUNTER — Telehealth (INDEPENDENT_AMBULATORY_CARE_PROVIDER_SITE_OTHER): Payer: PPO | Admitting: Cardiology

## 2018-11-28 VITALS — BP 148/76 | HR 88 | Ht 63.0 in | Wt 198.0 lb

## 2018-11-28 DIAGNOSIS — I25119 Atherosclerotic heart disease of native coronary artery with unspecified angina pectoris: Secondary | ICD-10-CM | POA: Diagnosis not present

## 2018-11-28 MED ORDER — ISOSORBIDE MONONITRATE ER 60 MG PO TB24: 60.0000 mg | ORAL_TABLET | Freq: Two times a day (BID) | ORAL | 1 refills | Status: DC

## 2018-11-28 NOTE — Telephone Encounter (Signed)
Error

## 2018-11-28 NOTE — Patient Instructions (Signed)
Your physician recommends that you schedule a follow-up appointment in: [place] has recommended you make the following change in your medication:   INCREASE IMDUR 60 MG TWICE DAILY  Thank you for choosing [facility]!!

## 2018-11-28 NOTE — Addendum Note (Signed)
Addended by: Julian Hy T on: 11/28/2018 09:46 AM   Modules accepted: Orders

## 2018-11-28 NOTE — Progress Notes (Signed)
Virtual Visit via Telephone Note   This visit type was conducted due to national recommendations for restrictions regarding the COVID-19 Pandemic (e.g. social distancing) in an effort to limit this patient's exposure and mitigate transmission in our community.  Due to his co-morbid illnesses, this patient is at least at moderate risk for complications without adequate follow up.  This format is felt to be most appropriate for this patient at this time.  The patient did not have access to video technology/had technical difficulties with video requiring transitioning to audio format only (telephone).  All issues noted in this document were discussed and addressed.  No physical exam could be performed with this format.  Please refer to the patient's chart for his  consent to telehealth for Anthony Murray.   Date:  11/28/2018   ID:  Anthony Murray, DOB Dec 30, 1927, MRN [id number]  Patient Location: Home Provider Location: Office  PCP:  [name], MD  Cardiologist:  [name], MD  Electrophysiologist:  None   Evaluation Performed:  Follow-Up Visit  Chief Complaint:  Chest pain  History of Present Illness:    Anthony Murray is a 85 y.o. male seen today for follow up of the following medical problems. This is a focused visit on recent symptoms of chest pain   1. CAD - cath 11/2004 with moderate non-obstructive disease - echo 09/2012 LVEF 55-60%  -2 episodes chest pain. Unsure of character of pain, moderate to severe. Lasted about 10 minutes. No other associated symptoms. Not positional. Last episode about 3 weeks.  - recent chest pain. Started on Friday night.  - pain in chest and in left arm. Difficult to describe. 8/10 in severity. No other associated symptoms. Longest duration 10-15 minutes. Worst with exertion. Different from prior chest pains - 6 episodes since Friday. Progressing in duration.   -chest pains somewhat improving iover the last few days, we have been  titrating up imdur, most recently to 105m bid. Essentially episodes down to just once daily, less severe in intensity.     2. Aortic stenosis - 08/2014 echo LVEF 55-60%, mod to severe AS (mean grad 11, AVA0.99) 08/2015 echo LVEF 633-35% grade I diastolic dysfunction, mean grad 10, dimensionless index 0.39, AVA VTI 0.88 - 07/2017 echo mean grad 11, dimensionless index .35, AVA VTI 0.88 - 07/2017 TEE AVA planimetry 1.44 - 10/2017 TTE mean grad 12, AVA VTI 1.27 -10/2018 TTE: AVA VTI 0.81, dimensionless index 0.36, mean grad 12. Dimensionless index and gradient would suggest LVOT is underestimated at 1.7 cm, from prior studies reported at 2.1 cm.        The patient does not have symptoms concerning for COVID-19 infection (fever, chills, cough, or new shortness of breath).    Past Medical History:  Diagnosis Date  . Aortic valve stenosis   . ASCVD (arteriosclerotic cardiovascular disease)    50% ostial left left anterior desending 60% mid circumflex and normal ejection fractioon in 2006 exertional dyspnea;history of chest discomfort  . Cerebrovascular disease    minimal carotid bruits  . CKD (chronic kidney disease)   . Diabetes mellitus type 2, controlled ([place]    no insulin  . Hyperlipidemia    statin intolerant  . Hypertension   . Tobacco abuse, in remission    30 pack years stopped in 1971   Past Surgical History:  Procedure Laterality Date  . APPENDECTOMY    . CARPAL TUNNEL RELEASE     surgery left 05/05/2001  . COLONOSCOPY N/A 10/01/2017  Procedure: COLONOSCOPY;  Surgeon: Jackquline Denmark, MD;  Location: [facility] ENDOSCOPY;  Service: Endoscopy;  Laterality: N/A;  . KNEE ARTHROSCOPY     bilaterial; unknown associated surgical procedure  . SHOULDER SURGERY     Right x2; third procedure on left  . TEE WITHOUT CARDIOVERSION N/A 07/29/2017   Procedure: TRANSESOPHAGEAL ECHOCARDIOGRAM (TEE) WITH PROPOFOL;  Surgeon: Satira Sark, MD;  Location: [facility] ENDO SUITE;  Service:  Cardiovascular;  Laterality: N/A;  . TONSILLECTOMY    . TRANSURETHRAL INCISION OF PROSTATE     resection of the prostate     No outpatient medications have been marked as taking for the 11/28/18 encounter (Appointment) with Anthony Lenis, MD.     Allergies:   Codeine; Ezetimibe-simvastatin; Naproxen; Niacin; Nsaids; and Pravastatin sodium   Social History   Tobacco Use  . Smoking status: Former Smoker    Packs/day: 1.00    Years: 40.00    Pack years: 40.00    Types: Cigarettes    Last attempt to quit: 10/29/1969    Years since quitting: 49.1  . Smokeless tobacco: Never Used  Substance Use Topics  . Alcohol use: No    Alcohol/week: 0.0 standard drinks  . Drug use: No     Family Hx: The patient's family history includes Heart disease in his father; Multiple sclerosis in his mother; Peripheral vascular disease in his sister.  ROS:   Please see the history of present illness.     All other systems reviewed and are negative.   Prior CV studies:   The following studies were reviewed today:  09/2012 stress echo:  Study Conclusions  - Stress ECG conclusions: There were no significant stress arrhythmias or conduction abnormalities; rare PVCs were present during exercise and in recovery. The stress ECG was negative for ischemia. - Staged echo: There was no echocardiographic evidence for stress-induced ischemia.  09/2012 echo Study Conclusions  - Left ventricle: The cavity size was normal. Wall thickness was increased in a pattern of mild LVH. There was moderate focal basal hypertrophy of the septum. Systolic function was normal. The estimated ejection fraction was in the range of 55% to 60%. Wall motion was normal; there were no regional wall motion abnormalities. - Aortic valve: Mildly to moderately calcified annulus. Moderately thickened, moderately calcified leaflets. Valve mobility was mildly restricted. There was mild stenosis.  Trivial regurgitation. Valve area: 0.9cm^2(VTI). Valve area: 0.79cm^2 (Vmax). - Aorta: The aorta was moderately calcified. - Mitral valve: Calcified annulus. Mildly calcified leaflets . - Atrial septum: No defect or patent foramen ovale was identified.  11/2004 Cath FINDINGS: 1. Patient does not have dextrocardia. The heart is normally located in  the left side of the chest. Aortic arch appears normal based on the  course of the catheter through it. 2. LV 167/19/25. 3. No aortic stenosis. 4. Left main: Angiographically normal. 5. LAD: Large vessel giving rise to a single, very large, diagonal. There  is a 50% stenosis of the ostium of the LAD. 6. Circumflex: Large vessel giving rise to three obtuse marginals. There  is a 60% stenosis of the mid vessel. 7. RCA: Tortuous, dominant vessel. It is angiographically normal.  IMPRESSION: 1. No dextrocardia. 2. No previously placed coronary stent. 3. Moderate nonobstructive coronary disease. 4. Elevated left ventricular end diastolic pressure in the setting of  systemic hypertension.  08/2014 echo Study Conclusions  - Left ventricle: The cavity size was normal. There was mild focal basal hypertrophy of the septum. Systolic function was normal. The estimated  ejection fraction was in the range of 55% to 60%. Wall motion was normal; there were no regional wall motion abnormalities. Doppler parameters are consistent with abnormal left ventricular relaxation (grade 1 diastolic dysfunction). - Aortic valve: Functionally bicuspid; moderately calcified leaflets. Cusp separation was reduced. There was moderate to severe stenosis. There was mild regurgitation. Mean gradient (S): 11 mm Hg. Peak gradient (S): 22 mm Hg. VTI ratio of LVOT to aortic valve: 0.44. Valve area (VTI): 0.99 cm^2. - Mitral valve: Calcified annulus. There was trivial regurgitation. - Left  atrium: The atrium was mildly dilated. - Right atrium: Central venous pressure (est): 8 mm Hg. - Tricuspid valve: There was trivial regurgitation. - Pulmonary arteries: Systolic pressure could not be accurately estimated. - Pericardium, extracardiac: There was no pericardial effusion.  Impressions:  - Mild basal septal hypertrophy with LVEF 55-60% and grade 1 diastolic dysfunction. Mild left atrial enlargement. Functionally bicuspid aortic valve with mild aortic regurgitation and moderate to severe aortic stenosis. Measures are somewhat discordant with relatively low gradients and LVOT/AV ratio 0.44, but planimetry and VTI valve area around 1 cm2. Unable to assess PASP.  01/2015 PFTs Mild to moderate ventilatory defect   08/2015 echo  Study Conclusions  - Left ventricle: The cavity size was normal. Wall thickness was  increased in a pattern of mild LVH. Systolic function was normal.  The estimated ejection fraction was in the range of 60% to 65%.  Wall motion was normal; there were no regional wall motion  abnormalities. Doppler parameters are consistent with abnormal  left ventricular relaxation (grade 1 diastolic dysfunction). - Aortic valve: Mildly to moderately calcified annulus.  Functionally bicuspid. There was mild regurgitation. Mean  gradient (S): 10 mm Hg. Peak gradient (S): 20 mm Hg. VTI ratio of  LVOT to aortic valve: 0.39. Valve area (VTI): 0.88 cm^2. - Mitral valve: Calcified annulus. There was trivial regurgitation. - Left atrium: The atrium was at the upper limits of normal in  size. - Right atrium: Central venous pressure (est): 3 mm Hg. - Tricuspid valve: There was trivial regurgitation. - Pulmonary arteries: Systolic pressure could not be accurately  estimated. - Pericardium, extracardiac: There was no pericardial effusion.  Impressions:  - Mild LVH with LVEF 60-65%. Grade 1 diastolic dysfunction with  normal estimated LV  filling pressure. Upper normal left atrial  chamber size. MAC with trivial mitral regurgitation. Functionally  bicuspid, calcified aortic valve with at least moderate aortic  stenosis. As mentioned in the prior study, measurements are  discordant with relatively low gradients but LVOT/AV VTI ratio  0.39. Unable to get good valve planimetry on this particular  study. There is mild aortic regurgitation.  07/2017 echo Study Conclusions  - Left ventricle: The cavity size was normal. Wall thickness was increased in a pattern of mild LVH. Systolic function was normal. The estimated ejection fraction was in the range of 55% to 60%. Wall motion was normal; there were no regional wall motion abnormalities. Doppler parameters are consistent with abnormal left ventricular relaxation (grade 1 diastolic dysfunction). - Aortic valve: Functionally bicuspid; moderately calcified leaflets. There was moderate to severe stenosis. There was mild regurgitation. Mean gradient (S): 11 mm Hg. Peak gradient (S): 20 mm Hg. VTI ratio of LVOT to aortic valve: 0.35. Valve area (VTI): 0.88 cm^2. - Mitral valve: Mildly calcified annulus. There was mild regurgitation. There is an intermittently seen echodensity seen on the atrial side of the anterior mitral leaflet concerning for vegetation in patient with history of fever and stroke. -  Left atrium: The atrium was mildly dilated. - Right atrium: Central venous pressure (est): 8 mm Hg. - Tricuspid valve: There was trivial regurgitation. - Pulmonary arteries: Systolic pressure could not be accurately estimated. - Pericardium, extracardiac: There was no pericardial effusion.  Impressions:  - There is an intermittently seen echodensity seen on the atrial side of the anterior mitral leaflet concerning for vegetation in patient with history of fever and stroke.   07/2017 TEE Study Conclusions  - Left ventricle: Systolic  function was normal. The estimated ejection fraction was in the range of 55% to 60%. Wall motion was normal; there were no regional wall motion abnormalities. - Aortic valve: Small, freely mobile echodensity seen intermittently on the ventricular side of the right coronary aortic cusp suggestive of vegetation given clinical scenario. There was moderate stenosis. Valve area approximately 1.4 cm2 by planimetry. There was mild regurgitation. - Aorta: Moderate to severe diffuse atherosclerosis present. - Mitral valve: Very small, freely mobile echodensity on the atrial side of the posterior mitral leaflet suggestive of vegetation given clinical scenario. The larger abnormality in region of anterior leaflet noted on the transthoracic study is not present - perhaps more reflective of artifact. Mildly calcified annulus. There was mild regurgitation. - Left atrium: The atrium was dilated. No evidence of thrombus in the atrial cavity or appendage. Emptying velocity was normal. - Right atrium: No evidence of thrombus in the atrial cavity or appendage. - Atrial septum: No significant defect or patent foramen ovale was identified by color Doppler or agitated saline injection. - Tricuspid valve: There was trivial regurgitation. - Pulmonic valve: There was mild regurgitation. - Pericardium, extracardiac: There was no pericardial effusion.  Impressions:  - Very small, freely mobile echodensity on the atrial side of the posterior mitral leaflet suggestive of vegetation given clinical scenario. The larger abnormality in region of anterior leaflet noted on the transthoracic study is not present - perhaps more reflective of artifact. There is also a small, freely mobile echodensity seen intermittently on the ventricular side of the right coronary aortic cusp suggestive of vegetation given clinical scenario. No significant defect or patent foramen ovale  was identified by color Doppler or agitated saline injection   10/2017 echo Study Conclusions  - Left ventricle: The cavity size was normal. Wall thickness was increased in a pattern of mild LVH. Systolic function was normal. The estimated ejection fraction was in the range of 55% to 60%. Wall motion was normal; there were no regional wall motion abnormalities. Doppler parameters are consistent with abnormal left ventricular relaxation (grade 1 diastolic dysfunction). - Aortic valve: Moderately calcified annulus. Probably trileaflet; mildly calcified leaflets. Valve mobility was restricted. There was moderate stenosis. There was mild regurgitation. Mean gradient (S): 12 mm Hg. VTI ratio of LVOT to aortic valve: 0.37. Valve area (VTI): 1.27 cm^2. - Mitral valve: Mildly calcified annulus. Mildly thickened, mildly calcified leaflets. There was mild regurgitation. - Right atrium: Central venous pressure (est): 3 mm Hg. - Atrial septum: No defect or patent foramen ovale was identified. - Tricuspid valve: There was trivial regurgitation. - Pulmonary arteries: PA peak pressure: 19 mm Hg (S). - Pericardium, extracardiac: There was no pericardial effusion.  Impressions:  - No definitive valvular vegetations noted.  Labs/Other Tests and Data Reviewed:    EKG:  No ECG reviewed.  Recent Labs: 12/26/2017: Magnesium 2.0 11/06/2018: BUN 22; Creatinine, Ser 1.89; Hemoglobin 15.7; Platelets 169; Potassium 3.7; Sodium 141   Recent Lipid Panel Lab Results  Component Value Date/Time   CHOL  164 07/28/2017 04:01 AM   TRIG 156 (H) 07/28/2017 04:01 AM   HDL 25 (L) 07/28/2017 04:01 AM   CHOLHDL 6.6 07/28/2017 04:01 AM   LDLCALC 108 (H) 07/28/2017 04:01 AM    Wt Readings from Last 3 Encounters:  11/06/18 200 lb (90.7 kg)  03/13/18 196 lb (88.9 kg)  02/23/18 192 lb (87.1 kg)     Objective:    Vital Signs:   Today's Vitals   11/28/18 0903  BP: (!) 148/76  Pulse:  88  Weight: 198 lb (89.8 kg)  Height: _0  (1.6 m)   Body mass index is 35.07 kg/m.  Normal affect. Normal speech pattern and tone. Comfortable no apparent distress. NO audible signs of SOB or wheezing.   ASSESSMENT & PLAN:    1. CAD - moderate disease by cath 2006 - recent chest pains concerning for angina. Due to advanced age, multiple comorbidities, and CKD 3 we have been reluctant to pursue cath. The patient and family have been reluctant as well, but in our talks today open to it as a last resort - some improvement with antianginal titration, increase imdur further to 9m bid and follow symptoms - if were to pursue cath, would admit night prior for IV hydration   F/u 1 week to reassess symptoms     COVID-19 Education: The signs and symptoms of COVID-19 were discussed with the patient and how to seek care for testing (follow up with PCP or arrange E-visit).  The importance of social distancing was discussed today.  Time:   Today, I have spent 13 minutes with the patient with telehealth technology discussing the above problems.     Medication Adjustments/Labs and Tests Ordered: Current medicines are reviewed at length with the patient today.  Concerns regarding medicines are outlined above.   Tests Ordered: No orders of the defined types were placed in this encounter.   Medication Changes: No orders of the defined types were placed in this encounter.   Disposition:  Follow up 1 week virtual visit  Signed, BCarlyle Dolly MD  11/28/2018 8:26 AM    CYoung Harris

## 2018-11-30 ENCOUNTER — Telehealth: Payer: Self-pay

## 2018-11-30 NOTE — Telephone Encounter (Signed)
error 

## 2018-12-06 ENCOUNTER — Telehealth (INDEPENDENT_AMBULATORY_CARE_PROVIDER_SITE_OTHER): Payer: PPO | Admitting: Cardiology

## 2018-12-06 ENCOUNTER — Encounter: Payer: Self-pay | Admitting: Cardiology

## 2018-12-06 ENCOUNTER — Other Ambulatory Visit: Payer: Self-pay

## 2018-12-06 VITALS — BP 157/84 | HR 69 | Ht 64.0 in | Wt 200.0 lb

## 2018-12-06 DIAGNOSIS — I25119 Atherosclerotic heart disease of native coronary artery with unspecified angina pectoris: Secondary | ICD-10-CM | POA: Diagnosis not present

## 2018-12-06 MED ORDER — RANOLAZINE ER 500 MG PO TB12: 500.0000 mg | ORAL_TABLET | Freq: Two times a day (BID) | ORAL | 3 refills | Status: DC

## 2018-12-06 NOTE — Addendum Note (Signed)
Addended by: Debbora Lacrosse R on: 12/06/2018 09:12 AM   Modules accepted: Orders

## 2018-12-06 NOTE — Progress Notes (Signed)
Virtual Visit via Video Note   This visit type was conducted due to national recommendations for restrictions regarding the COVID-19 Pandemic (e.g. social distancing) in an effort to limit this patient's exposure and mitigate transmission in our community.  Due to his co-morbid illnesses, this patient is at least at moderate risk for complications without adequate follow up.  This format is felt to be most appropriate for this patient at this time.  All issues noted in this document were discussed and addressed.  A limited physical exam was performed with this format.  Please refer to the patient's chart for his consent to telehealth for [facility].  Date:  12/06/2018   ID:  Anthony Murray, DOB [birth year], MRN [id number]  Patient Location: Home Provider Location: Office  PCP:  [name], MD  Cardiologist:  [name], MD  Electrophysiologist:  None   Evaluation Performed:  Follow-Up Visit  Chief Complaint:  Chest pain  History of Present Illness:    Anthony Murray is a 85 y.o. male seen today for follow up of the following medical problems. This is a focused visit on recent symptoms of chest pain   1. CAD - cath 11/2004 with moderate non-obstructive disease - echo 09/2012 LVEF 55-60%  -2 episodes chest pain. Unsure of character of pain, moderate to severe. Lasted about 10 minutes. No other associated symptoms. Not positional. Last episode about 3 weeks.  - recent chest pain. Started on Friday night.  - pain in chest and in left arm. Difficult to describe. 8/10 in severity. No other associated symptoms. Longest duration 10-15 minutes. Worst with exertion. Different from prior chest pains - 6 episodes since Friday. Progressing in duration.   -chest pains somewhat improving iover the last few days, we have been titrating up imdur, most recently to 49m bid. Essentially episodes down to just once daily, less severe in intensity.    - last visit we increased  imdur to 69mbid - ongoing chest pain. Somewhat less frequent but still occurring fairly regularly, multiple times a day, severity anywhere from 3-10/10     The patient does not have symptoms concerning for COVID-19 infection (fever, chills, cough, or new shortness of breath).    Past Medical History:  Diagnosis Date  . Aortic valve stenosis   . ASCVD (arteriosclerotic cardiovascular disease)    50% ostial left left anterior desending 60% mid circumflex and normal ejection fractioon in 2006 exertional dyspnea;history of chest discomfort  . Cerebrovascular disease    minimal carotid bruits  . CKD (chronic kidney disease)   . Diabetes mellitus type 2, controlled ([facility]   no insulin  . Hyperlipidemia    statin intolerant  . Hypertension   . Tobacco abuse, in remission    30 pack years stopped in 1971   Past Surgical History:  Procedure Laterality Date  . APPENDECTOMY    . CARPAL TUNNEL RELEASE     surgery left 05/05/2001  . COLONOSCOPY N/A 10/01/2017   Procedure: COLONOSCOPY;  Surgeon: GuJackquline DenmarkMD;  [facility];  Service: Endoscopy;  Laterality: N/A;  . KNEE ARTHROSCOPY     bilaterial; unknown associated surgical procedure  . SHOULDER SURGERY     Right x2; third procedure on left  . TEE WITHOUT CARDIOVERSION N/A 07/29/2017   Procedure: TRANSESOPHAGEAL ECHOCARDIOGRAM (TEE) WITH PROPOFOL;  Surgeon: McSatira SarkMD;  [facility];  Service: Cardiovascular;  Laterality: N/A;  . TONSILLECTOMY    . TRANSURETHRAL INCISION OF  PROSTATE     resection of the prostate     No outpatient medications have been marked as taking for the 12/06/18 encounter (Appointment) with [name], MD.     Allergies:   Codeine, Ezetimibe-simvastatin, Naproxen, Niacin, Nsaids, and Pravastatin sodium   Social History   Tobacco Use  . Smoking status: Former Smoker    Packs/day: 1.00    Years: 40.00    Pack years: 40.00    Types: Cigarettes    Quit date:  10/29/1969    Years since quitting: 49.1  . Smokeless tobacco: Never Used  Substance Use Topics  . Alcohol use: No    Alcohol/week: 0.0 standard drinks  . Drug use: No     Family Hx: The patient's family history includes Heart disease in his father; Multiple sclerosis in his mother; Peripheral vascular disease in his sister.  ROS:   Please see the history of present illness.     All other systems reviewed and are negative.   Prior CV studies:   The following studies were reviewed today:  09/2012 stress echo:  Study Conclusions  - Stress ECG conclusions: There were no significant stress arrhythmias or conduction abnormalities; rare PVCs were present during exercise and in recovery. The stress ECG was negative for ischemia. - Staged echo: There was no echocardiographic evidence for stress-induced ischemia.  09/2012 echo Study Conclusions  - Left ventricle: The cavity size was normal. Wall thickness was increased in a pattern of mild LVH. There was moderate focal basal hypertrophy of the septum. Systolic function was normal. The estimated ejection fraction was in the range of 55% to 60%. Wall motion was normal; there were no regional wall motion abnormalities. - Aortic valve: Mildly to moderately calcified annulus. Moderately thickened, moderately calcified leaflets. Valve mobility was mildly restricted. There was mild stenosis. Trivial regurgitation. Valve area: 0.9cm^2(VTI). Valve area: 0.79cm^2 (Vmax). - Aorta: The aorta was moderately calcified. - Mitral valve: Calcified annulus. Mildly calcified leaflets . - Atrial septum: No defect or patent foramen ovale was identified.  11/2004 Cath FINDINGS: 1. Patient does not have dextrocardia. The heart is normally located in  the left side of the chest. Aortic arch appears normal based on the  course of the catheter through it. 2. LV 167/19/25. 3. No aortic stenosis. 4.  Left main: Angiographically normal. 5. LAD: Large vessel giving rise to a single, very large, diagonal. There  is a 50% stenosis of the ostium of the LAD. 6. Circumflex: Large vessel giving rise to three obtuse marginals. There  is a 60% stenosis of the mid vessel. 7. RCA: Tortuous, dominant vessel. It is angiographically normal.  IMPRESSION: 1. No dextrocardia. 2. No previously placed coronary stent. 3. Moderate nonobstructive coronary disease. 4. Elevated left ventricular end diastolic pressure in the setting of  systemic hypertension.  08/2014 echo Study Conclusions  - Left ventricle: The cavity size was normal. There was mild focal basal hypertrophy of the septum. Systolic function was normal. The estimated ejection fraction was in the range of 55% to 60%. Wall motion was normal; there were no regional wall motion abnormalities. Doppler parameters are consistent with abnormal left ventricular relaxation (grade 1 diastolic dysfunction). - Aortic valve: Functionally bicuspid; moderately calcified leaflets. Cusp separation was reduced. There was moderate to severe stenosis. There was mild regurgitation. Mean gradient (S): 11 mm Hg. Peak gradient (S): 22 mm Hg. VTI ratio of LVOT to aortic valve: 0.44. Valve area (VTI): 0.99 cm^2. - Mitral valve: Calcified annulus. There was  trivial regurgitation. - Left atrium: The atrium was mildly dilated. - Right atrium: Central venous pressure (est): 8 mm Hg. - Tricuspid valve: There was trivial regurgitation. - Pulmonary arteries: Systolic pressure could not be accurately estimated. - Pericardium, extracardiac: There was no pericardial effusion.  Impressions:  - Mild basal septal hypertrophy with LVEF 55-60% and grade 1 diastolic dysfunction. Mild left atrial enlargement. Functionally bicuspid aortic valve with mild aortic regurgitation and moderate to severe aortic stenosis.  Measures are somewhat discordant with relatively low gradients and LVOT/AV ratio 0.44, but planimetry and VTI valve area around 1 cm2. Unable to assess PASP.  01/2015 PFTs Mild to moderate ventilatory defect   08/2015 echo  Study Conclusions  - Left ventricle: The cavity size was normal. Wall thickness was  increased in a pattern of mild LVH. Systolic function was normal.  The estimated ejection fraction was in the range of 60% to 65%.  Wall motion was normal; there were no regional wall motion  abnormalities. Doppler parameters are consistent with abnormal  left ventricular relaxation (grade 1 diastolic dysfunction). - Aortic valve: Mildly to moderately calcified annulus.  Functionally bicuspid. There was mild regurgitation. Mean  gradient (S): 10 mm Hg. Peak gradient (S): 20 mm Hg. VTI ratio of  LVOT to aortic valve: 0.39. Valve area (VTI): 0.88 cm^2. - Mitral valve: Calcified annulus. There was trivial regurgitation. - Left atrium: The atrium was at the upper limits of normal in  size. - Right atrium: Central venous pressure (est): 3 mm Hg. - Tricuspid valve: There was trivial regurgitation. - Pulmonary arteries: Systolic pressure could not be accurately  estimated. - Pericardium, extracardiac: There was no pericardial effusion.  Impressions:  - Mild LVH with LVEF 60-65%. Grade 1 diastolic dysfunction with  normal estimated LV filling pressure. Upper normal left atrial  chamber size. MAC with trivial mitral regurgitation. Functionally  bicuspid, calcified aortic valve with at least moderate aortic  stenosis. As mentioned in the prior study, measurements are  discordant with relatively low gradients but LVOT/AV VTI ratio  0.39. Unable to get good valve planimetry on this particular  study. There is mild aortic regurgitation.  07/2017 echo Study Conclusions  - Left ventricle: The cavity size was normal. Wall thickness was increased in a  pattern of mild LVH. Systolic function was normal. The estimated ejection fraction was in the range of 55% to 60%. Wall motion was normal; there were no regional wall motion abnormalities. Doppler parameters are consistent with abnormal left ventricular relaxation (grade 1 diastolic dysfunction). - Aortic valve: Functionally bicuspid; moderately calcified leaflets. There was moderate to severe stenosis. There was mild regurgitation. Mean gradient (S): 11 mm Hg. Peak gradient (S): 20 mm Hg. VTI ratio of LVOT to aortic valve: 0.35. Valve area (VTI): 0.88 cm^2. - Mitral valve: Mildly calcified annulus. There was mild regurgitation. There is an intermittently seen echodensity seen on the atrial side of the anterior mitral leaflet concerning for vegetation in patient with history of fever and stroke. - Left atrium: The atrium was mildly dilated. - Right atrium: Central venous pressure (est): 8 mm Hg. - Tricuspid valve: There was trivial regurgitation. - Pulmonary arteries: Systolic pressure could not be accurately estimated. - Pericardium, extracardiac: There was no pericardial effusion.  Impressions:  - There is an intermittently seen echodensity seen on the atrial side of the anterior mitral leaflet concerning for vegetation in patient with history of fever and stroke.   07/2017 TEE Study Conclusions  - Left ventricle: Systolic function was normal.  The estimated ejection fraction was in the range of 55% to 60%. Wall motion was normal; there were no regional wall motion abnormalities. - Aortic valve: Small, freely mobile echodensity seen intermittently on the ventricular side of the right coronary aortic cusp suggestive of vegetation given clinical scenario. There was moderate stenosis. Valve area approximately 1.4 cm2 by planimetry. There was mild regurgitation. - Aorta: Moderate to severe diffuse atherosclerosis present. - Mitral  valve: Very small, freely mobile echodensity on the atrial side of the posterior mitral leaflet suggestive of vegetation given clinical scenario. The larger abnormality in region of anterior leaflet noted on the transthoracic study is not present - perhaps more reflective of artifact. Mildly calcified annulus. There was mild regurgitation. - Left atrium: The atrium was dilated. No evidence of thrombus in the atrial cavity or appendage. Emptying velocity was normal. - Right atrium: No evidence of thrombus in the atrial cavity or appendage. - Atrial septum: No significant defect or patent foramen ovale was identified by color Doppler or agitated saline injection. - Tricuspid valve: There was trivial regurgitation. - Pulmonic valve: There was mild regurgitation. - Pericardium, extracardiac: There was no pericardial effusion.  Impressions:  - Very small, freely mobile echodensity on the atrial side of the posterior mitral leaflet suggestive of vegetation given clinical scenario. The larger abnormality in region of anterior leaflet noted on the transthoracic study is not present - perhaps more reflective of artifact. There is also a small, freely mobile echodensity seen intermittently on the ventricular side of the right coronary aortic cusp suggestive of vegetation given clinical scenario. No significant defect or patent foramen ovale was identified by color Doppler or agitated saline injection   10/2017 echo Study Conclusions  - Left ventricle: The cavity size was normal. Wall thickness was increased in a pattern of mild LVH. Systolic function was normal. The estimated ejection fraction was in the range of 55% to 60%. Wall motion was normal; there were no regional wall motion abnormalities. Doppler parameters are consistent with abnormal left ventricular relaxation (grade 1 diastolic dysfunction). - Aortic valve: Moderately calcified  annulus. Probably trileaflet; mildly calcified leaflets. Valve mobility was restricted. There was moderate stenosis. There was mild regurgitation. Mean gradient (S): 12 mm Hg. VTI ratio of LVOT to aortic valve: 0.37. Valve area (VTI): 1.27 cm^2. - Mitral valve: Mildly calcified annulus. Mildly thickened, mildly calcified leaflets. There was mild regurgitation. - Right atrium: Central venous pressure (est): 3 mm Hg. - Atrial septum: No defect or patent foramen ovale was identified. - Tricuspid valve: There was trivial regurgitation. - Pulmonary arteries: PA peak pressure: 19 mm Hg (S). - Pericardium, extracardiac: There was no pericardial effusion.  Impressions:  - No definitive valvular vegetations noted.  Labs/Other Tests and Data Reviewed:    EKG:  No ECG reviewed.  Recent Labs: 12/26/2017: Magnesium 2.0 11/06/2018: BUN 22; Creatinine, Ser 1.89; Hemoglobin 15.7; Platelets 169; Potassium 3.7; Sodium 141   Recent Lipid Panel Lab Results  Component Value Date/Time   CHOL 164 07/28/2017 04:01 AM   TRIG 156 (H) 07/28/2017 04:01 AM   HDL 25 (L) 07/28/2017 04:01 AM   CHOLHDL 6.6 07/28/2017 04:01 AM   LDLCALC 108 (H) 07/28/2017 04:01 AM    Wt Readings from Last 3 Encounters:  11/28/18 198 lb (89.8 kg)  11/06/18 200 lb (90.7 kg)  03/13/18 196 lb (88.9 kg)     Objective:    Vital Signs:   Today's Vitals   12/06/18 0818  BP: (!) 157/84  Pulse: 69  Weight: 200 lb (90.7 kg)  Height: _0  (1.626 m)   Body mass index is 34.33 kg/m.  Normal affect. Normal speech pattern and tone. Elderly male well nourished sitting comfortable in no distress. No visual or audible signs of SOB   ASSESSMENT & PLAN:    1. CAD -moderate disease by cath 2006 - recent chest pains concerning for angina. Due to advanced age, multiple comorbidities, and CKD 3 we have been reluctant to pursue cath.  - we have been trying everything we can with antianginal therapy. Currently on CCB,  beta blocker, nitrate. Some orthostatic symptoms at times, will not further titrate. Try ranexa 557m bid - f/u again next week. If ongoing symptoms we may have to proceed with cath, we discussed again today the associated risks for him including risks to his kidney function, would need admission prior to cath for IVFs if we decided for cath     COVID-19 Education: The signs and symptoms of COVID-19 were discussed with the patient and how to seek care for testing (follow up with PCP or arrange E-visit).  The importance of social distancing was discussed today.  Time:   Today, I have spent 15 minutes with the patient with telehealth technology discussing the above problems.     Medication Adjustments/Labs and Tests Ordered: Current medicines are reviewed at length with the patient today.  Concerns regarding medicines are outlined above.   Tests Ordered: No orders of the defined types were placed in this encounter.   Medication Changes: No orders of the defined types were placed in this encounter.   Follow Up:  Virtual Visit in 1 week(s)  Signed, BCarlyle Dolly MD  12/06/2018 8:09 AM    [place]

## 2018-12-06 NOTE — Patient Instructions (Signed)
Medication Instructions:  Start RANEXA 500 MG- TWO TIMES DAILY   Labwork: NONE  Testing/Procedures: NONE  Follow-Up: Your physician recommends that you schedule a follow-up appointment in: December 14, 2018 @ 2   Any Other Special Instructions Will Be Listed Below (If Applicable).     If you need a refill on your cardiac medications before your next appointment, please call your pharmacy.

## 2018-12-11 DIAGNOSIS — E1151 Type 2 diabetes mellitus with diabetic peripheral angiopathy without gangrene: Secondary | ICD-10-CM | POA: Diagnosis not present

## 2018-12-11 DIAGNOSIS — R600 Localized edema: Secondary | ICD-10-CM | POA: Diagnosis not present

## 2018-12-11 DIAGNOSIS — E1122 Type 2 diabetes mellitus with diabetic chronic kidney disease: Secondary | ICD-10-CM | POA: Diagnosis not present

## 2018-12-11 DIAGNOSIS — J449 Chronic obstructive pulmonary disease, unspecified: Secondary | ICD-10-CM | POA: Diagnosis not present

## 2018-12-11 DIAGNOSIS — S72012D Unspecified intracapsular fracture of left femur, subsequent encounter for closed fracture with routine healing: Secondary | ICD-10-CM | POA: Diagnosis not present

## 2018-12-11 DIAGNOSIS — S72099A Other fracture of head and neck of unspecified femur, initial encounter for closed fracture: Secondary | ICD-10-CM | POA: Diagnosis not present

## 2018-12-11 DIAGNOSIS — R4189 Other symptoms and signs involving cognitive functions and awareness: Secondary | ICD-10-CM | POA: Diagnosis not present

## 2018-12-11 DIAGNOSIS — I350 Nonrheumatic aortic (valve) stenosis: Secondary | ICD-10-CM | POA: Diagnosis not present

## 2018-12-11 DIAGNOSIS — I129 Hypertensive chronic kidney disease with stage 1 through stage 4 chronic kidney disease, or unspecified chronic kidney disease: Secondary | ICD-10-CM | POA: Diagnosis not present

## 2018-12-11 DIAGNOSIS — E1165 Type 2 diabetes mellitus with hyperglycemia: Secondary | ICD-10-CM | POA: Diagnosis not present

## 2018-12-11 DIAGNOSIS — I25119 Atherosclerotic heart disease of native coronary artery with unspecified angina pectoris: Secondary | ICD-10-CM | POA: Diagnosis not present

## 2018-12-11 DIAGNOSIS — M19042 Primary osteoarthritis, left hand: Secondary | ICD-10-CM | POA: Diagnosis not present

## 2018-12-11 DIAGNOSIS — Z6830 Body mass index (BMI) 30.0-30.9, adult: Secondary | ICD-10-CM | POA: Diagnosis not present

## 2018-12-11 DIAGNOSIS — I10 Essential (primary) hypertension: Secondary | ICD-10-CM | POA: Diagnosis not present

## 2018-12-11 DIAGNOSIS — R944 Abnormal results of kidney function studies: Secondary | ICD-10-CM | POA: Diagnosis not present

## 2018-12-11 DIAGNOSIS — E782 Mixed hyperlipidemia: Secondary | ICD-10-CM | POA: Diagnosis not present

## 2018-12-11 DIAGNOSIS — R079 Chest pain, unspecified: Secondary | ICD-10-CM | POA: Diagnosis not present

## 2018-12-11 DIAGNOSIS — N183 Chronic kidney disease, stage 3 (moderate): Secondary | ICD-10-CM | POA: Diagnosis not present

## 2018-12-11 DIAGNOSIS — R2689 Other abnormalities of gait and mobility: Secondary | ICD-10-CM | POA: Diagnosis not present

## 2018-12-11 DIAGNOSIS — I6529 Occlusion and stenosis of unspecified carotid artery: Secondary | ICD-10-CM | POA: Diagnosis not present

## 2018-12-11 DIAGNOSIS — R809 Proteinuria, unspecified: Secondary | ICD-10-CM | POA: Diagnosis not present

## 2018-12-11 DIAGNOSIS — R339 Retention of urine, unspecified: Secondary | ICD-10-CM | POA: Diagnosis not present

## 2018-12-11 DIAGNOSIS — M19041 Primary osteoarthritis, right hand: Secondary | ICD-10-CM | POA: Diagnosis not present

## 2018-12-14 ENCOUNTER — Ambulatory Visit (INDEPENDENT_AMBULATORY_CARE_PROVIDER_SITE_OTHER): Payer: PPO | Admitting: Cardiology

## 2018-12-14 ENCOUNTER — Encounter: Payer: Self-pay | Admitting: Cardiology

## 2018-12-14 ENCOUNTER — Encounter: Payer: Self-pay | Admitting: Internal Medicine

## 2018-12-14 ENCOUNTER — Other Ambulatory Visit: Payer: Self-pay | Admitting: Cardiology

## 2018-12-14 ENCOUNTER — Other Ambulatory Visit: Payer: Self-pay

## 2018-12-14 VITALS — BP 142/76 | HR 80 | Ht 66.0 in | Wt 205.0 lb

## 2018-12-14 DIAGNOSIS — I25118 Atherosclerotic heart disease of native coronary artery with other forms of angina pectoris: Secondary | ICD-10-CM

## 2018-12-14 DIAGNOSIS — R079 Chest pain, unspecified: Secondary | ICD-10-CM

## 2018-12-14 NOTE — Patient Instructions (Signed)
Medication Instructions:  Your physician recommends that you continue on your current medications as directed. Please refer to the Current Medication list given to you today.  Labwork: I HAVE REQUESTED LABS FROM PCP   Testing/Procedures: Your physician has requested that you have a cardiac catheterization. Cardiac catheterization is used to diagnose and/or treat various heart conditions. Doctors may recommend this procedure for a number of different reasons. The most common reason is to evaluate chest pain. Chest pain can be a symptom of coronary artery disease (CAD), and cardiac catheterization can show whether plaque is narrowing or blocking your heart's arteries. This procedure is also used to evaluate the valves, as well as measure the blood flow and oxygen levels in different parts of your heart. For further information please visit [email or web address]. Please follow instruction sheet, as given.    Follow-Up: Your physician recommends that you schedule a follow-up appointment in: 1 MONTH POST CATH   Any Other Special Instructions Will Be Listed Below (If Applicable).     If you need a refill on your cardiac medications before your next appointment, please call your pharmacy.    [address] [place] [address] Dept: [phone] Loc: [place]  12/14/2018  You are scheduled for a Cardiac Catheterization on Tuesday, June 30 with Dr. Shelva Majestic.  1. Please arrive at the [facility] (Main Entrance A) at [facility]: [address] [place], [place] [address] at 6:30 AM (This time is FOUR hours before your procedure to ensure your preparation & IV FLUIDS). Free valet parking service is available.   Special note: Every effort is made to have your procedure done on time. Please understand that emergencies sometimes delay scheduled procedures.  2. Diet: Do not eat  solid foods after midnight.  The patient may have clear liquids until 5am upon the day of the procedure.  3. Labs- REQUESTED FROM PCP - WILL DO COVID19 TESTING  Contrast Allergy: NONE    MEDICATIONS TO HOLD MORNING OF PROCEDURE: TORSEMIDE GLIPIZIDE  JANUVIA   On the morning of your procedure, take your Aspirin and any morning medicines NOT listed above.  You may use sips of water.  5. Plan for one night stay--bring personal belongings. 6. Bring a current list of your medications and current insurance cards. 7. You MUST have a responsible person to drive you home. 8. Someone MUST be with you the first 24 hours after you arrive home or your discharge will be delayed. 9. Please wear clothes that are easy to get on and off and wear slip-on shoes.  Thank you for allowing [place] to care for you!   -- [place] [facility]

## 2018-12-14 NOTE — H&P (View-Only) (Signed)
Clinical Summary Mr. Larke is a 85 y.o.male seen today for follow up of the following medical problems. This is a focused visit on recent symptoms of chest pain   1. CAD - cath 11/2004 with moderate non-obstructive disease - echo 09/2012 LVEF 55-60%  - over last several visits has had ongoing chest pain, we have titrated titrating and adding antianginal medical therapy.    since last visit ongoing chest pain. Occurs with walking from bathroom, overall mild exertion - pain has increased since adding ranexa. Intensity 7-10/10. Symptoms much worst one nigh he missed his meds. Spells usually last about 15 minutes.   2. Aortic stenosis - 08/2014 echo LVEF 55-60%, mod to severe AS (mean grad 11, AVA0.99) 08/2015 echo LVEF 92-33%, grade I diastolic dysfunction, mean grad 10, dimensionless index 0.39, AVA VTI 0.88 - 07/2017 echo mean grad 11, dimensionless index.35, AVA VTI 0.88 - 07/2017 TEE AVA planimetry 1.44 - 10/2017 TTE mean grad 12, AVA VTI 1.27 -10/2018 TTE: AVA VTI 0.81, dimensionless index 0.36, mean grad 12. Dimensionless index and gradient would suggest LVOT is underestimated at 1.7 cm, from prior studies reported at 2.1 cm.        Past Medical History:  Diagnosis Date  . Aortic valve stenosis   . ASCVD (arteriosclerotic cardiovascular disease)    50% ostial left left anterior desending 60% mid circumflex and normal ejection fractioon in 2006 exertional dyspnea;history of chest discomfort  . Cerebrovascular disease    minimal carotid bruits  . CKD (chronic kidney disease)   . Diabetes mellitus type 2, controlled ([place])    no insulin  . Hyperlipidemia    statin intolerant  . Hypertension   . Tobacco abuse, in remission    30 pack years stopped in 1971     Allergies  Allergen Reactions  . Codeine Other (See Comments)    Reaction not recalled  . Ezetimibe-Simvastatin Other (See Comments)    Muscle pain  . Naproxen Other (See Comments)    Reaction not recalled   . Niacin Other (See Comments)    Reaction not recalled  . Nsaids Other (See Comments)    Reaction not recalled  . Pravastatin Sodium Other (See Comments)    Muscle pain     Current Outpatient Medications  Medication Sig Dispense Refill  . acetaminophen (TYLENOL) 325 MG tablet Take 325-650 mg by mouth every 6 (six) hours as needed (for pain).     Marland Kitchen albuterol (PROVENTIL HFA;VENTOLIN HFA) 108 (90 Base) MCG/ACT inhaler Inhale 2 puffs into the lungs every 4 (four) hours as needed for wheezing or shortness of breath.     Marland Kitchen amLODipine (NORVASC) 5 MG tablet Take 1 tablet (5 mg total) by mouth daily. 180 tablet 3  . aspirin 81 MG tablet Take 1 tablet (81 mg total) by mouth daily. 30 tablet 0  . betaxolol (BETOPTIC-S) 0.5 % ophthalmic suspension Place 1 drop into both eyes every morning.     . bimatoprost (LUMIGAN) 0.01 % SOLN Place 1 drop into both eyes every morning.     . carvedilol (COREG) 12.5 MG tablet Take 12.5 mg by mouth 2 (two) times daily with a meal.     . Cranberry 500 MG CAPS Take 500 mg by mouth every evening.     . dorzolamide (TRUSOPT) 2 % ophthalmic solution Place 1 drop into both eyes 2 (two) times daily.     . fexofenadine (ALLEGRA) 180 MG tablet Take 180 mg by mouth daily.    Marland Kitchen  Fluticasone-Umeclidin-Vilant (TRELEGY ELLIPTA) 100-62.5-25 MCG/INH AEPB Inhale 1 puff into the lungs daily.    . glipiZIDE (GLUCOTROL XL) 5 MG 24 hr tablet Take 5 mg by mouth daily with breakfast.     . ipratropium-albuterol (DUONEB) 0.5-2.5 (3) MG/3ML SOLN Take 3 mLs by nebulization every 4 (four) hours as needed. 360 mL 0  . isosorbide mononitrate (IMDUR) 60 MG 24 hr tablet Take 1 tablet (60 mg total) by mouth 2 (two) times a day. 180 tablet 1  . Netarsudil Dimesylate (RHOPRESSA) 0.02 % SOLN Place 1 drop into the left eye at bedtime.     . oxybutynin (DITROPAN) 5 MG tablet Take 5 mg by mouth daily.     . pantoprazole (PROTONIX) 40 MG tablet Take 1 tablet (40 mg total) by mouth 2 (two) times daily. 60  tablet 0  . pilocarpine (PILOCAR) 2 % ophthalmic solution Place 1 drop into the left eye 4 (four) times daily.     . polyethylene glycol (MIRALAX / GLYCOLAX) packet Take 17 g by mouth daily. 14 each 0  . potassium chloride (K-DUR) 10 MEQ tablet Take 10 mEq by mouth daily. ONLY WHILE TAKING LASIX    . ranolazine (RANEXA) 500 MG 12 hr tablet Take 1 tablet (500 mg total) by mouth 2 (two) times daily. 60 tablet 3  . sitaGLIPtin (JANUVIA) 100 MG tablet Take 50 mg by mouth daily.    . tamsulosin (FLOMAX) 0.4 MG CAPS capsule Take 0.4 mg by mouth daily.    . torsemide (DEMADEX) 10 MG tablet Take 10 mg by mouth daily.     No current facility-administered medications for this visit.      Past Surgical History:  Procedure Laterality Date  . APPENDECTOMY    . CARPAL TUNNEL RELEASE     surgery left 05/05/2001  . COLONOSCOPY N/A 10/01/2017   Procedure: COLONOSCOPY;  Surgeon: Gupta, Rajesh, MD;  Location: [facility];  Service: Endoscopy;  Laterality: N/A;  . KNEE ARTHROSCOPY     bilaterial; unknown associated surgical procedure  . SHOULDER SURGERY     Right x2; third procedure on left  . TEE WITHOUT CARDIOVERSION N/A 07/29/2017   Procedure: TRANSESOPHAGEAL ECHOCARDIOGRAM (TEE) WITH PROPOFOL;  Surgeon: McDowell, Samuel G, MD;  Location: [facility];  Service: Cardiovascular;  Laterality: N/A;  . TONSILLECTOMY    . TRANSURETHRAL INCISION OF PROSTATE     resection of the prostate     Allergies  Allergen Reactions  . Codeine Other (See Comments)    Reaction not recalled  . Ezetimibe-Simvastatin Other (See Comments)    Muscle pain  . Naproxen Other (See Comments)    Reaction not recalled  . Niacin Other (See Comments)    Reaction not recalled  . Nsaids Other (See Comments)    Reaction not recalled  . Pravastatin Sodium Other (See Comments)    Muscle pain      Family History  Problem Relation Age of Onset  . Multiple sclerosis Mother   . Heart disease Father   . Peripheral vascular  disease Sister      Social History Mr. Soberano reports that he quit smoking about 49 years ago. His smoking use included cigarettes. He has a 40.00 pack-year smoking history. He has never used smokeless tobacco. Mr. Knodel reports no history of alcohol use.   Review of Systems CONSTITUTIONAL: No weight loss, fever, chills, weakness or fatigue.  HEENT: Eyes: No visual loss, blurred vision, double vision or yellow sclerae.No hearing loss, sneezing, congestion, runny nose   or sore throat.  SKIN: No rash or itching.  CARDIOVASCULAR: per hpi RESPIRATORY: per hpi GASTROINTESTINAL: No anorexia, nausea, vomiting or diarrhea. No abdominal pain or blood.  GENITOURINARY: No burning on urination, no polyuria NEUROLOGICAL: No headache, dizziness, syncope, paralysis, ataxia, numbness or tingling in the extremities. No change in bowel or bladder control.  MUSCULOSKELETAL: No muscle, back pain, joint pain or stiffness.  LYMPHATICS: No enlarged nodes. No history of splenectomy.  PSYCHIATRIC: No history of depression or anxiety.  ENDOCRINOLOGIC: No reports of sweating, cold or heat intolerance. No polyuria or polydipsia.  Marland Kitchen   Physical Examination Today's Vitals   12/14/18 1349  BP: (!) 142/76  Pulse: 80  SpO2: 96%  Weight: 205 lb (93 kg)  Height: _0  (1.676 m)   Body mass index is 33.09 kg/m.  Gen: resting comfortably, no acute distress HEENT: no scleral icterus, pupils equal round and reactive, no palptable cervical adenopathy,  CV: RRR, 3/6 systolic murmur rusb, no jvd Resp: Clear to auscultation bilaterally GI: abdomen is soft, non-tender, non-distended, normal bowel sounds, no hepatosplenomegaly MSK: extremities are warm, no edema.  Skin: warm, no rash Neuro:  no focal deficits Psych: appropriate affect   Diagnostic Studies  09/2012 stress echo:  Study Conclusions  - Stress ECG conclusions: There were no significant stress arrhythmias or conduction abnormalities; rare PVCs  were present during exercise and in recovery. The stress ECG was negative for ischemia. - Staged echo: There was no echocardiographic evidence for stress-induced ischemia.  09/2012 echo Study Conclusions  - Left ventricle: The cavity size was normal. Wall thickness was increased in a pattern of mild LVH. There was moderate focal basal hypertrophy of the septum. Systolic function was normal. The estimated ejection fraction was in the range of 55% to 60%. Wall motion was normal; there were no regional wall motion abnormalities. - Aortic valve: Mildly to moderately calcified annulus. Moderately thickened, moderately calcified leaflets. Valve mobility was mildly restricted. There was mild stenosis. Trivial regurgitation. Valve area: 0.9cm^2(VTI). Valve area: 0.79cm^2 (Vmax). - Aorta: The aorta was moderately calcified. - Mitral valve: Calcified annulus. Mildly calcified leaflets . - Atrial septum: No defect or patent foramen ovale was identified.  11/2004 Cath FINDINGS: 1. Patient does not have dextrocardia. The heart is normally located in  the left side of the chest. Aortic arch appears normal based on the  course of the catheter through it. 2. LV 167/19/25. 3. No aortic stenosis. 4. Left main: Angiographically normal. 5. LAD: Large vessel giving rise to a single, very large, diagonal. There  is a 50% stenosis of the ostium of the LAD. 6. Circumflex: Large vessel giving rise to three obtuse marginals. There  is a 60% stenosis of the mid vessel. 7. RCA: Tortuous, dominant vessel. It is angiographically normal.  IMPRESSION: 1. No dextrocardia. 2. No previously placed coronary stent. 3. Moderate nonobstructive coronary disease. 4. Elevated left ventricular end diastolic pressure in the setting of  systemic hypertension.  08/2014 echo Study Conclusions  - Left ventricle: The cavity size was  normal. There was mild focal basal hypertrophy of the septum. Systolic function was normal. The estimated ejection fraction was in the range of 55% to 60%. Wall motion was normal; there were no regional wall motion abnormalities. Doppler parameters are consistent with abnormal left ventricular relaxation (grade 1 diastolic dysfunction). - Aortic valve: Functionally bicuspid; moderately calcified leaflets. Cusp separation was reduced. There was moderate to severe stenosis. There was mild regurgitation. Mean gradient (S): 11 mm Hg. Peak gradient (  S): 22 mm Hg. VTI ratio of LVOT to aortic valve: 0.44. Valve area (VTI): 0.99 cm^2. - Mitral valve: Calcified annulus. There was trivial regurgitation. - Left atrium: The atrium was mildly dilated. - Right atrium: Central venous pressure (est): 8 mm Hg. - Tricuspid valve: There was trivial regurgitation. - Pulmonary arteries: Systolic pressure could not be accurately estimated. - Pericardium, extracardiac: There was no pericardial effusion.  Impressions:  - Mild basal septal hypertrophy with LVEF 55-60% and grade 1 diastolic dysfunction. Mild left atrial enlargement. Functionally bicuspid aortic valve with mild aortic regurgitation and moderate to severe aortic stenosis. Measures are somewhat discordant with relatively low gradients and LVOT/AV ratio 0.44, but planimetry and VTI valve area around 1 cm2. Unable to assess PASP.  01/2015 PFTs Mild to moderate ventilatory defect   08/2015 echo  Study Conclusions  - Left ventricle: The cavity size was normal. Wall thickness was  increased in a pattern of mild LVH. Systolic function was normal.  The estimated ejection fraction was in the range of 60% to 65%.  Wall motion was normal; there were no regional wall motion  abnormalities. Doppler parameters are consistent with abnormal  left ventricular relaxation (grade 1 diastolic dysfunction). - Aortic  valve: Mildly to moderately calcified annulus.  Functionally bicuspid. There was mild regurgitation. Mean  gradient (S): 10 mm Hg. Peak gradient (S): 20 mm Hg. VTI ratio of  LVOT to aortic valve: 0.39. Valve area (VTI): 0.88 cm^2. - Mitral valve: Calcified annulus. There was trivial regurgitation. - Left atrium: The atrium was at the upper limits of normal in  size. - Right atrium: Central venous pressure (est): 3 mm Hg. - Tricuspid valve: There was trivial regurgitation. - Pulmonary arteries: Systolic pressure could not be accurately  estimated. - Pericardium, extracardiac: There was no pericardial effusion.  Impressions:  - Mild LVH with LVEF 60-65%. Grade 1 diastolic dysfunction with  normal estimated LV filling pressure. Upper normal left atrial  chamber size. MAC with trivial mitral regurgitation. Functionally  bicuspid, calcified aortic valve with at least moderate aortic  stenosis. As mentioned in the prior study, measurements are  discordant with relatively low gradients but LVOT/AV VTI ratio  0.39. Unable to get good valve planimetry on this particular  study. There is mild aortic regurgitation.  07/2017 echo Study Conclusions  - Left ventricle: The cavity size was normal. Wall thickness was increased in a pattern of mild LVH. Systolic function was normal. The estimated ejection fraction was in the range of 55% to 60%. Wall motion was normal; there were no regional wall motion abnormalities. Doppler parameters are consistent with abnormal left ventricular relaxation (grade 1 diastolic dysfunction). - Aortic valve: Functionally bicuspid; moderately calcified leaflets. There was moderate to severe stenosis. There was mild regurgitation. Mean gradient (S): 11 mm Hg. Peak gradient (S): 20 mm Hg. VTI ratio of LVOT to aortic valve: 0.35. Valve area (VTI): 0.88 cm^2. - Mitral valve: Mildly calcified annulus. There was mild regurgitation.  There is an intermittently seen echodensity seen on the atrial side of the anterior mitral leaflet concerning for vegetation in patient with history of fever and stroke. - Left atrium: The atrium was mildly dilated. - Right atrium: Central venous pressure (est): 8 mm Hg. - Tricuspid valve: There was trivial regurgitation. - Pulmonary arteries: Systolic pressure could not be accurately estimated. - Pericardium, extracardiac: There was no pericardial effusion.  Impressions:  - There is an intermittently seen echodensity seen on the atrial side of the anterior mitral leaflet concerning  for vegetation in patient with history of fever and stroke.   07/2017 TEE Study Conclusions  - Left ventricle: Systolic function was normal. The estimated ejection fraction was in the range of 55% to 60%. Wall motion was normal; there were no regional wall motion abnormalities. - Aortic valve: Small, freely mobile echodensity seen intermittently on the ventricular side of the right coronary aortic cusp suggestive of vegetation given clinical scenario. There was moderate stenosis. Valve area approximately 1.4 cm2 by planimetry. There was mild regurgitation. - Aorta: Moderate to severe diffuse atherosclerosis present. - Mitral valve: Very small, freely mobile echodensity on the atrial side of the posterior mitral leaflet suggestive of vegetation given clinical scenario. The larger abnormality in region of anterior leaflet noted on the transthoracic study is not present - perhaps more reflective of artifact. Mildly calcified annulus. There was mild regurgitation. - Left atrium: The atrium was dilated. No evidence of thrombus in the atrial cavity or appendage. Emptying velocity was normal. - Right atrium: No evidence of thrombus in the atrial cavity or appendage. - Atrial septum: No significant defect or patent foramen ovale was identified by color Doppler or  agitated saline injection. - Tricuspid valve: There was trivial regurgitation. - Pulmonic valve: There was mild regurgitation. - Pericardium, extracardiac: There was no pericardial effusion.  Impressions:  - Very small, freely mobile echodensity on the atrial side of the posterior mitral leaflet suggestive of vegetation given clinical scenario. The larger abnormality in region of anterior leaflet noted on the transthoracic study is not present - perhaps more reflective of artifact. There is also a small, freely mobile echodensity seen intermittently on the ventricular side of the right coronary aortic cusp suggestive of vegetation given clinical scenario. No significant defect or patent foramen ovale was identified by color Doppler or agitated saline injection   10/2017 echo Study Conclusions  - Left ventricle: The cavity size was normal. Wall thickness was increased in a pattern of mild LVH. Systolic function was normal. The estimated ejection fraction was in the range of 55% to 60%. Wall motion was normal; there were no regional wall motion abnormalities. Doppler parameters are consistent with abnormal left ventricular relaxation (grade 1 diastolic dysfunction). - Aortic valve: Moderately calcified annulus. Probably trileaflet; mildly calcified leaflets. Valve mobility was restricted. There was moderate stenosis. There was mild regurgitation. Mean gradient (S): 12 mm Hg. VTI ratio of LVOT to aortic valve: 0.37. Valve area (VTI): 1.27 cm^2. - Mitral valve: Mildly calcified annulus. Mildly thickened, mildly calcified leaflets. There was mild regurgitation. - Right atrium: Central venous pressure (est): 3 mm Hg. - Atrial septum: No defect or patent foramen ovale was identified. - Tricuspid valve: There was trivial regurgitation. - Pulmonary arteries: PA peak pressure: 19 mm Hg (S). - Pericardium, extracardiac: There was no pericardial  effusion.  Impressions:  - No definitive valvular vegetations noted.   Assessment and Plan   1. CAD with other forms of angina -moderate disease by cath 2006 -recent chest pains concerning for angina. Due to advanced age, multiple comorbidities, and CKD 3 we have been reluctant to pursue cath initially and worked with medical therapy. He is on 4 antianginals, further titration limited by dizziness. Ongoing chest pains remain life limiting - we will plan to proceed with cath, we discussed in detail the risks with the patient and his daughter today including risk of renal failure given his CKD  - we will plan for cath next week, present early for IVFs   I have reviewed the  risks, indications, and alternatives to cardiac catheterization, possible angioplasty, and stenting with the patient and his wife today. Risks include but are not limited to bleeding, infection, vascular injury, stroke, myocardial infection, arrhythmia, kidney injury, radiation-related injury in the case of prolonged fluoroscopy use, emergency cardiac surgery, and death. The patient understands the risks of serious complication is 1-2 in 2800 with diagnostic cardiac cath and 1-2% or less with angioplasty/stenting.      [name], M.D.,

## 2018-12-14 NOTE — Progress Notes (Signed)
Clinical Summary [name] is a 85 y.o.male seen today for follow up of the following medical problems. This is a focused visit on recent symptoms of chest pain   1. CAD - cath 11/2004 with moderate non-obstructive disease - echo 09/2012 LVEF 55-60%  - over last several visits has had ongoing chest pain, we have titrated titrating and adding antianginal medical therapy.    since last visit ongoing chest pain. Occurs with walking from bathroom, overall mild exertion - pain has increased since adding ranexa. Intensity 7-10/10. Symptoms much worst one nigh he missed his meds. Spells usually last about 15 minutes.   2. Aortic stenosis - 08/2014 echo LVEF 55-60%, mod to severe AS (mean grad 11, AVA0.99) 08/2015 echo LVEF 92-33%, grade I diastolic dysfunction, mean grad 10, dimensionless index 0.39, AVA VTI 0.88 - 07/2017 echo mean grad 11, dimensionless index.35, AVA VTI 0.88 - 07/2017 TEE AVA planimetry 1.44 - 10/2017 TTE mean grad 12, AVA VTI 1.27 -10/2018 TTE: AVA VTI 0.81, dimensionless index 0.36, mean grad 12. Dimensionless index and gradient would suggest LVOT is underestimated at 1.7 cm, from prior studies reported at 2.1 cm.        Past Medical History:  Diagnosis Date  . Aortic valve stenosis   . ASCVD (arteriosclerotic cardiovascular disease)    50% ostial left left anterior desending 60% mid circumflex and normal ejection fractioon in 2006 exertional dyspnea;history of chest discomfort  . Cerebrovascular disease    minimal carotid bruits  . CKD (chronic kidney disease)   . Diabetes mellitus type 2, controlled ([place])    no insulin  . Hyperlipidemia    statin intolerant  . Hypertension   . Tobacco abuse, in remission    30 pack years stopped in 1971     Allergies  Allergen Reactions  . Codeine Other (See Comments)    Reaction not recalled  . Ezetimibe-Simvastatin Other (See Comments)    Muscle pain  . Naproxen Other (See Comments)    Reaction not recalled   . Niacin Other (See Comments)    Reaction not recalled  . Nsaids Other (See Comments)    Reaction not recalled  . Pravastatin Sodium Other (See Comments)    Muscle pain     Current Outpatient Medications  Medication Sig Dispense Refill  . acetaminophen (TYLENOL) 325 MG tablet Take 325-650 mg by mouth every 6 (six) hours as needed (for pain).     Marland Kitchen albuterol (PROVENTIL HFA;VENTOLIN HFA) 108 (90 Base) MCG/ACT inhaler Inhale 2 puffs into the lungs every 4 (four) hours as needed for wheezing or shortness of breath.     Marland Kitchen amLODipine (NORVASC) 5 MG tablet Take 1 tablet (5 mg total) by mouth daily. 180 tablet 3  . aspirin 81 MG tablet Take 1 tablet (81 mg total) by mouth daily. 30 tablet 0  . betaxolol (BETOPTIC-S) 0.5 % ophthalmic suspension Place 1 drop into both eyes every morning.     . bimatoprost (LUMIGAN) 0.01 % SOLN Place 1 drop into both eyes every morning.     . carvedilol (COREG) 12.5 MG tablet Take 12.5 mg by mouth 2 (two) times daily with a meal.     . Cranberry 500 MG CAPS Take 500 mg by mouth every evening.     . dorzolamide (TRUSOPT) 2 % ophthalmic solution Place 1 drop into both eyes 2 (two) times daily.     . fexofenadine (ALLEGRA) 180 MG tablet Take 180 mg by mouth daily.    Marland Kitchen  Fluticasone-Umeclidin-Vilant (TRELEGY ELLIPTA) 100-62.5-25 MCG/INH AEPB Inhale 1 puff into the lungs daily.    Marland Kitchen glipiZIDE (GLUCOTROL XL) 5 MG 24 hr tablet Take 5 mg by mouth daily with breakfast.     . ipratropium-albuterol (DUONEB) 0.5-2.5 (3) MG/3ML SOLN Take 3 mLs by nebulization every 4 (four) hours as needed. 360 mL 0  . isosorbide mononitrate (IMDUR) 60 MG 24 hr tablet Take 1 tablet (60 mg total) by mouth 2 (two) times a day. 180 tablet 1  . Netarsudil Dimesylate (RHOPRESSA) 0.02 % SOLN Place 1 drop into the left eye at bedtime.     Marland Kitchen oxybutynin (DITROPAN) 5 MG tablet Take 5 mg by mouth daily.     . pantoprazole (PROTONIX) 40 MG tablet Take 1 tablet (40 mg total) by mouth 2 (two) times daily. 60  tablet 0  . pilocarpine (PILOCAR) 2 % ophthalmic solution Place 1 drop into the left eye 4 (four) times daily.     . polyethylene glycol (MIRALAX / GLYCOLAX) packet Take 17 g by mouth daily. 14 each 0  . potassium chloride (K-DUR) 10 MEQ tablet Take 10 mEq by mouth daily. ONLY WHILE TAKING LASIX    . ranolazine (RANEXA) 500 MG 12 hr tablet Take 1 tablet (500 mg total) by mouth 2 (two) times daily. 60 tablet 3  . sitaGLIPtin (JANUVIA) 100 MG tablet Take 50 mg by mouth daily.    . tamsulosin (FLOMAX) 0.4 MG CAPS capsule Take 0.4 mg by mouth daily.    Marland Kitchen torsemide (DEMADEX) 10 MG tablet Take 10 mg by mouth daily.     No current facility-administered medications for this visit.      Past Surgical History:  Procedure Laterality Date  . APPENDECTOMY    . CARPAL TUNNEL RELEASE     surgery left 05/05/2001  . COLONOSCOPY N/A 10/01/2017   Procedure: COLONOSCOPY;  Surgeon: Jackquline Denmark, MD;  Location: [facility] ENDOSCOPY;  Service: Endoscopy;  Laterality: N/A;  . KNEE ARTHROSCOPY     bilaterial; unknown associated surgical procedure  . SHOULDER SURGERY     Right x2; third procedure on left  . TEE WITHOUT CARDIOVERSION N/A 07/29/2017   Procedure: TRANSESOPHAGEAL ECHOCARDIOGRAM (TEE) WITH PROPOFOL;  Surgeon: Satira Sark, MD;  Location: [facility] ENDO SUITE;  Service: Cardiovascular;  Laterality: N/A;  . TONSILLECTOMY    . TRANSURETHRAL INCISION OF PROSTATE     resection of the prostate     Allergies  Allergen Reactions  . Codeine Other (See Comments)    Reaction not recalled  . Ezetimibe-Simvastatin Other (See Comments)    Muscle pain  . Naproxen Other (See Comments)    Reaction not recalled  . Niacin Other (See Comments)    Reaction not recalled  . Nsaids Other (See Comments)    Reaction not recalled  . Pravastatin Sodium Other (See Comments)    Muscle pain      Family History  Problem Relation Age of Onset  . Multiple sclerosis Mother   . Heart disease Father   . Peripheral vascular  disease Sister      Social History Anthony Murray reports that he quit smoking about 49 years ago. His smoking use included cigarettes. He has a 40.00 pack-year smoking history. He has never used smokeless tobacco. Mr. Alderman reports no history of alcohol use.   Review of Systems CONSTITUTIONAL: No weight loss, fever, chills, weakness or fatigue.  HEENT: Eyes: No visual loss, blurred vision, double vision or yellow sclerae.No hearing loss, sneezing, congestion, runny nose  or sore throat.  SKIN: No rash or itching.  CARDIOVASCULAR: per hpi RESPIRATORY: per hpi GASTROINTESTINAL: No anorexia, nausea, vomiting or diarrhea. No abdominal pain or blood.  GENITOURINARY: No burning on urination, no polyuria NEUROLOGICAL: No headache, dizziness, syncope, paralysis, ataxia, numbness or tingling in the extremities. No change in bowel or bladder control.  MUSCULOSKELETAL: No muscle, back pain, joint pain or stiffness.  LYMPHATICS: No enlarged nodes. No history of splenectomy.  PSYCHIATRIC: No history of depression or anxiety.  ENDOCRINOLOGIC: No reports of sweating, cold or heat intolerance. No polyuria or polydipsia.  Marland Kitchen   Physical Examination Today's Vitals   12/14/18 1349  BP: (!) 142/76  Pulse: 80  SpO2: 96%  Weight: 205 lb (93 kg)  Height: _0  (1.676 m)   Body mass index is 33.09 kg/m.  Gen: resting comfortably, no acute distress HEENT: no scleral icterus, pupils equal round and reactive, no palptable cervical adenopathy,  CV: RRR, 3/6 systolic murmur rusb, no jvd Resp: Clear to auscultation bilaterally GI: abdomen is soft, non-tender, non-distended, normal bowel sounds, no hepatosplenomegaly MSK: extremities are warm, no edema.  Skin: warm, no rash Neuro:  no focal deficits Psych: appropriate affect   Diagnostic Studies  09/2012 stress echo:  Study Conclusions  - Stress ECG conclusions: There were no significant stress arrhythmias or conduction abnormalities; rare PVCs  were present during exercise and in recovery. The stress ECG was negative for ischemia. - Staged echo: There was no echocardiographic evidence for stress-induced ischemia.  09/2012 echo Study Conclusions  - Left ventricle: The cavity size was normal. Wall thickness was increased in a pattern of mild LVH. There was moderate focal basal hypertrophy of the septum. Systolic function was normal. The estimated ejection fraction was in the range of 55% to 60%. Wall motion was normal; there were no regional wall motion abnormalities. - Aortic valve: Mildly to moderately calcified annulus. Moderately thickened, moderately calcified leaflets. Valve mobility was mildly restricted. There was mild stenosis. Trivial regurgitation. Valve area: 0.9cm^2(VTI). Valve area: 0.79cm^2 (Vmax). - Aorta: The aorta was moderately calcified. - Mitral valve: Calcified annulus. Mildly calcified leaflets . - Atrial septum: No defect or patent foramen ovale was identified.  11/2004 Cath FINDINGS: 1. Patient does not have dextrocardia. The heart is normally located in  the left side of the chest. Aortic arch appears normal based on the  course of the catheter through it. 2. LV 167/19/25. 3. No aortic stenosis. 4. Left main: Angiographically normal. 5. LAD: Large vessel giving rise to a single, very large, diagonal. There  is a 50% stenosis of the ostium of the LAD. 6. Circumflex: Large vessel giving rise to three obtuse marginals. There  is a 60% stenosis of the mid vessel. 7. RCA: Tortuous, dominant vessel. It is angiographically normal.  IMPRESSION: 1. No dextrocardia. 2. No previously placed coronary stent. 3. Moderate nonobstructive coronary disease. 4. Elevated left ventricular end diastolic pressure in the setting of  systemic hypertension.  08/2014 echo Study Conclusions  - Left ventricle: The cavity size was  normal. There was mild focal basal hypertrophy of the septum. Systolic function was normal. The estimated ejection fraction was in the range of 55% to 60%. Wall motion was normal; there were no regional wall motion abnormalities. Doppler parameters are consistent with abnormal left ventricular relaxation (grade 1 diastolic dysfunction). - Aortic valve: Functionally bicuspid; moderately calcified leaflets. Cusp separation was reduced. There was moderate to severe stenosis. There was mild regurgitation. Mean gradient (S): 11 mm Hg. Peak gradient (  S): 22 mm Hg. VTI ratio of LVOT to aortic valve: 0.44. Valve area (VTI): 0.99 cm^2. - Mitral valve: Calcified annulus. There was trivial regurgitation. - Left atrium: The atrium was mildly dilated. - Right atrium: Central venous pressure (est): 8 mm Hg. - Tricuspid valve: There was trivial regurgitation. - Pulmonary arteries: Systolic pressure could not be accurately estimated. - Pericardium, extracardiac: There was no pericardial effusion.  Impressions:  - Mild basal septal hypertrophy with LVEF 55-60% and grade 1 diastolic dysfunction. Mild left atrial enlargement. Functionally bicuspid aortic valve with mild aortic regurgitation and moderate to severe aortic stenosis. Measures are somewhat discordant with relatively low gradients and LVOT/AV ratio 0.44, but planimetry and VTI valve area around 1 cm2. Unable to assess PASP.  01/2015 PFTs Mild to moderate ventilatory defect   08/2015 echo  Study Conclusions  - Left ventricle: The cavity size was normal. Wall thickness was  increased in a pattern of mild LVH. Systolic function was normal.  The estimated ejection fraction was in the range of 60% to 65%.  Wall motion was normal; there were no regional wall motion  abnormalities. Doppler parameters are consistent with abnormal  left ventricular relaxation (grade 1 diastolic dysfunction). - Aortic  valve: Mildly to moderately calcified annulus.  Functionally bicuspid. There was mild regurgitation. Mean  gradient (S): 10 mm Hg. Peak gradient (S): 20 mm Hg. VTI ratio of  LVOT to aortic valve: 0.39. Valve area (VTI): 0.88 cm^2. - Mitral valve: Calcified annulus. There was trivial regurgitation. - Left atrium: The atrium was at the upper limits of normal in  size. - Right atrium: Central venous pressure (est): 3 mm Hg. - Tricuspid valve: There was trivial regurgitation. - Pulmonary arteries: Systolic pressure could not be accurately  estimated. - Pericardium, extracardiac: There was no pericardial effusion.  Impressions:  - Mild LVH with LVEF 60-65%. Grade 1 diastolic dysfunction with  normal estimated LV filling pressure. Upper normal left atrial  chamber size. MAC with trivial mitral regurgitation. Functionally  bicuspid, calcified aortic valve with at least moderate aortic  stenosis. As mentioned in the prior study, measurements are  discordant with relatively low gradients but LVOT/AV VTI ratio  0.39. Unable to get good valve planimetry on this particular  study. There is mild aortic regurgitation.  07/2017 echo Study Conclusions  - Left ventricle: The cavity size was normal. Wall thickness was increased in a pattern of mild LVH. Systolic function was normal. The estimated ejection fraction was in the range of 55% to 60%. Wall motion was normal; there were no regional wall motion abnormalities. Doppler parameters are consistent with abnormal left ventricular relaxation (grade 1 diastolic dysfunction). - Aortic valve: Functionally bicuspid; moderately calcified leaflets. There was moderate to severe stenosis. There was mild regurgitation. Mean gradient (S): 11 mm Hg. Peak gradient (S): 20 mm Hg. VTI ratio of LVOT to aortic valve: 0.35. Valve area (VTI): 0.88 cm^2. - Mitral valve: Mildly calcified annulus. There was mild regurgitation.  There is an intermittently seen echodensity seen on the atrial side of the anterior mitral leaflet concerning for vegetation in patient with history of fever and stroke. - Left atrium: The atrium was mildly dilated. - Right atrium: Central venous pressure (est): 8 mm Hg. - Tricuspid valve: There was trivial regurgitation. - Pulmonary arteries: Systolic pressure could not be accurately estimated. - Pericardium, extracardiac: There was no pericardial effusion.  Impressions:  - There is an intermittently seen echodensity seen on the atrial side of the anterior mitral leaflet concerning  for vegetation in patient with history of fever and stroke.   07/2017 TEE Study Conclusions  - Left ventricle: Systolic function was normal. The estimated ejection fraction was in the range of 55% to 60%. Wall motion was normal; there were no regional wall motion abnormalities. - Aortic valve: Small, freely mobile echodensity seen intermittently on the ventricular side of the right coronary aortic cusp suggestive of vegetation given clinical scenario. There was moderate stenosis. Valve area approximately 1.4 cm2 by planimetry. There was mild regurgitation. - Aorta: Moderate to severe diffuse atherosclerosis present. - Mitral valve: Very small, freely mobile echodensity on the atrial side of the posterior mitral leaflet suggestive of vegetation given clinical scenario. The larger abnormality in region of anterior leaflet noted on the transthoracic study is not present - perhaps more reflective of artifact. Mildly calcified annulus. There was mild regurgitation. - Left atrium: The atrium was dilated. No evidence of thrombus in the atrial cavity or appendage. Emptying velocity was normal. - Right atrium: No evidence of thrombus in the atrial cavity or appendage. - Atrial septum: No significant defect or patent foramen ovale was identified by color Doppler or  agitated saline injection. - Tricuspid valve: There was trivial regurgitation. - Pulmonic valve: There was mild regurgitation. - Pericardium, extracardiac: There was no pericardial effusion.  Impressions:  - Very small, freely mobile echodensity on the atrial side of the posterior mitral leaflet suggestive of vegetation given clinical scenario. The larger abnormality in region of anterior leaflet noted on the transthoracic study is not present - perhaps more reflective of artifact. There is also a small, freely mobile echodensity seen intermittently on the ventricular side of the right coronary aortic cusp suggestive of vegetation given clinical scenario. No significant defect or patent foramen ovale was identified by color Doppler or agitated saline injection   10/2017 echo Study Conclusions  - Left ventricle: The cavity size was normal. Wall thickness was increased in a pattern of mild LVH. Systolic function was normal. The estimated ejection fraction was in the range of 55% to 60%. Wall motion was normal; there were no regional wall motion abnormalities. Doppler parameters are consistent with abnormal left ventricular relaxation (grade 1 diastolic dysfunction). - Aortic valve: Moderately calcified annulus. Probably trileaflet; mildly calcified leaflets. Valve mobility was restricted. There was moderate stenosis. There was mild regurgitation. Mean gradient (S): 12 mm Hg. VTI ratio of LVOT to aortic valve: 0.37. Valve area (VTI): 1.27 cm^2. - Mitral valve: Mildly calcified annulus. Mildly thickened, mildly calcified leaflets. There was mild regurgitation. - Right atrium: Central venous pressure (est): 3 mm Hg. - Atrial septum: No defect or patent foramen ovale was identified. - Tricuspid valve: There was trivial regurgitation. - Pulmonary arteries: PA peak pressure: 19 mm Hg (S). - Pericardium, extracardiac: There was no pericardial  effusion.  Impressions:  - No definitive valvular vegetations noted.   Assessment and Plan   1. CAD with other forms of angina -moderate disease by cath 2006 -recent chest pains concerning for angina. Due to advanced age, multiple comorbidities, and CKD 3 we have been reluctant to pursue cath initially and worked with medical therapy. He is on 4 antianginals, further titration limited by dizziness. Ongoing chest pains remain life limiting - we will plan to proceed with cath, we discussed in detail the risks with the patient and his daughter today including risk of renal failure given his CKD  - we will plan for cath next week, present early for IVFs   I have reviewed the  risks, indications, and alternatives to cardiac catheterization, possible angioplasty, and stenting with the patient and his wife today. Risks include but are not limited to bleeding, infection, vascular injury, stroke, myocardial infection, arrhythmia, kidney injury, radiation-related injury in the case of prolonged fluoroscopy use, emergency cardiac surgery, and death. The patient understands the risks of serious complication is 1-2 in 2800 with diagnostic cardiac cath and 1-2% or less with angioplasty/stenting.      [name], M.D.,

## 2018-12-15 ENCOUNTER — Other Ambulatory Visit: Payer: Self-pay | Admitting: *Deleted

## 2018-12-15 ENCOUNTER — Other Ambulatory Visit (HOSPITAL_COMMUNITY)
Admission: RE | Admit: 2018-12-15 | Discharge: 2018-12-15 | Disposition: A | Discharge status: Home / Self Care | Payer: PPO | Source: Ambulatory Visit | Attending: Cardiovascular Disease | Admitting: Cardiovascular Disease

## 2018-12-15 DIAGNOSIS — Z1159 Encounter for screening for other viral diseases: Secondary | ICD-10-CM | POA: Diagnosis not present

## 2018-12-16 LAB — NOVEL CORONAVIRUS, NAA (HOSP ORDER, SEND-OUT TO REF LAB; TAT 18-24 HRS): SARS-CoV-2, NAA: NOT DETECTED

## 2018-12-18 ENCOUNTER — Telehealth: Payer: Self-pay | Admitting: *Deleted

## 2018-12-18 NOTE — Telephone Encounter (Signed)
Pt contacted pre-catheterization scheduled at [facility] for: Tuesday December 19, 2018 10:30 AM Verified arrival time and place: [place] Entrance A at: 5:30 AM-pre procedure hydration  Covid-19 test date:12/15/18  No solid food after midnight prior to cath, clear liquids until 5 AM day of procedure. Contrast allergy: no  Hold: Torsemide-day before and day of procedure-GFR 34-already taken 12/18/18 KCl-day before and day of procedure-already taken 12/18/18 [place] of procedure. Glipizide-day of procedure.  Except hold medications AM meds can be  taken pre-cath with sip of water including: ASA 81 mg   Confirm patient has responsible person to drive home post procedure and observe 24 hours after arriving home: yes  Due to Covid-19 pandemic no visitors are allowed in the hospital (unless cognitive impairment).  Their designated party will be called when their procedure is over for an update and to arrange pick up.  Patients are required to wear a mask when they enter the hospital.        COVID-19 Pre-Screening Questions:  . In the past 7 to 10 days have you had a cough,  shortness of breath, headache, congestion, fever (100 or greater) body aches, chills, sore throat, or sudden loss of taste or sense of smell? no . Have you been around anyone with known Covid 19? no . Have you been around anyone who is awaiting Covid 19 test results in the past 7 to 10 days? no . Have you been around anyone who has been exposed to Covid 19, or has mentioned symptoms of Covid 19 within the past 7 to 10 days? no       I reviewed procedure instructions/mask/visitor/Covid-19 screening questions with Rolla Etienne ([facility]), she verbalzied understanding, thanked me for call.

## 2018-12-19 ENCOUNTER — Ambulatory Visit (HOSPITAL_COMMUNITY)
Admission: RE | Admit: 2018-12-19 | Discharge: 2018-12-19 | Disposition: A | Discharge status: Home / Self Care | Payer: PPO | Source: Ambulatory Visit | Attending: Cardiovascular Disease | Admitting: Cardiovascular Disease

## 2018-12-19 ENCOUNTER — Encounter (HOSPITAL_COMMUNITY)
Admission: RE | Disposition: A | Discharge status: Home / Self Care | Payer: Self-pay | Source: Ambulatory Visit | Attending: Cardiovascular Disease

## 2018-12-19 ENCOUNTER — Other Ambulatory Visit: Payer: Self-pay

## 2018-12-19 DIAGNOSIS — Z79899 Other long term (current) drug therapy: Secondary | ICD-10-CM | POA: Diagnosis not present

## 2018-12-19 DIAGNOSIS — Z8249 Family history of ischemic heart disease and other diseases of the circulatory system: Secondary | ICD-10-CM | POA: Diagnosis not present

## 2018-12-19 DIAGNOSIS — Z885 Allergy status to narcotic agent status: Secondary | ICD-10-CM | POA: Insufficient documentation

## 2018-12-19 DIAGNOSIS — Z7984 Long term (current) use of oral hypoglycemic drugs: Secondary | ICD-10-CM | POA: Insufficient documentation

## 2018-12-19 DIAGNOSIS — Z87891 Personal history of nicotine dependence: Secondary | ICD-10-CM | POA: Diagnosis not present

## 2018-12-19 DIAGNOSIS — E1122 Type 2 diabetes mellitus with diabetic chronic kidney disease: Secondary | ICD-10-CM | POA: Diagnosis not present

## 2018-12-19 DIAGNOSIS — N183 Chronic kidney disease, stage 3 (moderate): Secondary | ICD-10-CM | POA: Insufficient documentation

## 2018-12-19 DIAGNOSIS — Z7951 Long term (current) use of inhaled steroids: Secondary | ICD-10-CM | POA: Insufficient documentation

## 2018-12-19 DIAGNOSIS — I129 Hypertensive chronic kidney disease with stage 1 through stage 4 chronic kidney disease, or unspecified chronic kidney disease: Secondary | ICD-10-CM | POA: Insufficient documentation

## 2018-12-19 DIAGNOSIS — I350 Nonrheumatic aortic (valve) stenosis: Secondary | ICD-10-CM | POA: Diagnosis not present

## 2018-12-19 DIAGNOSIS — I209 Angina pectoris, unspecified: Secondary | ICD-10-CM

## 2018-12-19 DIAGNOSIS — R079 Chest pain, unspecified: Secondary | ICD-10-CM

## 2018-12-19 DIAGNOSIS — Z8673 Personal history of transient ischemic attack (TIA), and cerebral infarction without residual deficits: Secondary | ICD-10-CM | POA: Diagnosis not present

## 2018-12-19 DIAGNOSIS — Z7982 Long term (current) use of aspirin: Secondary | ICD-10-CM | POA: Diagnosis not present

## 2018-12-19 DIAGNOSIS — E785 Hyperlipidemia, unspecified: Secondary | ICD-10-CM | POA: Diagnosis not present

## 2018-12-19 DIAGNOSIS — I25119 Atherosclerotic heart disease of native coronary artery with unspecified angina pectoris: Secondary | ICD-10-CM | POA: Diagnosis not present

## 2018-12-19 DIAGNOSIS — Z886 Allergy status to analgesic agent status: Secondary | ICD-10-CM | POA: Insufficient documentation

## 2018-12-19 HISTORY — PX: LEFT HEART CATH AND CORONARY ANGIOGRAPHY: CATH118249

## 2018-12-19 LAB — GLUCOSE, CAPILLARY
Glucose-Capillary: 126 mg/dL — ABNORMAL HIGH (ref 70–99)
Glucose-Capillary: 115 mg/dL — ABNORMAL HIGH (ref 70–99)

## 2018-12-19 SURGERY — LEFT HEART CATH AND CORONARY ANGIOGRAPHY
Anesthesia: LOCAL

## 2018-12-19 MED ORDER — ACETAMINOPHEN 325 MG PO TABS: 650.0000 mg | ORAL_TABLET | ORAL | Status: DC | PRN

## 2018-12-19 MED ORDER — LIDOCAINE HCL (PF) 1 % IJ SOLN
INTRAMUSCULAR | Status: AC
  Filled 2018-12-19: qty 30

## 2018-12-19 MED ORDER — ASPIRIN 81 MG PO CHEW: 81.0000 mg | CHEWABLE_TABLET | ORAL | Status: DC

## 2018-12-19 MED ORDER — SODIUM CHLORIDE 0.9% FLUSH: 3.0000 mL | INTRAVENOUS | Status: DC | PRN

## 2018-12-19 MED ORDER — HEPARIN SODIUM (PORCINE) 1000 UNIT/ML IJ SOLN
INTRAMUSCULAR | Status: AC
  Filled 2018-12-19: qty 1

## 2018-12-19 MED ORDER — HEPARIN (PORCINE) IN NACL 1000-0.9 UT/500ML-% IV SOLN
INTRAVENOUS | Status: AC
  Filled 2018-12-19: qty 1000

## 2018-12-19 MED ORDER — IOHEXOL 350 MG/ML SOLN
INTRAVENOUS | Status: DC | PRN
  Administered 2018-12-19: 11:00:00 40 mL via INTRA_ARTERIAL

## 2018-12-19 MED ORDER — SODIUM CHLORIDE 0.9 % WEIGHT BASED INFUSION: 1.0000 mL/kg/h | INTRAVENOUS | Status: DC

## 2018-12-19 MED ORDER — LIDOCAINE HCL (PF) 1 % IJ SOLN
INTRAMUSCULAR | Status: DC | PRN
  Administered 2018-12-19: 2 mL

## 2018-12-19 MED ORDER — SODIUM CHLORIDE 0.9% FLUSH: 3.0000 mL | Freq: Two times a day (BID) | INTRAVENOUS | Status: DC

## 2018-12-19 MED ORDER — LABETALOL HCL 5 MG/ML IV SOLN: 10.0000 mg | INTRAVENOUS | Status: DC | PRN

## 2018-12-19 MED ORDER — HYDRALAZINE HCL 20 MG/ML IJ SOLN: 10.0000 mg | INTRAMUSCULAR | Status: DC | PRN

## 2018-12-19 MED ORDER — SODIUM CHLORIDE 0.9 % IV SOLN: 250.0000 mL | INTRAVENOUS | Status: DC | PRN

## 2018-12-19 MED ORDER — HEPARIN (PORCINE) IN NACL 1000-0.9 UT/500ML-% IV SOLN
INTRAVENOUS | Status: DC | PRN
  Administered 2018-12-19 (×2): 500 mL

## 2018-12-19 MED ORDER — DIAZEPAM 2 MG PO TABS: 2.0000 mg | ORAL_TABLET | Freq: Four times a day (QID) | ORAL | Status: DC | PRN

## 2018-12-19 MED ORDER — SODIUM CHLORIDE 0.9 % IV SOLN: INTRAVENOUS | Status: DC

## 2018-12-19 MED ORDER — MIDAZOLAM HCL 2 MG/2ML IJ SOLN
INTRAMUSCULAR | Status: AC
  Filled 2018-12-19: qty 2

## 2018-12-19 MED ORDER — MIDAZOLAM HCL 2 MG/2ML IJ SOLN
INTRAMUSCULAR | Status: DC | PRN
  Administered 2018-12-19: 1 mg via INTRAVENOUS

## 2018-12-19 MED ORDER — VERAPAMIL HCL 2.5 MG/ML IV SOLN
INTRAVENOUS | Status: DC | PRN
  Administered 2018-12-19: 10 mL via INTRA_ARTERIAL

## 2018-12-19 MED ORDER — ONDANSETRON HCL 4 MG/2ML IJ SOLN: 4.0000 mg | Freq: Four times a day (QID) | INTRAMUSCULAR | Status: DC | PRN

## 2018-12-19 MED ORDER — HEPARIN SODIUM (PORCINE) 1000 UNIT/ML IJ SOLN
INTRAMUSCULAR | Status: DC | PRN
  Administered 2018-12-19: 4500 [IU] via INTRAVENOUS

## 2018-12-19 MED ORDER — SODIUM CHLORIDE 0.9 % WEIGHT BASED INFUSION
3.0000 mL/kg/h | INTRAVENOUS | Status: AC
  Administered 2018-12-19: 07:00:00 3 mL/kg/h via INTRAVENOUS

## 2018-12-19 MED ORDER — VERAPAMIL HCL 2.5 MG/ML IV SOLN
INTRAVENOUS | Status: AC
  Filled 2018-12-19: qty 2

## 2018-12-19 SURGICAL SUPPLY — 11 items
BAG SNAP BAND KOVER 36X36 (MISCELLANEOUS) ×1 IMPLANT
CATH OPTITORQUE TIG 4.0 5F (CATHETERS) ×1 IMPLANT
COVER DOME SNAP 22 D (MISCELLANEOUS) ×1 IMPLANT
DEVICE RAD COMP TR BAND LRG (VASCULAR PRODUCTS) ×1 IMPLANT
GLIDESHEATH SLEND SS 6F .021 (SHEATH) ×1 IMPLANT
GUIDEWIRE INQWIRE 1.5J.035X260 (WIRE) IMPLANT
INQWIRE 1.5J .035X260CM (WIRE) ×2
KIT HEART LEFT (KITS) ×2 IMPLANT
PACK CARDIAC CATHETERIZATION (CUSTOM PROCEDURE TRAY) ×2 IMPLANT
TRANSDUCER W/STOPCOCK (MISCELLANEOUS) ×2 IMPLANT
TUBING CIL FLEX 10 FLL-RA (TUBING) ×2 IMPLANT

## 2018-12-19 NOTE — Discharge Instructions (Signed)
Radial Site Care ° °This sheet gives you information about how to care for yourself after your procedure. Your health care provider may also give you more specific instructions. If you have problems or questions, contact your health care provider. °What can I expect after the procedure? °After the procedure, it is common to have: °· Bruising and tenderness at the catheter insertion area. °Follow these instructions at home: °Medicines °· Take over-the-counter and prescription medicines only as told by your health care provider. °Insertion site care °· Follow instructions from your health care provider about how to take care of your insertion site. Make sure you: °? Wash your hands with soap and water before you change your bandage (dressing). If soap and water are not available, use hand sanitizer. °? Change your dressing as told by your health care provider. °? Leave stitches (sutures), skin glue, or adhesive strips in place. These skin closures may need to stay in place for 2 weeks or longer. If adhesive strip edges start to loosen and curl up, you may trim the loose edges. Do not remove adhesive strips completely unless your health care provider tells you to do that. °· Check your insertion site every day for signs of infection. Check for: °? Redness, swelling, or pain. °? Fluid or blood. °? Pus or a bad smell. °? Warmth. °· Do not take baths, swim, or use a hot tub until your health care provider approves. °· You may shower 24-48 hours after the procedure, or as directed by your health care provider. °? Remove the dressing and gently wash the site with plain soap and water. °? Pat the area dry with a clean towel. °? Do not rub the site. That could cause bleeding. °· Do not apply powder or lotion to the site. °Activity ° °· For 24 hours after the procedure, or as directed by your health care provider: °? Do not flex or bend the affected arm. °? Do not push or pull heavy objects with the affected arm. °? Do not  drive yourself home from the hospital or clinic. You may drive 24 hours after the procedure unless your health care provider tells you not to. °? Do not operate machinery or power tools. °· Do not lift anything that is heavier than 10 lb (4.5 kg), or the limit that you are told, until your health care provider says that it is safe. °· Ask your health care provider when it is okay to: °? Return to work or school. °? Resume usual physical activities or sports. °? Resume sexual activity. °General instructions °· If the catheter site starts to bleed, raise your arm and put firm pressure on the site. If the bleeding does not stop, get help right away. This is a medical emergency. °· If you went home on the same day as your procedure, a responsible adult should be with you for the first 24 hours after you arrive home. °· Keep all follow-up visits as told by your health care provider. This is important. °Contact a health care provider if: °· You have a fever. °· You have redness, swelling, or yellow drainage around your insertion site. °Get help right away if: °· You have unusual pain at the radial site. °· The catheter insertion area swells very fast. °· The insertion area is bleeding, and the bleeding does not stop when you hold steady pressure on the area. °· Your arm or hand becomes pale, cool, tingly, or numb. °These symptoms may represent a serious problem   that is an emergency. Do not wait to see if the symptoms will go away. Get medical help right away. Call your local emergency services (911 in the [place] Do not drive yourself to the hospital. °Summary °· After the procedure, it is common to have bruising and tenderness at the site. °· Follow instructions from your health care provider about how to take care of your radial site wound. Check the wound every day for signs of infection. °· Do not lift anything that is heavier than 10 lb (4.5 kg), or the limit that you are told, until your health care provider says  that it is safe. °This information is not intended to replace advice given to you by your health care provider. Make sure you discuss any questions you have with your health care provider. °Document Released: 07/10/2010 Document Revised: 07/13/2017 Document Reviewed: 07/13/2017 °Elsevier Patient Education © 2020 [employer] ° °

## 2018-12-19 NOTE — Interval H&P Note (Signed)
Cath Lab Visit (complete for each Cath Lab visit)  Clinical Evaluation Leading to the Procedure:   ACS: No.  Non-ACS:    Anginal Classification: CCS III  Anti-ischemic medical therapy: Maximal Therapy (2 or more classes of medications)  Non-Invasive Test Results: No non-invasive testing performed  Prior CABG: No previous CABG      History and Physical Interval Note:  12/19/2018 10:32 AM  Anthony Murray  has presented today for surgery, with the diagnosis of chest pain.  The various methods of treatment have been discussed with the patient and family. After consideration of risks, benefits and other options for treatment, the patient has consented to  Procedure(s): LEFT HEART CATH AND CORONARY ANGIOGRAPHY (N/A) as a surgical intervention.  The patient's history has been reviewed, patient examined, no change in status, stable for surgery.  I have reviewed the patient's chart and labs.  Questions were answered to the patient's satisfaction.     Shelva Majestic

## 2018-12-20 ENCOUNTER — Encounter (HOSPITAL_COMMUNITY): Payer: Self-pay | Admitting: Cardiovascular Disease

## 2018-12-22 ENCOUNTER — Other Ambulatory Visit: Payer: Self-pay | Admitting: Cardiology

## 2018-12-25 ENCOUNTER — Telehealth (INDEPENDENT_AMBULATORY_CARE_PROVIDER_SITE_OTHER): Payer: PPO | Admitting: Cardiology

## 2018-12-25 DIAGNOSIS — I25110 Atherosclerotic heart disease of native coronary artery with unstable angina pectoris: Secondary | ICD-10-CM

## 2018-12-25 NOTE — Progress Notes (Signed)
Virtual Visit via Telephone Note   This visit type was conducted due to national recommendations for restrictions regarding the COVID-19 Pandemic (e.g. social distancing) in an effort to limit this patient's exposure and mitigate transmission in our community.  Due to his co-morbid illnesses, this patient is at least at moderate risk for complications without adequate follow up.  This format is felt to be most appropriate for this patient at this time.  The patient did not have access to video technology/had technical difficulties with video requiring transitioning to audio format only (telephone).  All issues noted in this document were discussed and addressed.  No physical exam could be performed with this format.  Please refer to the patient's chart for his  consent to telehealth for [facility].   Date:  12/25/2018   ID:  Anthony Murray, DOB [birth year], MRN [id number]  Patient Location: Home Provider Location: Office  PCP:  [name], MD  Cardiologist:  [name], MD  Electrophysiologist:  None   Evaluation Performed:  Follow-Up Visit  Chief Complaint:  CHest pain  History of Present Illness:    SYMIR Murray is a 85 y.o. male seen today for follow up of the following medical problems. This is a focused visit after recent cath for chest pain.      1. CAD - cath 11/2004 with moderate non-obstructive disease - echo 09/2012 LVEF 55-60%  - over last several visits has had ongoing chest pain, we have titrated titrating and adding antianginal medical therapy.    since last visit ongoing chest pain. Occurs with walking from bathroom, overall mild exertion - pain has increased since adding ranexa. Intensity 7-10/10. Symptoms much worst one nigh he missed his meds. Spells usually last about 15 minutes.    11/2018 cath: LM 20%, prox LAD 85%, D1 50%, prox LCX 90%, mid LCX 80%, OM1 95%, RCA prox 70% - essentially surgical anatomy.  - isolated chest pain episode since  cath.     2. Aortic stenosis - 08/2014 echo LVEF 55-60%, mod to severe AS (mean grad 11, AVA0.99) 08/2015 echo LVEF 83-38%, grade I diastolic dysfunction, mean grad 10, dimensionless index 0.39, AVA VTI 0.88 - 07/2017 echo mean grad 11, dimensionless index.35, AVA VTI 0.88 - 07/2017 TEE AVA planimetry 1.44 - 10/2017 TTE mean grad 12, AVA VTI 1.27 -10/2018 TTE: AVA VTI 0.81, dimensionless index 0.36, mean grad 12. Dimensionless index and gradient would suggest LVOT is underestimated at 1.7 cm, from prior studies reported at 2.1 cm.       The patient does not have symptoms concerning for COVID-19 infection (fever, chills, cough, or new shortness of breath).    Past Medical History:  Diagnosis Date  . Aortic valve stenosis   . ASCVD (arteriosclerotic cardiovascular disease)    50% ostial left left anterior desending 60% mid circumflex and normal ejection fractioon in 2006 exertional dyspnea;history of chest discomfort  . Cerebrovascular disease    minimal carotid bruits  . CKD (chronic kidney disease)   . Diabetes mellitus type 2, controlled ([place])    no insulin  . Hyperlipidemia    statin intolerant  . Hypertension   . Tobacco abuse, in remission    30 pack years stopped in 1971   Past Surgical History:  Procedure Laterality Date  . APPENDECTOMY    . CARPAL TUNNEL RELEASE     surgery left 05/05/2001  . COLONOSCOPY N/A 10/01/2017   Procedure: COLONOSCOPY;  Surgeon: Jackquline Denmark, MD;  Location: [facility]  ENDOSCOPY;  Service: Endoscopy;  Laterality: N/A;  . KNEE ARTHROSCOPY     bilaterial; unknown associated surgical procedure  . LEFT HEART CATH AND CORONARY ANGIOGRAPHY N/A 12/19/2018   Procedure: LEFT HEART CATH AND CORONARY ANGIOGRAPHY;  Surgeon: Troy Sine, MD;  Location: [facility];  Service: Cardiovascular;  Laterality: N/A;  . SHOULDER SURGERY     Right x2; third procedure on left  . TEE WITHOUT CARDIOVERSION N/A 07/29/2017   Procedure: TRANSESOPHAGEAL ECHOCARDIOGRAM  (TEE) WITH PROPOFOL;  Surgeon: Satira Sark, MD;  Location: [facility];  Service: Cardiovascular;  Laterality: N/A;  . TONSILLECTOMY    . TRANSURETHRAL INCISION OF PROSTATE     resection of the prostate     No outpatient medications have been marked as taking for the 12/25/18 encounter (Appointment) with [name], MD.     Allergies:   Codeine, Ezetimibe-simvastatin, Naproxen, Niacin, Nsaids, and Pravastatin sodium   Social History   Tobacco Use  . Smoking status: Former Smoker    Packs/day: 1.00    Years: 40.00    Pack years: 40.00    Types: Cigarettes    Quit date: 10/29/1969    Years since quitting: 49.1  . Smokeless tobacco: Never Used  Substance Use Topics  . Alcohol use: No    Alcohol/week: 0.0 standard drinks  . Drug use: No     Family Hx: The patient's family history includes Heart disease in his father; Multiple sclerosis in his mother; Peripheral vascular disease in his sister.  ROS:   Please see the history of present illness.     All other systems reviewed and are negative.   Prior CV studies:   The following studies were reviewed today:  09/2012 stress echo:  Study Conclusions  - Stress ECG conclusions: There were no significant stress arrhythmias or conduction abnormalities; rare PVCs were present during exercise and in recovery. The stress ECG was negative for ischemia. - Staged echo: There was no echocardiographic evidence for stress-induced ischemia.  09/2012 echo Study Conclusions  - Left ventricle: The cavity size was normal. Wall thickness was increased in a pattern of mild LVH. There was moderate focal basal hypertrophy of the septum. Systolic function was normal. The estimated ejection fraction was in the range of 55% to 60%. Wall motion was normal; there were no regional wall motion abnormalities. - Aortic valve: Mildly to moderately calcified annulus. Moderately thickened, moderately calcified  leaflets. Valve mobility was mildly restricted. There was mild stenosis. Trivial regurgitation. Valve area: 0.9cm^2(VTI). Valve area: 0.79cm^2 (Vmax). - Aorta: The aorta was moderately calcified. - Mitral valve: Calcified annulus. Mildly calcified leaflets . - Atrial septum: No defect or patent foramen ovale was identified.  11/2004 Cath FINDINGS: 1. Patient does not have dextrocardia. The heart is normally located in  the left side of the chest. Aortic arch appears normal based on the  course of the catheter through it. 2. LV 167/19/25. 3. No aortic stenosis. 4. Left main: Angiographically normal. 5. LAD: Large vessel giving rise to a single, very large, diagonal. There  is a 50% stenosis of the ostium of the LAD. 6. Circumflex: Large vessel giving rise to three obtuse marginals. There  is a 60% stenosis of the mid vessel. 7. RCA: Tortuous, dominant vessel. It is angiographically normal.  IMPRESSION: 1. No dextrocardia. 2. No previously placed coronary stent. 3. Moderate nonobstructive coronary disease. 4. Elevated left ventricular end diastolic pressure in the setting of  systemic hypertension.  08/2014 echo Study  Conclusions  - Left ventricle: The cavity size was normal. There was mild focal basal hypertrophy of the septum. Systolic function was normal. The estimated ejection fraction was in the range of 55% to 60%. Wall motion was normal; there were no regional wall motion abnormalities. Doppler parameters are consistent with abnormal left ventricular relaxation (grade 1 diastolic dysfunction). - Aortic valve: Functionally bicuspid; moderately calcified leaflets. Cusp separation was reduced. There was moderate to severe stenosis. There was mild regurgitation. Mean gradient (S): 11 mm Hg. Peak gradient (S): 22 mm Hg. VTI ratio of LVOT to aortic valve: 0.44. Valve area (VTI): 0.99 cm^2. -  Mitral valve: Calcified annulus. There was trivial regurgitation. - Left atrium: The atrium was mildly dilated. - Right atrium: Central venous pressure (est): 8 mm Hg. - Tricuspid valve: There was trivial regurgitation. - Pulmonary arteries: Systolic pressure could not be accurately estimated. - Pericardium, extracardiac: There was no pericardial effusion.  Impressions:  - Mild basal septal hypertrophy with LVEF 55-60% and grade 1 diastolic dysfunction. Mild left atrial enlargement. Functionally bicuspid aortic valve with mild aortic regurgitation and moderate to severe aortic stenosis. Measures are somewhat discordant with relatively low gradients and LVOT/AV ratio 0.44, but planimetry and VTI valve area around 1 cm2. Unable to assess PASP.  01/2015 PFTs Mild to moderate ventilatory defect   08/2015 echo  Study Conclusions  - Left ventricle: The cavity size was normal. Wall thickness was  increased in a pattern of mild LVH. Systolic function was normal.  The estimated ejection fraction was in the range of 60% to 65%.  Wall motion was normal; there were no regional wall motion  abnormalities. Doppler parameters are consistent with abnormal  left ventricular relaxation (grade 1 diastolic dysfunction). - Aortic valve: Mildly to moderately calcified annulus.  Functionally bicuspid. There was mild regurgitation. Mean  gradient (S): 10 mm Hg. Peak gradient (S): 20 mm Hg. VTI ratio of  LVOT to aortic valve: 0.39. Valve area (VTI): 0.88 cm^2. - Mitral valve: Calcified annulus. There was trivial regurgitation. - Left atrium: The atrium was at the upper limits of normal in  size. - Right atrium: Central venous pressure (est): 3 mm Hg. - Tricuspid valve: There was trivial regurgitation. - Pulmonary arteries: Systolic pressure could not be accurately  estimated. - Pericardium, extracardiac: There was no pericardial effusion.  Impressions:  - Mild LVH  with LVEF 60-65%. Grade 1 diastolic dysfunction with  normal estimated LV filling pressure. Upper normal left atrial  chamber size. MAC with trivial mitral regurgitation. Functionally  bicuspid, calcified aortic valve with at least moderate aortic  stenosis. As mentioned in the prior study, measurements are  discordant with relatively low gradients but LVOT/AV VTI ratio  0.39. Unable to get good valve planimetry on this particular  study. There is mild aortic regurgitation.  07/2017 echo Study Conclusions  - Left ventricle: The cavity size was normal. Wall thickness was increased in a pattern of mild LVH. Systolic function was normal. The estimated ejection fraction was in the range of 55% to 60%. Wall motion was normal; there were no regional wall motion abnormalities. Doppler parameters are consistent with abnormal left ventricular relaxation (grade 1 diastolic dysfunction). - Aortic valve: Functionally bicuspid; moderately calcified leaflets. There was moderate to severe stenosis. There was mild regurgitation. Mean gradient (S): 11 mm Hg. Peak gradient (S): 20 mm Hg. VTI ratio of LVOT to aortic valve: 0.35. Valve area (VTI): 0.88 cm^2. - Mitral valve: Mildly calcified annulus. There was mild regurgitation. There is  an intermittently seen echodensity seen on the atrial side of the anterior mitral leaflet concerning for vegetation in patient with history of fever and stroke. - Left atrium: The atrium was mildly dilated. - Right atrium: Central venous pressure (est): 8 mm Hg. - Tricuspid valve: There was trivial regurgitation. - Pulmonary arteries: Systolic pressure could not be accurately estimated. - Pericardium, extracardiac: There was no pericardial effusion.  Impressions:  - There is an intermittently seen echodensity seen on the atrial side of the anterior mitral leaflet concerning for vegetation in patient with history of fever and  stroke.   07/2017 TEE Study Conclusions  - Left ventricle: Systolic function was normal. The estimated ejection fraction was in the range of 55% to 60%. Wall motion was normal; there were no regional wall motion abnormalities. - Aortic valve: Small, freely mobile echodensity seen intermittently on the ventricular side of the right coronary aortic cusp suggestive of vegetation given clinical scenario. There was moderate stenosis. Valve area approximately 1.4 cm2 by planimetry. There was mild regurgitation. - Aorta: Moderate to severe diffuse atherosclerosis present. - Mitral valve: Very small, freely mobile echodensity on the atrial side of the posterior mitral leaflet suggestive of vegetation given clinical scenario. The larger abnormality in region of anterior leaflet noted on the transthoracic study is not present - perhaps more reflective of artifact. Mildly calcified annulus. There was mild regurgitation. - Left atrium: The atrium was dilated. No evidence of thrombus in the atrial cavity or appendage. Emptying velocity was normal. - Right atrium: No evidence of thrombus in the atrial cavity or appendage. - Atrial septum: No significant defect or patent foramen ovale was identified by color Doppler or agitated saline injection. - Tricuspid valve: There was trivial regurgitation. - Pulmonic valve: There was mild regurgitation. - Pericardium, extracardiac: There was no pericardial effusion.  Impressions:  - Very small, freely mobile echodensity on the atrial side of the posterior mitral leaflet suggestive of vegetation given clinical scenario. The larger abnormality in region of anterior leaflet noted on the transthoracic study is not present - perhaps more reflective of artifact. There is also a small, freely mobile echodensity seen intermittently on the ventricular side of the right coronary aortic cusp suggestive of vegetation  given clinical scenario. No significant defect or patent foramen ovale was identified by color Doppler or agitated saline injection   10/2017 echo Study Conclusions  - Left ventricle: The cavity size was normal. Wall thickness was increased in a pattern of mild LVH. Systolic function was normal. The estimated ejection fraction was in the range of 55% to 60%. Wall motion was normal; there were no regional wall motion abnormalities. Doppler parameters are consistent with abnormal left ventricular relaxation (grade 1 diastolic dysfunction). - Aortic valve: Moderately calcified annulus. Probably trileaflet; mildly calcified leaflets. Valve mobility was restricted. There was moderate stenosis. There was mild regurgitation. Mean gradient (S): 12 mm Hg. VTI ratio of LVOT to aortic valve: 0.37. Valve area (VTI): 1.27 cm^2. - Mitral valve: Mildly calcified annulus. Mildly thickened, mildly calcified leaflets. There was mild regurgitation. - Right atrium: Central venous pressure (est): 3 mm Hg. - Atrial septum: No defect or patent foramen ovale was identified. - Tricuspid valve: There was trivial regurgitation. - Pulmonary arteries: PA peak pressure: 19 mm Hg (S). - Pericardium, extracardiac: There was no pericardial effusion.  Impressions:  - No definitive valvular vegetations noted.   11/2018 cath  Prox RCA lesion is 70% stenosed.  Ost RCA to Prox RCA lesion is 60% stenosed.  Mid LM lesion is 20% stenosed.  Prox LAD lesion is 85% stenosed.  1st Diag lesion is 50% stenosed.  Ost Cx to Prox Cx lesion is 90% stenosed.  1st Mrg lesion is 95% stenosed.  Mid Cx lesion is 80% stenosed.  Mid Cx to Dist Cx lesion is 90% stenosed.  Dist Cx lesion is 70% stenosed.   Severe multivessel CAD with 20% distal left main narrowing; 85% proximal LAD stenosis before the first diagonal vessel with 50% ostial stenosis in this diagonal; 90% proximal left circumflex  coronary artery with 95% ostial stenosis in the first marginal Mukhtar Shams with segmental 80%, 90% and 70% mid distal circumflex stenoses; and 60% smooth proximal RCA narrowing with evidence for eccentric calcified irregular 70% proximal stenosis.  Mild aortic valve stenosis with mean gradient 5.4 mmHg.  Post cath RECOMMENDATION: Findings were discussed with Dr. [name] upon completion of the study.  The patient will be hydrated with his baseline renal insufficiency with creatinine clearance approximately 36.  The patient will see Dr. Harl Bowie for very early follow-up of the catheterization.  Consider surgical consultation for candidacy of CABG revascularization and recommend follow-up echo Doppler study for reassessment of LV systolic function and valvular architecture.  I called the patient's wife upon completion of the study. Labs/Other Tests and Data Reviewed:    EKG:  There were no vitals filed for this visit. There is no height or weight on file to calculate BMI.    Recent Labs: 12/26/2017: Magnesium 2.0 11/06/2018: BUN 22; Creatinine, Ser 1.89; Hemoglobin 15.7; Platelets 169; Potassium 3.7; Sodium 141   Recent Lipid Panel Lab Results  Component Value Date/Time   CHOL 164 07/28/2017 04:01 AM   TRIG 156 (H) 07/28/2017 04:01 AM   HDL 25 (L) 07/28/2017 04:01 AM   CHOLHDL 6.6 07/28/2017 04:01 AM   LDLCALC 108 (H) 07/28/2017 04:01 AM    Wt Readings from Last 3 Encounters:  12/19/18 200 lb (90.7 kg)  12/14/18 205 lb (93 kg)  12/06/18 200 lb (90.7 kg)     Objective:    Vital Signs:  There were no vitals filed for this visit. There is no height or weight on file to calculate BMI.  Normal affect. Normal speech pattern and tone. Comfortable, no apparent distress. No audible signs of SOB or wheezing.   ASSESSMENT & PLAN:    1. CAD with other forms of angina -recent cath as reported above. Unlikely a CABG candidate but he will have a formal evaluation by CT surgery as an  outpatient - continue current meds     COVID-19 Education: The signs and symptoms of COVID-19 were discussed with the patient and how to seek care for testing (follow up with PCP or arrange E-visit).  The importance of social distancing was discussed today.  Time:   Today, I have spent 12 minutes with the patient with telehealth technology discussing the above problems.     Medication Adjustments/Labs and Tests Ordered: Current medicines are reviewed at length with the patient today.  Concerns regarding medicines are outlined above.   Tests Ordered: No orders of the defined types were placed in this encounter.   Medication Changes: No orders of the defined types were placed in this encounter.    Merrily Pew, MD  12/25/2018 12:43 PM    [place]

## 2018-12-25 NOTE — Patient Instructions (Signed)
Medication Instructions:  Your physician recommends that you continue on your current medications as directed. Please refer to the Current Medication list given to you today.   Labwork: none  Testing/Procedures: none  Follow-Up: Your physician recommends that you schedule a follow-up appointment in: 2 months   Any Other Special Instructions Will Be Listed Below (If Applicable).  You have been referred to Cardiothoracic Surgery     If you need a refill on your cardiac medications before your next appointment, please call your pharmacy.

## 2018-12-27 ENCOUNTER — Other Ambulatory Visit: Payer: Self-pay

## 2018-12-28 ENCOUNTER — Institutional Professional Consult (permissible substitution): Payer: PPO | Admitting: Cardiothoracic Surgery

## 2018-12-28 ENCOUNTER — Encounter: Payer: Self-pay | Admitting: Cardiothoracic Surgery

## 2018-12-28 VITALS — BP 113/62 | HR 75 | Temp 97.7°F | Resp 16 | Ht 66.0 in | Wt 205.0 lb

## 2018-12-28 DIAGNOSIS — I2510 Atherosclerotic heart disease of native coronary artery without angina pectoris: Secondary | ICD-10-CM

## 2018-12-28 DIAGNOSIS — I209 Angina pectoris, unspecified: Secondary | ICD-10-CM

## 2018-12-28 NOTE — Progress Notes (Signed)
PCP is [name], MD Referring Provider is [name], MD  Chief Complaint  Patient presents with  . Coronary Artery Disease    eval for CABG,,,CATH 12/19/18, ECHO 11/07/18...last PFT 2016, CAROTID DUPLEX 2019    HPI: The patient is an 85 year old diabetic ex-smoker with unstable angina who presents for CABG evaluation after having had outpatient cardiac catheterization last week.  The patient's LV function is fairly well-preserved with LVH from hypertension and mild-moderate aortic stenosis.  He has severe coronary disease with 80% proximal LAD stenosis and 95% stenosis of a large OM1.  There is also distal disease in the OM1.  LVEDP 31 mmHg. The patient has history of COPD with poor bedside mechanics and stage III chronic kidney disease creatinine 1.9.     The patient presented in a wheelchair with evidence of kyphosis from arthritis of his spine and inability to stand and no ability to walk due to arthritis and joint disease.  Because of the patient's advanced age and debility and generalized weakness with comorbid disease of his kidneys and lungs the patient would not survive or benefit from sternotomy and CABG.  I would recommend consideration of PCI of the LAD to help this patient's ongoing angina. Past Medical History:  Diagnosis Date  . Aortic valve stenosis   . ASCVD (arteriosclerotic cardiovascular disease)    50% ostial left left anterior desending 60% mid circumflex and normal ejection fractioon in 2006 exertional dyspnea;history of chest discomfort  . Cerebrovascular disease    minimal carotid bruits  . CKD (chronic kidney disease)   . Diabetes mellitus type 2, controlled ([place])    no insulin  . Hyperlipidemia    statin intolerant  . Hypertension   . Tobacco abuse, in remission    30 pack years stopped in 1971    Past Surgical History:  Procedure Laterality Date  . APPENDECTOMY    . CARPAL TUNNEL RELEASE     surgery left 05/05/2001  . COLONOSCOPY N/A  10/01/2017   Procedure: COLONOSCOPY;  Surgeon: Jackquline Denmark, MD;  Location: [facility] ENDOSCOPY;  Service: Endoscopy;  Laterality: N/A;  . KNEE ARTHROSCOPY     bilaterial; unknown associated surgical procedure  . LEFT HEART CATH AND CORONARY ANGIOGRAPHY N/A 12/19/2018   Procedure: LEFT HEART CATH AND CORONARY ANGIOGRAPHY;  Surgeon: Troy Sine, MD;  Location: [facility];  Service: Cardiovascular;  Laterality: N/A;  . SHOULDER SURGERY     Right x2; third procedure on left  . TEE WITHOUT CARDIOVERSION N/A 07/29/2017   Procedure: TRANSESOPHAGEAL ECHOCARDIOGRAM (TEE) WITH PROPOFOL;  Surgeon: Satira Sark, MD;  Location: [facility];  Service: Cardiovascular;  Laterality: N/A;  . TONSILLECTOMY    . TRANSURETHRAL INCISION OF PROSTATE     resection of the prostate    Family History  Problem Relation Age of Onset  . Multiple sclerosis Mother   . Heart disease Father   . Peripheral vascular disease Sister     Social History Social History   Tobacco Use  . Smoking status: Former Smoker    Packs/day: 1.00    Years: 40.00    Pack years: 40.00    Types: Cigarettes    Quit date: 10/29/1969    Years since quitting: 49.1  . Smokeless tobacco: Never Used  Substance Use Topics  . Alcohol use: No    Alcohol/week: 0.0 standard drinks  . Drug use: No    Current Outpatient Medications  Medication Sig Dispense Refill  . acetaminophen (TYLENOL)  325 MG tablet Take 325-650 mg by mouth every 6 (six) hours as needed (for pain).     Marland Kitchen amLODipine (NORVASC) 5 MG tablet Take 1 tablet (5 mg total) by mouth daily. 180 tablet 3  . aspirin 81 MG tablet Take 1 tablet (81 mg total) by mouth daily. 30 tablet 0  . betaxolol (BETOPTIC-S) 0.5 % ophthalmic suspension Place 1 drop into both eyes daily.     . bimatoprost (LUMIGAN) 0.01 % SOLN Place 1 drop into both eyes at bedtime.     . brimonidine (ALPHAGAN) 0.2 % ophthalmic solution Place 1 drop into both eyes 2 (two) times a day.    . carvedilol  (COREG) 12.5 MG tablet Take 12.5 mg by mouth 2 (two) times daily with a meal.     . cetirizine (ZYRTEC) 10 MG tablet Take 10 mg by mouth at bedtime.    . dorzolamide (TRUSOPT) 2 % ophthalmic solution Place 1 drop into both eyes 2 (two) times daily.     . Fluticasone-Umeclidin-Vilant (TRELEGY ELLIPTA) 100-62.5-25 MCG/INH AEPB Inhale 1 puff into the lungs every evening.     Marland Kitchen glipiZIDE (GLUCOTROL XL) 5 MG 24 hr tablet Take 5 mg by mouth daily with breakfast.     . isosorbide mononitrate (IMDUR) 60 MG 24 hr tablet Take 1 tablet (60 mg total) by mouth 2 (two) times a day. 180 tablet 1  . JANUVIA 50 MG tablet Take 50 mg by mouth daily.    Mckinley Jewel Dimesylate (RHOPRESSA) 0.02 % SOLN Place 1 drop into the left eye at bedtime.     Marland Kitchen oxybutynin (DITROPAN) 5 MG tablet Take 5 mg by mouth daily.     . pantoprazole (PROTONIX) 40 MG tablet Take 1 tablet (40 mg total) by mouth 2 (two) times daily. 60 tablet 0  . pilocarpine (PILOCAR) 4 % ophthalmic solution Place 1 drop into the left eye 2 (two) times a day.    . polyethylene glycol (MIRALAX / GLYCOLAX) packet Take 17 g by mouth daily. 14 each 0  . potassium chloride (K-DUR) 10 MEQ tablet Take 10 mEq by mouth daily.    Marland Kitchen PROAIR HFA 108 (90 Base) MCG/ACT inhaler INHALE 2 PUFFS INTO THE LUNGS EVERY 6 HOURS AS NEEDED FOR WHEEZING ORSHORTNESS OF BREATH. 8.5 g 1  . ranolazine (RANEXA) 500 MG 12 hr tablet Take 1 tablet (500 mg total) by mouth 2 (two) times daily. 60 tablet 3  . tamsulosin (FLOMAX) 0.4 MG CAPS capsule Take 0.4 mg by mouth daily.    Marland Kitchen torsemide (DEMADEX) 10 MG tablet Take 10 mg by mouth daily.     No current facility-administered medications for this visit.     Allergies  Allergen Reactions  . Codeine Other (See Comments)    Reaction not recalled  . Ezetimibe-Simvastatin Other (See Comments)    Muscle pain  . Naproxen Other (See Comments)    Reaction not recalled  . Niacin Other (See Comments)    Reaction not recalled  . Nsaids Other  (See Comments)    Reaction not recalled  . Pravastatin Sodium Other (See Comments)    Muscle pain    Review of Systems  Gaining weight No fever No productive cough headache diarrhea or symptoms of COVID No history of DVT Positive history of mini stroke Right-hand-dominant Unable to walk uses a wheelchair at all times He has angina when he gets up at night to use bedpan for his BPH-maxed out on meds for angina  BP 113/62 (  BP Location: Left Arm, Patient Position: Sitting, Cuff Size: Normal)   Pulse 75   Temp 97.7 F (36.5 C) Comment: THERMAL  Resp 16   Ht 5\' 6"  (1.676 m)   Wt 205 lb (93 kg)   SpO2 95% Comment: ON RA  BMI 33.09 kg/m  Physical Exam Elderly obese male, hard of hearing and debilitated Lungs with distant breath sounds Heart rhythm regular with 2/6 systolic ejection murmur Abdomen obese soft without pulsatile mass Lower extremities with 2+ edema No focal motor deficit generally weak, unable to stand or support his weight  Diagnostic Tests: Coronary angiograms reviewed as well as echocardiogram showing LVH, mild-moderate left ear, severe multivessel CAD with preserved LV systolic function  Impression: Patient not a candidate for CABG because he is nonambulatory, age 81 and has significant comorbid pulmonary and renal disease.  Plan: I have contacted the cardiology card master-coordinator  to set up evaluation for PCI.   Len Childs, MD [facility] [phone]

## 2019-01-01 ENCOUNTER — Telehealth: Payer: Self-pay | Admitting: Cardiology

## 2019-01-01 DIAGNOSIS — Z961 Presence of intraocular lens: Secondary | ICD-10-CM | POA: Diagnosis not present

## 2019-01-01 DIAGNOSIS — H401123 Primary open-angle glaucoma, left eye, severe stage: Secondary | ICD-10-CM | POA: Diagnosis not present

## 2019-01-01 DIAGNOSIS — H401112 Primary open-angle glaucoma, right eye, moderate stage: Secondary | ICD-10-CM | POA: Diagnosis not present

## 2019-01-01 MED ORDER — RANOLAZINE ER 1000 MG PO TB12: 1000.0000 mg | ORAL_TABLET | Freq: Two times a day (BID) | ORAL | 11 refills | Status: AC

## 2019-01-01 NOTE — Telephone Encounter (Signed)
Can we increase his ranexa to 1000mg  bid. And update [place] later in the week on symptoms. Difficult situation with his blockages and inability to open them up, will try additional med changes to see if we can get him comfortable   Zandra Abts MD

## 2019-01-01 NOTE — Telephone Encounter (Signed)
Please call patient's daughter in regards to patient needing medication for "prolonged heart pain". / tg

## 2019-01-01 NOTE — Telephone Encounter (Signed)
Daughter states that even with Imdur 60 mg BID and Ranexa 500 mg BID, pt is still having chest pain.She states he went to [facility] and he is not a surgical candidate

## 2019-01-01 NOTE — Telephone Encounter (Signed)
New rx e-scribed to [email or web address] will update [place] later this week

## 2019-01-08 DIAGNOSIS — E1151 Type 2 diabetes mellitus with diabetic peripheral angiopathy without gangrene: Secondary | ICD-10-CM | POA: Diagnosis not present

## 2019-01-09 ENCOUNTER — Telehealth: Payer: Self-pay | Admitting: Cardiology

## 2019-01-09 NOTE — Telephone Encounter (Signed)
The patient's catheterization was done on December 18, 2017 and revealed high-grade multivessel CAD.  I never discussed stents with the patient or family and with his renal insufficiency felt the best approach was for him to follow-up with Dr. Harl Bowie and if he was a candidate for possible CABG revascularization this could be considered.  Agree with increased medical trial with his high risk anatomy.

## 2019-01-09 NOTE — Telephone Encounter (Signed)
Patient's daughter states that she has been waiting for call from [place] or Dr.Kelly to okay patient to have stents placed. Please call daughter. / tg

## 2019-01-09 NOTE — Telephone Encounter (Signed)
I personally never discussed stents with the family, Im not sure who had that discussion. At our last discussion we had increased his ranexa to see if that would help symptoms.  Given the degree of blockages a stent procedure would be quite high risk and perhaps not feasible given how bad the blockages are, certaintly high risk of renal failure even more so than the recent cath he had. I will touch base with Dr Claiborne Billings to clarify his opinion.   Zandra Abts MD

## 2019-01-09 NOTE — Telephone Encounter (Signed)
Will clarify with Dr Harl Bowie  regarding stents ?

## 2019-01-10 NOTE — Telephone Encounter (Signed)
I think our best bet is to get him as comfortable as we can with medications, there are a few more changes we could try if needed. I think its clear from evaluation from both Dr Claiborne Billings and from cardiothoracic surgery that he likely would not survive trying to open these blockages or could it could  lead to much bigger issues such as kidney failure and dialysis. I wish there was a more perfect solution but there is not, keep [place] up to date on his symptoms and we can keep working with meds   Zandra Abts MD

## 2019-01-10 NOTE — Telephone Encounter (Signed)
Spoke with daughter Marcie Bal who states that pt is having some chest pain but it is not as much as last week. She states that the meds are working.

## 2019-01-10 NOTE — Telephone Encounter (Signed)
Can we please contact family again and let them know we discussed with Dr Claiborne Billings as well and that the extend and type of blockages he has are very high risk to consider any type of stenting procedure and he recommends doing all we can with medicines. How are the symptoms doing.   Zandra Abts MD

## 2019-01-10 NOTE — Telephone Encounter (Signed)
Daughter Marcie Bal notified and voiced understanding

## 2019-01-12 ENCOUNTER — Ambulatory Visit: Payer: PPO | Admitting: Student

## 2019-01-17 DIAGNOSIS — L814 Other melanin hyperpigmentation: Secondary | ICD-10-CM | POA: Diagnosis not present

## 2019-01-17 DIAGNOSIS — L570 Actinic keratosis: Secondary | ICD-10-CM | POA: Diagnosis not present

## 2019-01-17 DIAGNOSIS — B078 Other viral warts: Secondary | ICD-10-CM | POA: Diagnosis not present

## 2019-01-17 DIAGNOSIS — L821 Other seborrheic keratosis: Secondary | ICD-10-CM | POA: Diagnosis not present

## 2019-01-17 DIAGNOSIS — L820 Inflamed seborrheic keratosis: Secondary | ICD-10-CM | POA: Diagnosis not present

## 2019-01-17 DIAGNOSIS — Z85828 Personal history of other malignant neoplasm of skin: Secondary | ICD-10-CM | POA: Diagnosis not present

## 2019-01-26 ENCOUNTER — Other Ambulatory Visit: Payer: Self-pay | Admitting: Cardiology

## 2019-02-02 ENCOUNTER — Telehealth: Payer: Self-pay

## 2019-02-02 MED ORDER — ISOSORBIDE MONONITRATE ER 60 MG PO TB24: ORAL_TABLET | ORAL | 3 refills | Status: DC

## 2019-02-02 NOTE — Telephone Encounter (Signed)
Daughter states she was told to increase father's Imdur to 120 mg BID.Pharmacy only filled 60 mg BID. Can you clarify dose? She says she was told to give 120 mg BID before we increased Ranexa to 1000 mg BID

## 2019-02-02 NOTE — Telephone Encounter (Signed)
Family states they were confused when he was on 30 mg.He is now on 60 mg BID and Ranexa 1000 mg bid

## 2019-02-02 NOTE — Telephone Encounter (Signed)
I dont' see any documentation about that. I had recommended increasing ranexa to 1000mg  bid and keeping imdur at 60mg  bid by the last phone notes and that would be my plan at this time. Higher doses of imdur may make his dizziness worst, better option is to first increase his ranexa   Zandra Abts MD

## 2019-03-01 ENCOUNTER — Ambulatory Visit: Payer: PPO | Admitting: Cardiology

## 2019-03-09 ENCOUNTER — Telehealth: Payer: Self-pay | Admitting: Cardiology

## 2019-03-09 ENCOUNTER — Ambulatory Visit: Payer: PPO | Admitting: Cardiology

## 2019-03-09 NOTE — Telephone Encounter (Signed)
Virtual Visit Pre-Appointment Phone Call  "(Name), I am calling you today to discuss your upcoming appointment. We are currently trying to limit exposure to the virus that causes COVID-19 by seeing patients at home rather than in the office."  1. "What is the BEST phone number to call the day of the visit?" - include this in appointment notes  2. Do you have or have access to (through a family member/friend) a smartphone with video capability that we can use for your visit?" a. If yes - list this number in appt notes as cell (if different from BEST phone #) and list the appointment type as a VIDEO visit in appointment notes b. If no - list the appointment type as a PHONE visit in appointment notes  3. Confirm consent - "In the setting of the current Covid19 crisis, you are scheduled for a (phone or video) visit with your provider on (date) at (time).  Just as we do with many in-office visits, in order for you to participate in this visit, we must obtain consent.  If you'd like, I can send this to your mychart (if signed up) or email for you to review.  Otherwise, I can obtain your verbal consent now.  All virtual visits are billed to your insurance company just like a normal visit would be.  By agreeing to a virtual visit, we'd like you to understand that the technology does not allow for your provider to perform an examination, and thus may limit your provider's ability to fully assess your condition. If your provider identifies any concerns that need to be evaluated in person, we will make arrangements to do so.  Finally, though the technology is pretty good, we cannot assure that it will always work on either your or our end, and in the setting of a video visit, we may have to convert it to a phone-only visit.  In either situation, we cannot ensure that we have a secure connection.  Are you willing to proceed?" STAFF: Did the patient verbally acknowledge consent to telehealth visit? Document  YES/NO here: Yes  4. Advise patient to be prepared - "Two hours prior to your appointment, go ahead and check your blood pressure, pulse, oxygen saturation, and your weight (if you have the equipment to check those) and write them all down. When your visit starts, your provider will ask you for this information. If you have an Apple Watch or Kardia device, please plan to have heart rate information ready on the day of your appointment. Please have a pen and paper handy nearby the day of the visit as well."  5. Give patient instructions for MyChart download to smartphone OR [email or web address] as below if video visit (depending on what platform provider is using)  6. Inform patient they will receive a phone call 15 minutes prior to their appointment time (may be from unknown caller ID) so they should be prepared to answer    Anthony Murray has been deemed a candidate for a follow-up tele-health visit to limit community exposure during the Covid-19 pandemic. I spoke with the patient via phone to ensure availability of phone/video source, confirm preferred email & phone number, and discuss instructions and expectations.  I reminded Anthony Murray to be prepared with any vital sign and/or heart rhythm information that could potentially be obtained via home monitoring, at the time of his visit. I reminded Anthony Murray to expect a phone call prior to  his visit.  Terry L Goins 03/09/2019 3:12 PM

## 2019-03-15 ENCOUNTER — Telehealth (INDEPENDENT_AMBULATORY_CARE_PROVIDER_SITE_OTHER): Payer: PPO | Admitting: Cardiology

## 2019-03-15 ENCOUNTER — Telehealth: Payer: Self-pay | Admitting: Cardiology

## 2019-03-15 ENCOUNTER — Encounter: Payer: Self-pay | Admitting: Cardiology

## 2019-03-15 VITALS — BP 115/66 | HR 75 | Ht 70.0 in

## 2019-03-15 DIAGNOSIS — J449 Chronic obstructive pulmonary disease, unspecified: Secondary | ICD-10-CM | POA: Diagnosis not present

## 2019-03-15 DIAGNOSIS — I25119 Atherosclerotic heart disease of native coronary artery with unspecified angina pectoris: Secondary | ICD-10-CM | POA: Diagnosis not present

## 2019-03-15 DIAGNOSIS — E1122 Type 2 diabetes mellitus with diabetic chronic kidney disease: Secondary | ICD-10-CM | POA: Diagnosis not present

## 2019-03-15 DIAGNOSIS — I350 Nonrheumatic aortic (valve) stenosis: Secondary | ICD-10-CM

## 2019-03-15 DIAGNOSIS — I25118 Atherosclerotic heart disease of native coronary artery with other forms of angina pectoris: Secondary | ICD-10-CM

## 2019-03-15 DIAGNOSIS — I10 Essential (primary) hypertension: Secondary | ICD-10-CM | POA: Diagnosis not present

## 2019-03-15 DIAGNOSIS — E782 Mixed hyperlipidemia: Secondary | ICD-10-CM

## 2019-03-15 DIAGNOSIS — R600 Localized edema: Secondary | ICD-10-CM | POA: Diagnosis not present

## 2019-03-15 DIAGNOSIS — N183 Chronic kidney disease, stage 3 (moderate): Secondary | ICD-10-CM | POA: Diagnosis not present

## 2019-03-15 DIAGNOSIS — I6523 Occlusion and stenosis of bilateral carotid arteries: Secondary | ICD-10-CM

## 2019-03-15 MED ORDER — ISOSORBIDE MONONITRATE ER 60 MG PO TB24: 90.0000 mg | ORAL_TABLET | Freq: Two times a day (BID) | ORAL | 3 refills | Status: AC

## 2019-03-15 MED ORDER — NITROGLYCERIN 0.4 MG SL SUBL: 0.4000 mg | SUBLINGUAL_TABLET | SUBLINGUAL | 3 refills | Status: AC | PRN

## 2019-03-15 MED ORDER — EZETIMIBE 10 MG PO TABS: 10.0000 mg | ORAL_TABLET | Freq: Every day | ORAL | 3 refills | Status: AC

## 2019-03-15 MED ORDER — ISOSORBIDE MONONITRATE ER 60 MG PO TB24: ORAL_TABLET | ORAL | 3 refills | Status: DC

## 2019-03-15 NOTE — Progress Notes (Signed)
Virtual Visit via Telephone Note   This visit type was conducted due to national recommendations for restrictions regarding the COVID-19 Pandemic (e.g. social distancing) in an effort to limit this patient's exposure and mitigate transmission in our community.  Due to his co-morbid illnesses, this patient is at least at moderate risk for complications without adequate follow up.  This format is felt to be most appropriate for this patient at this time.  The patient did not have access to video technology/had technical difficulties with video requiring transitioning to audio format only (telephone).  All issues noted in this document were discussed and addressed.  No physical exam could be performed with this format.  Please refer to the patient's chart for his  consent to telehealth for [facility].   Date:  03/15/2019   ID:  [name], DOB 07-04-1927, MRN [id number]  Patient Location: Home Provider Location: Home  PCP:  [name], MD  Cardiologist:  [name], MD  Electrophysiologist:  None   Evaluation Performed:  Follow-Up Visit  Chief Complaint:  Follow up visit  History of Present Illness:    LEONIDAS BOATENG is a 85 y.o. male seen today for follow up of the following medical problems.    1. CAD - cath 11/2004 with moderate non-obstructive disease - echo 09/2012 LVEF 55-60%  - over last several visits has had ongoing chest pain, we have titrated titrating and adding antianginal medical therapy.    11/2018 cath: LM 20%, prox LAD 85%, D1 50%, prox LCX 90%, mid LCX 80%, OM1 95%, RCA prox 70% - essentially surgical anatomy.  - isolated chest pain episode since cath.   - not a surgical candidate per CT surgery evaluation 12/28/18 - discussed with Dr Claiborne Billings, poor options for PCI   - chest pains somewhat up and down since last visit. Not clearly cardiac, as he can sometimes rub his stomach which improves the symptoms   2. Aortic stenosis - 08/2014 echo  LVEF 55-60%, mod to severe AS (mean grad 11, AVA0.99) 08/2015 echo LVEF 27-51%, grade I diastolic dysfunction, mean grad 10, dimensionless index 0.39, AVA VTI 0.88 - 07/2017 echo mean grad 11, dimensionless index.35, AVA VTI 0.88 - 07/2017 TEE AVA planimetry 1.44 - 10/2017 TTE mean grad 12, AVA VTI 1.27 -10/2018 TTE: AVA VTI 0.81, dimensionless index 0.36, mean grad 12. Dimensionless index and gradient would suggest LVOT is underestimated at 1.7 cm, from prior studies reported at 2.1 cm.  - no recent symptoms   2. COPD - followed by pcp   3. HL - reports statins have caused muscle aches including pravastatin. Side effects on vytorin, not clear if related to the statin portion or zetia, does not appear he has been on zetia by itself.  - LDL was around 100 last year, up to 140 by last pcp hceck  4. Carotid stenosis 07/2017 Carotid US mild RICA, LICA 70-01%    5. HTN - recent high bp's, we started norvacs 5mg  daily.  - home bp's 120s-150s/60-90s, he tends to check first thing in the morning.   6. CKDIII - followed by pcp  7. Endocarditis - small vegetations noted on MV and AV during 07/2017 admission - ID followed, he completed  4 weeks of vanc and rocephin  10/2017 echo without clear vegetations  8. CVA - ASA was increased to bid dosingfrom hospital notes - back to 81mg  daily due to recent GI bleeding.   9. GI bleed - admitted 10/03/17 -  no recent issues  SH: his wife Basilia Jumbo just passed away last month     The patient does not have symptoms concerning for COVID-19 infection (fever, chills, cough, or new shortness of breath).    Past Medical History:  Diagnosis Date  . Aortic valve stenosis   . ASCVD (arteriosclerotic cardiovascular disease)    50% ostial left left anterior desending 60% mid circumflex and normal ejection fractioon in 2006 exertional dyspnea;history of chest discomfort  . Cerebrovascular disease    minimal carotid bruits  . CKD (chronic  kidney disease)   . Diabetes mellitus type 2, controlled ([place])    no insulin  . Hyperlipidemia    statin intolerant  . Hypertension   . Tobacco abuse, in remission    30 pack years stopped in 1971   Past Surgical History:  Procedure Laterality Date  . APPENDECTOMY    . CARPAL TUNNEL RELEASE     surgery left 05/05/2001  . COLONOSCOPY N/A 10/01/2017   Procedure: COLONOSCOPY;  Surgeon: Jackquline Denmark, MD;  Location: [facility] ENDOSCOPY;  Service: Endoscopy;  Laterality: N/A;  . KNEE ARTHROSCOPY     bilaterial; unknown associated surgical procedure  . LEFT HEART CATH AND CORONARY ANGIOGRAPHY N/A 12/19/2018   Procedure: LEFT HEART CATH AND CORONARY ANGIOGRAPHY;  Surgeon: Troy Sine, MD;  Location: [facility] CV LAB;  Service: Cardiovascular;  Laterality: N/A;  . SHOULDER SURGERY     Right x2; third procedure on left  . TEE WITHOUT CARDIOVERSION N/A 07/29/2017   Procedure: TRANSESOPHAGEAL ECHOCARDIOGRAM (TEE) WITH PROPOFOL;  Surgeon: Satira Sark, MD;  Location: [facility] ENDO SUITE;  Service: Cardiovascular;  Laterality: N/A;  . TONSILLECTOMY    . TRANSURETHRAL INCISION OF PROSTATE     resection of the prostate     Current Meds  Medication Sig  . amLODipine (NORVASC) 10 MG tablet Take 10 mg by mouth daily.   Marland Kitchen aspirin 81 MG tablet Take 1 tablet (81 mg total) by mouth daily.  . betaxolol (BETOPTIC-S) 0.5 % ophthalmic suspension Place 1 drop into both eyes daily.   . bimatoprost (LUMIGAN) 0.01 % SOLN Place 1 drop into both eyes at bedtime.   . brimonidine (ALPHAGAN) 0.2 % ophthalmic solution Place 1 drop into both eyes 2 (two) times a day.  . carvedilol (COREG) 12.5 MG tablet Take 12.5 mg by mouth 2 (two) times daily with a meal.   . cetirizine (ZYRTEC) 10 MG tablet Take 10 mg by mouth at bedtime.  . dorzolamide (TRUSOPT) 2 % ophthalmic solution Place 1 drop into both eyes 2 (two) times daily.   . Fluticasone-Umeclidin-Vilant (TRELEGY ELLIPTA) 100-62.5-25 MCG/INH AEPB Inhale 1 puff into the  lungs every evening.   Marland Kitchen glipiZIDE (GLUCOTROL XL) 5 MG 24 hr tablet Take 5 mg by mouth daily with breakfast.   . isosorbide mononitrate (IMDUR) 60 MG 24 hr tablet TAKE (1) TABLET BY MOUTH TWICE DAILY.  Marland Kitchen JANUVIA 50 MG tablet Take 50 mg by mouth daily.  Mckinley Jewel Dimesylate (RHOPRESSA) 0.02 % SOLN Place 1 drop into the left eye at bedtime.   Marland Kitchen oxybutynin (DITROPAN) 5 MG tablet Take 5 mg by mouth daily.   . pantoprazole (PROTONIX) 40 MG tablet Take 1 tablet (40 mg total) by mouth 2 (two) times daily.  . pilocarpine (PILOCAR) 4 % ophthalmic solution Place 1 drop into the left eye 2 (two) times a day.  . polyethylene glycol (MIRALAX / GLYCOLAX) packet Take 17 g by mouth daily.  . potassium chloride (K-DUR)  10 MEQ tablet Take 10 mEq by mouth daily.  . ranolazine (RANEXA) 1000 MG SR tablet Take 1 tablet (1,000 mg total) by mouth 2 (two) times daily.  . tamsulosin (FLOMAX) 0.4 MG CAPS capsule Take 0.4 mg by mouth daily.  Marland Kitchen torsemide (DEMADEX) 10 MG tablet Take 10 mg by mouth daily.  . [DISCONTINUED] PROAIR HFA 108 (90 Base) MCG/ACT inhaler INHALE 2 PUFFS INTO THE LUNGS EVERY 6 HOURS AS NEEDED FOR WHEEZING ORSHORTNESS OF BREATH.     Allergies:   Codeine, Ezetimibe-simvastatin, Naproxen, Niacin, Nsaids, and Pravastatin sodium   Social History   Tobacco Use  . Smoking status: Former Smoker    Packs/day: 1.00    Years: 40.00    Pack years: 40.00    Types: Cigarettes    Quit date: 10/29/1969    Years since quitting: 49.4  . Smokeless tobacco: Never Used  Substance Use Topics  . Alcohol use: No    Alcohol/week: 0.0 standard drinks  . Drug use: No     Family Hx: The patient's family history includes Heart disease in his father; Multiple sclerosis in his mother; Peripheral vascular disease in his sister.  ROS:   Please see the history of present illness.     All other systems reviewed and are negative.   Prior CV studies:   The following studies were reviewed today:    Labs/Other  Tests and Data Reviewed:    EKG:  No ECG reviewed.  Recent Labs: 11/06/2018: BUN 22; Creatinine, Ser 1.89; Hemoglobin 15.7; Platelets 169; Potassium 3.7; Sodium 141   Recent Lipid Panel Lab Results  Component Value Date/Time   CHOL 164 07/28/2017 04:01 AM   TRIG 156 (H) 07/28/2017 04:01 AM   HDL 25 (L) 07/28/2017 04:01 AM   CHOLHDL 6.6 07/28/2017 04:01 AM   LDLCALC 108 (H) 07/28/2017 04:01 AM    Wt Readings from Last 3 Encounters:  12/28/18 205 lb (93 kg)  12/19/18 200 lb (90.7 kg)  12/14/18 205 lb (93 kg)     Objective:    Vital Signs:  BP 115/66   Pulse 75   Ht 5\' 10"  (1.778 m)   BMI 29.41 kg/m    Normal affect. Normal speech pattern and tone. COmfortable, no apparent distress. No audible signs of SOb or wheezing.  ASSESSMENT & PLAN:    1. CADwith other forms of angina -recent cath as reported above. Poor candidate for revasc based on anatomy and comorbidities - continue medical therapy, increase imdur to 90mg  bid   2. Aortic stenosis - no symptoms, continue to moniotor.   3. HL - intolerant to statins, - rising LDL, we will start zetia 10mg  daily  4. Carotid stenosis - poor candidate for any prophylactic interventions, will stop monitoring.   COVID-19 Education: The signs and symptoms of COVID-19 were discussed with the patient and how to seek care for testing (follow up with PCP or arrange E-visit).  The importance of social distancing was discussed today.  Time:   Today, I have spent 22 minutes with the patient with telehealth technology discussing the above problems.     Medication Adjustments/Labs and Tests Ordered: Current medicines are reviewed at length with the patient today.  Concerns regarding medicines are outlined above.   Tests Ordered: No orders of the defined types were placed in this encounter.   Medication Changes: No orders of the defined types were placed in this encounter.   Follow Up:  Virtual Visit in 4 month(s)  Signed,  Darran Gabay,  Roderic Palau, MD  03/15/2019 9:32 AM    [place]

## 2019-03-15 NOTE — Telephone Encounter (Signed)
Please give pt's daughter Marcie Bal a call @ [phone] Concerning pt's isosorbide mononitrate (IMDUR) 60 MG 24 hr tablet [[id number]]

## 2019-03-15 NOTE — Telephone Encounter (Signed)
Anthony Lenis, MD  Bernita Raisin, RN        He was to increase to 90mg  bid    E-scribed to [facility],daughter aware

## 2019-03-15 NOTE — Addendum Note (Signed)
Addended by: Debbora Lacrosse R on: 03/15/2019 09:52 AM   Modules accepted: Orders

## 2019-03-15 NOTE — Telephone Encounter (Signed)
Daughter says pt already taking Imdur 60 mg BID, messaged Dr.Branch

## 2019-03-15 NOTE — Patient Instructions (Signed)
Medication Instructions:  INCREASE IMDUR 60 MG - TWO TIMES DAILY   START ZETIA 10 MG DAILY     Labwork: NONE  Testing/Procedures: NONE  Follow-Up: Your physician recommends that you schedule a follow-up appointment in: 4 MONTHS    Any Other Special Instructions Will Be Listed Below (If Applicable).     If you need a refill on your cardiac medications before your next appointment, please call your pharmacy.

## 2019-03-16 ENCOUNTER — Ambulatory Visit: Payer: PPO | Admitting: Cardiology

## 2019-03-19 DIAGNOSIS — E1151 Type 2 diabetes mellitus with diabetic peripheral angiopathy without gangrene: Secondary | ICD-10-CM | POA: Diagnosis not present

## 2019-03-19 DIAGNOSIS — B351 Tinea unguium: Secondary | ICD-10-CM | POA: Diagnosis not present

## 2019-03-26 DIAGNOSIS — N1831 Chronic kidney disease, stage 3a: Secondary | ICD-10-CM | POA: Diagnosis not present

## 2019-03-26 DIAGNOSIS — S72012D Unspecified intracapsular fracture of left femur, subsequent encounter for closed fracture with routine healing: Secondary | ICD-10-CM | POA: Diagnosis not present

## 2019-03-26 DIAGNOSIS — E1151 Type 2 diabetes mellitus with diabetic peripheral angiopathy without gangrene: Secondary | ICD-10-CM | POA: Diagnosis not present

## 2019-03-26 DIAGNOSIS — Z23 Encounter for immunization: Secondary | ICD-10-CM | POA: Diagnosis not present

## 2019-03-26 DIAGNOSIS — R339 Retention of urine, unspecified: Secondary | ICD-10-CM | POA: Diagnosis not present

## 2019-03-26 DIAGNOSIS — E1165 Type 2 diabetes mellitus with hyperglycemia: Secondary | ICD-10-CM | POA: Diagnosis not present

## 2019-03-26 DIAGNOSIS — I350 Nonrheumatic aortic (valve) stenosis: Secondary | ICD-10-CM | POA: Diagnosis not present

## 2019-03-26 DIAGNOSIS — E782 Mixed hyperlipidemia: Secondary | ICD-10-CM | POA: Diagnosis not present

## 2019-03-26 DIAGNOSIS — R2689 Other abnormalities of gait and mobility: Secondary | ICD-10-CM | POA: Diagnosis not present

## 2019-03-26 DIAGNOSIS — I129 Hypertensive chronic kidney disease with stage 1 through stage 4 chronic kidney disease, or unspecified chronic kidney disease: Secondary | ICD-10-CM | POA: Diagnosis not present

## 2019-03-26 DIAGNOSIS — M19042 Primary osteoarthritis, left hand: Secondary | ICD-10-CM | POA: Diagnosis not present

## 2019-03-26 DIAGNOSIS — Z634 Disappearance and death of family member: Secondary | ICD-10-CM | POA: Diagnosis not present

## 2019-03-26 DIAGNOSIS — Z6830 Body mass index (BMI) 30.0-30.9, adult: Secondary | ICD-10-CM | POA: Diagnosis not present

## 2019-03-26 DIAGNOSIS — S72099A Other fracture of head and neck of unspecified femur, initial encounter for closed fracture: Secondary | ICD-10-CM | POA: Diagnosis not present

## 2019-03-26 DIAGNOSIS — I10 Essential (primary) hypertension: Secondary | ICD-10-CM | POA: Diagnosis not present

## 2019-03-26 DIAGNOSIS — I25119 Atherosclerotic heart disease of native coronary artery with unspecified angina pectoris: Secondary | ICD-10-CM | POA: Diagnosis not present

## 2019-03-26 DIAGNOSIS — I6529 Occlusion and stenosis of unspecified carotid artery: Secondary | ICD-10-CM | POA: Diagnosis not present

## 2019-03-26 DIAGNOSIS — R079 Chest pain, unspecified: Secondary | ICD-10-CM | POA: Diagnosis not present

## 2019-03-26 DIAGNOSIS — J449 Chronic obstructive pulmonary disease, unspecified: Secondary | ICD-10-CM | POA: Diagnosis not present

## 2019-03-26 DIAGNOSIS — R944 Abnormal results of kidney function studies: Secondary | ICD-10-CM | POA: Diagnosis not present

## 2019-03-26 DIAGNOSIS — M19041 Primary osteoarthritis, right hand: Secondary | ICD-10-CM | POA: Diagnosis not present

## 2019-03-26 DIAGNOSIS — R600 Localized edema: Secondary | ICD-10-CM | POA: Diagnosis not present

## 2019-03-26 DIAGNOSIS — R4189 Other symptoms and signs involving cognitive functions and awareness: Secondary | ICD-10-CM | POA: Diagnosis not present

## 2019-04-02 DIAGNOSIS — Z961 Presence of intraocular lens: Secondary | ICD-10-CM | POA: Diagnosis not present

## 2019-04-02 DIAGNOSIS — H401112 Primary open-angle glaucoma, right eye, moderate stage: Secondary | ICD-10-CM | POA: Diagnosis not present

## 2019-04-02 DIAGNOSIS — H401123 Primary open-angle glaucoma, left eye, severe stage: Secondary | ICD-10-CM | POA: Diagnosis not present

## 2019-04-11 DIAGNOSIS — R600 Localized edema: Secondary | ICD-10-CM | POA: Diagnosis not present

## 2019-04-11 DIAGNOSIS — N1831 Chronic kidney disease, stage 3a: Secondary | ICD-10-CM | POA: Diagnosis not present

## 2019-04-11 DIAGNOSIS — E782 Mixed hyperlipidemia: Secondary | ICD-10-CM | POA: Diagnosis not present

## 2019-04-11 DIAGNOSIS — I25119 Atherosclerotic heart disease of native coronary artery with unspecified angina pectoris: Secondary | ICD-10-CM | POA: Diagnosis not present

## 2019-04-11 DIAGNOSIS — I10 Essential (primary) hypertension: Secondary | ICD-10-CM | POA: Diagnosis not present

## 2019-04-11 DIAGNOSIS — J449 Chronic obstructive pulmonary disease, unspecified: Secondary | ICD-10-CM | POA: Diagnosis not present

## 2019-05-04 ENCOUNTER — Other Ambulatory Visit: Payer: Self-pay | Admitting: Nephrology

## 2019-05-04 ENCOUNTER — Other Ambulatory Visit (HOSPITAL_COMMUNITY): Payer: Self-pay | Admitting: Nephrology

## 2019-05-04 DIAGNOSIS — I5032 Chronic diastolic (congestive) heart failure: Secondary | ICD-10-CM | POA: Diagnosis not present

## 2019-05-04 DIAGNOSIS — R32 Unspecified urinary incontinence: Secondary | ICD-10-CM | POA: Diagnosis not present

## 2019-05-04 DIAGNOSIS — N170 Acute kidney failure with tubular necrosis: Secondary | ICD-10-CM | POA: Diagnosis not present

## 2019-05-04 DIAGNOSIS — I2510 Atherosclerotic heart disease of native coronary artery without angina pectoris: Secondary | ICD-10-CM | POA: Diagnosis not present

## 2019-05-04 DIAGNOSIS — M79604 Pain in right leg: Secondary | ICD-10-CM | POA: Diagnosis not present

## 2019-05-04 DIAGNOSIS — Z993 Dependence on wheelchair: Secondary | ICD-10-CM | POA: Diagnosis not present

## 2019-05-04 DIAGNOSIS — N184 Chronic kidney disease, stage 4 (severe): Secondary | ICD-10-CM | POA: Diagnosis not present

## 2019-05-04 DIAGNOSIS — I129 Hypertensive chronic kidney disease with stage 1 through stage 4 chronic kidney disease, or unspecified chronic kidney disease: Secondary | ICD-10-CM | POA: Diagnosis not present

## 2019-05-13 DIAGNOSIS — I469 Cardiac arrest, cause unspecified: Secondary | ICD-10-CM | POA: Diagnosis not present

## 2019-05-13 DIAGNOSIS — R404 Transient alteration of awareness: Secondary | ICD-10-CM | POA: Diagnosis not present

## 2019-05-14 ENCOUNTER — Other Ambulatory Visit: Payer: Self-pay

## 2019-05-21 ENCOUNTER — Encounter (HOSPITAL_COMMUNITY): Payer: Self-pay

## 2019-05-21 ENCOUNTER — Ambulatory Visit (HOSPITAL_COMMUNITY): Payer: PPO

## 2019-05-22 DIAGNOSIS — 419620001 Death: Secondary | SNOMED CT | POA: Diagnosis not present

## 2019-05-22 DEATH — deceased

## 2019-06-29 ENCOUNTER — Telehealth: Payer: PPO | Admitting: Cardiology
